# Patient Record
Sex: Female | Born: 1955 | Race: White | Hispanic: No | Marital: Married | Smoking: Never smoker
Health system: Southern US, Community
[De-identification: ages and names within clinical notes are randomized; demographics above are authoritative.]

## PROBLEM LIST (undated history)

## (undated) DIAGNOSIS — Z9889 Other specified postprocedural states: Secondary | ICD-10-CM

## (undated) DIAGNOSIS — Z9221 Personal history of antineoplastic chemotherapy: Secondary | ICD-10-CM

## (undated) DIAGNOSIS — Z923 Personal history of irradiation: Secondary | ICD-10-CM

## (undated) DIAGNOSIS — N840 Polyp of corpus uteri: Secondary | ICD-10-CM

## (undated) DIAGNOSIS — C801 Malignant (primary) neoplasm, unspecified: Secondary | ICD-10-CM

## (undated) DIAGNOSIS — J302 Other seasonal allergic rhinitis: Secondary | ICD-10-CM

## (undated) DIAGNOSIS — R112 Nausea with vomiting, unspecified: Secondary | ICD-10-CM

## (undated) DIAGNOSIS — N870 Mild cervical dysplasia: Secondary | ICD-10-CM

## (undated) DIAGNOSIS — F419 Anxiety disorder, unspecified: Secondary | ICD-10-CM

## (undated) HISTORY — PX: FOOT SURGERY: SHX648

## (undated) HISTORY — DX: Other seasonal allergic rhinitis: J30.2

## (undated) HISTORY — PX: CARPAL TUNNEL RELEASE: SHX101

## (undated) HISTORY — PX: BREAST LUMPECTOMY: SHX2

## (undated) HISTORY — DX: Mild cervical dysplasia: N87.0

## (undated) HISTORY — PX: TUBAL LIGATION: SHX77

## (undated) HISTORY — DX: Anxiety disorder, unspecified: F41.9

## (undated) HISTORY — PX: ECTOPIC PREGNANCY SURGERY: SHX613

## (undated) HISTORY — DX: Polyp of corpus uteri: N84.0

---

## 1997-12-08 ENCOUNTER — Inpatient Hospital Stay (HOSPITAL_COMMUNITY): Admission: AD | Admit: 1997-12-08 | Discharge: 1997-12-08 | Payer: Self-pay | Admitting: *Deleted

## 1997-12-14 ENCOUNTER — Inpatient Hospital Stay (HOSPITAL_COMMUNITY): Admission: AD | Admit: 1997-12-14 | Discharge: 1997-12-14 | Payer: Self-pay | Admitting: Obstetrics and Gynecology

## 1997-12-15 ENCOUNTER — Inpatient Hospital Stay (HOSPITAL_COMMUNITY): Admission: AD | Admit: 1997-12-15 | Discharge: 1997-12-15 | Payer: Self-pay | Admitting: Obstetrics and Gynecology

## 1997-12-21 ENCOUNTER — Ambulatory Visit (HOSPITAL_COMMUNITY): Admission: RE | Admit: 1997-12-21 | Discharge: 1997-12-21 | Payer: Self-pay | Admitting: Obstetrics and Gynecology

## 1998-01-04 ENCOUNTER — Other Ambulatory Visit: Admission: RE | Admit: 1998-01-04 | Discharge: 1998-01-04 | Payer: Self-pay | Admitting: Obstetrics and Gynecology

## 1998-05-25 ENCOUNTER — Ambulatory Visit (HOSPITAL_COMMUNITY): Admission: RE | Admit: 1998-05-25 | Discharge: 1998-05-25 | Payer: Self-pay

## 1998-12-09 ENCOUNTER — Other Ambulatory Visit: Admission: RE | Admit: 1998-12-09 | Discharge: 1998-12-09 | Payer: Self-pay | Admitting: Gynecology

## 2000-01-05 ENCOUNTER — Other Ambulatory Visit: Admission: RE | Admit: 2000-01-05 | Discharge: 2000-01-05 | Payer: Self-pay | Admitting: Obstetrics and Gynecology

## 2000-01-16 ENCOUNTER — Ambulatory Visit (HOSPITAL_COMMUNITY): Admission: RE | Admit: 2000-01-16 | Discharge: 2000-01-16 | Payer: Self-pay | Admitting: Obstetrics and Gynecology

## 2000-01-16 ENCOUNTER — Encounter: Payer: Self-pay | Admitting: Obstetrics and Gynecology

## 2000-03-19 ENCOUNTER — Other Ambulatory Visit: Admission: RE | Admit: 2000-03-19 | Discharge: 2000-03-19 | Payer: Self-pay | Admitting: Obstetrics and Gynecology

## 2001-01-21 ENCOUNTER — Other Ambulatory Visit: Admission: RE | Admit: 2001-01-21 | Discharge: 2001-01-21 | Payer: Self-pay | Admitting: Obstetrics and Gynecology

## 2001-09-03 HISTORY — PX: CERVICAL BIOPSY  W/ LOOP ELECTRODE EXCISION: SUR135

## 2002-07-15 ENCOUNTER — Other Ambulatory Visit: Admission: RE | Admit: 2002-07-15 | Discharge: 2002-07-15 | Payer: Self-pay | Admitting: Obstetrics and Gynecology

## 2002-12-03 ENCOUNTER — Other Ambulatory Visit: Admission: RE | Admit: 2002-12-03 | Discharge: 2002-12-03 | Payer: Self-pay | Admitting: Obstetrics and Gynecology

## 2003-07-07 ENCOUNTER — Ambulatory Visit (HOSPITAL_COMMUNITY): Admission: RE | Admit: 2003-07-07 | Discharge: 2003-07-07 | Payer: Self-pay | Admitting: Obstetrics and Gynecology

## 2003-11-11 ENCOUNTER — Other Ambulatory Visit: Admission: RE | Admit: 2003-11-11 | Discharge: 2003-11-11 | Payer: Self-pay | Admitting: Obstetrics and Gynecology

## 2004-11-14 ENCOUNTER — Other Ambulatory Visit: Admission: RE | Admit: 2004-11-14 | Discharge: 2004-11-14 | Payer: Self-pay | Admitting: Obstetrics and Gynecology

## 2005-12-11 ENCOUNTER — Other Ambulatory Visit: Admission: RE | Admit: 2005-12-11 | Discharge: 2005-12-11 | Payer: Self-pay | Admitting: Obstetrics and Gynecology

## 2007-01-31 ENCOUNTER — Other Ambulatory Visit: Admission: RE | Admit: 2007-01-31 | Discharge: 2007-01-31 | Payer: Self-pay | Admitting: Obstetrics and Gynecology

## 2008-07-15 ENCOUNTER — Ambulatory Visit: Payer: Self-pay | Admitting: Obstetrics and Gynecology

## 2008-07-15 ENCOUNTER — Encounter: Payer: Self-pay | Admitting: Obstetrics and Gynecology

## 2008-07-15 ENCOUNTER — Other Ambulatory Visit: Admission: RE | Admit: 2008-07-15 | Discharge: 2008-07-15 | Payer: Self-pay | Admitting: Obstetrics and Gynecology

## 2008-07-21 ENCOUNTER — Ambulatory Visit: Payer: Self-pay | Admitting: Obstetrics and Gynecology

## 2009-09-03 HISTORY — PX: DILATION AND CURETTAGE OF UTERUS: SHX78

## 2009-09-03 HISTORY — PX: HYSTEROSCOPY: SHX211

## 2009-11-02 ENCOUNTER — Other Ambulatory Visit: Admission: RE | Admit: 2009-11-02 | Discharge: 2009-11-02 | Payer: Self-pay | Admitting: Obstetrics and Gynecology

## 2009-11-02 ENCOUNTER — Ambulatory Visit: Payer: Self-pay | Admitting: Obstetrics and Gynecology

## 2009-12-26 ENCOUNTER — Ambulatory Visit: Payer: Self-pay | Admitting: Gynecology

## 2010-01-05 ENCOUNTER — Ambulatory Visit: Payer: Self-pay | Admitting: Obstetrics and Gynecology

## 2010-01-13 ENCOUNTER — Ambulatory Visit: Payer: Self-pay | Admitting: Obstetrics and Gynecology

## 2010-01-17 ENCOUNTER — Ambulatory Visit: Payer: Self-pay | Admitting: Obstetrics and Gynecology

## 2010-01-17 ENCOUNTER — Ambulatory Visit (HOSPITAL_BASED_OUTPATIENT_CLINIC_OR_DEPARTMENT_OTHER): Admission: RE | Admit: 2010-01-17 | Discharge: 2010-01-17 | Payer: Self-pay | Admitting: Obstetrics and Gynecology

## 2010-01-25 ENCOUNTER — Ambulatory Visit: Payer: Self-pay | Admitting: Obstetrics and Gynecology

## 2010-09-03 HISTORY — PX: VAGINAL HYSTERECTOMY: SUR661

## 2010-09-05 ENCOUNTER — Ambulatory Visit: Admit: 2010-09-05 | Payer: Self-pay | Admitting: Obstetrics and Gynecology

## 2010-09-11 ENCOUNTER — Encounter: Payer: Self-pay | Admitting: Family Medicine

## 2010-09-11 ENCOUNTER — Ambulatory Visit
Admission: RE | Admit: 2010-09-11 | Discharge: 2010-09-11 | Payer: Self-pay | Source: Home / Self Care | Attending: Obstetrics and Gynecology | Admitting: Obstetrics and Gynecology

## 2010-09-12 ENCOUNTER — Ambulatory Visit
Admission: RE | Admit: 2010-09-12 | Discharge: 2010-09-12 | Payer: Self-pay | Source: Home / Self Care | Attending: Obstetrics and Gynecology | Admitting: Obstetrics and Gynecology

## 2010-09-14 ENCOUNTER — Ambulatory Visit
Admission: RE | Admit: 2010-09-14 | Discharge: 2010-09-14 | Payer: Self-pay | Source: Home / Self Care | Attending: Obstetrics and Gynecology | Admitting: Obstetrics and Gynecology

## 2010-09-21 ENCOUNTER — Ambulatory Visit
Admission: RE | Admit: 2010-09-21 | Discharge: 2010-09-21 | Payer: Self-pay | Source: Home / Self Care | Attending: Obstetrics and Gynecology | Admitting: Obstetrics and Gynecology

## 2011-01-31 ENCOUNTER — Ambulatory Visit (INDEPENDENT_AMBULATORY_CARE_PROVIDER_SITE_OTHER): Payer: BC Managed Care – PPO | Admitting: Obstetrics and Gynecology

## 2011-01-31 DIAGNOSIS — N951 Menopausal and female climacteric states: Secondary | ICD-10-CM

## 2011-03-01 ENCOUNTER — Ambulatory Visit (INDEPENDENT_AMBULATORY_CARE_PROVIDER_SITE_OTHER): Payer: BC Managed Care – PPO | Admitting: Obstetrics and Gynecology

## 2011-03-01 ENCOUNTER — Other Ambulatory Visit: Payer: Self-pay | Admitting: Obstetrics and Gynecology

## 2011-03-01 DIAGNOSIS — N950 Postmenopausal bleeding: Secondary | ICD-10-CM

## 2011-03-15 ENCOUNTER — Institutional Professional Consult (permissible substitution) (INDEPENDENT_AMBULATORY_CARE_PROVIDER_SITE_OTHER): Payer: BC Managed Care – PPO | Admitting: Obstetrics and Gynecology

## 2011-03-15 DIAGNOSIS — N949 Unspecified condition associated with female genital organs and menstrual cycle: Secondary | ICD-10-CM

## 2011-03-15 DIAGNOSIS — R823 Hemoglobinuria: Secondary | ICD-10-CM

## 2011-03-15 DIAGNOSIS — N938 Other specified abnormal uterine and vaginal bleeding: Secondary | ICD-10-CM

## 2011-03-15 DIAGNOSIS — Z01818 Encounter for other preprocedural examination: Secondary | ICD-10-CM

## 2011-03-26 ENCOUNTER — Other Ambulatory Visit: Payer: Self-pay | Admitting: Obstetrics and Gynecology

## 2011-03-26 DIAGNOSIS — Z01818 Encounter for other preprocedural examination: Secondary | ICD-10-CM

## 2011-03-29 ENCOUNTER — Telehealth: Payer: Self-pay

## 2011-03-29 NOTE — Telephone Encounter (Signed)
Nurse Jasmine December at [facility] called to check if okay with Dr. Reece Agar that patient's preoperative labs were done here at [facility] on 03/15/11 and surgery is 04/03/11. She asked if he would want anything else done.  I told her since no pregnancy test ordered (pt with tubal ligation), pt. Is healthy and labs looked fine that I did think Dr. Reece Agar would be fine with that.  She said anesthesia was fine with it and did not need any other labs done. I told her that I would leave a note for him for Monday.  No need to call her back if all okay.

## 2011-04-03 ENCOUNTER — Other Ambulatory Visit: Payer: Self-pay | Admitting: Obstetrics and Gynecology

## 2011-04-03 ENCOUNTER — Ambulatory Visit (HOSPITAL_BASED_OUTPATIENT_CLINIC_OR_DEPARTMENT_OTHER)
Admission: RE | Admit: 2011-04-03 | Discharge: 2011-04-04 | Disposition: A | Discharge status: Home / Self Care | Payer: BC Managed Care – PPO | Source: Ambulatory Visit | Attending: Obstetrics and Gynecology | Admitting: Obstetrics and Gynecology

## 2011-04-03 DIAGNOSIS — N920 Excessive and frequent menstruation with regular cycle: Secondary | ICD-10-CM | POA: Insufficient documentation

## 2011-04-03 DIAGNOSIS — N8500 Endometrial hyperplasia, unspecified: Secondary | ICD-10-CM | POA: Insufficient documentation

## 2011-04-03 DIAGNOSIS — N8000 Endometriosis of the uterus, unspecified: Secondary | ICD-10-CM | POA: Insufficient documentation

## 2011-04-03 DIAGNOSIS — N921 Excessive and frequent menstruation with irregular cycle: Secondary | ICD-10-CM

## 2011-04-03 DIAGNOSIS — Z79890 Hormone replacement therapy (postmenopausal): Secondary | ICD-10-CM | POA: Insufficient documentation

## 2011-04-05 NOTE — Op Note (Signed)
  Susan Benjamin, Susan Benjamin               ACCOUNT NO.:  [id number]  MEDICAL RECORD NO.:  [id number]  LOCATION:                                 FACILITY:  PHYSICIAN:  Lashun Mccants L. Travaughn Vue, M.D.DATE OF BIRTH:  [birth year]  DATE OF PROCEDURE:  04/03/2011 DATE OF DISCHARGE:                              OPERATIVE REPORT   PREOPERATIVE DIAGNOSIS:  Menometrorrhagia, probable adenomyosis  POSTOPERATIVE DIAGNOSIS:  Menometrorrhagia, probable adenomyosis  OPERATIONS:  Vaginal hysterectomy.  SURGEON:  Craven Crean L. Eda Paschal, MD  FIRST ASSISTANT:  Timothy P. Fontaine, MD  INDICATIONS:  The patient is a gravida 5, para 2, AB 3, who had presented with severe menometrorrhagia.  She had failed all conservative attempts to stop it and at this point she went through menopause and therefore the bleeding stopped, but when she went on hormone replacement therapy, she now is having uncontrollable bleeding again.  She enters the hospital for removal of her uterus but she will preserve her ovaries and tubes after informed consent.  FINDINGS:  The uterus was slightly enlarged.  No obvious pathology was seen.  Ovaries, fallopian tubes, and pelvic peritoneum were free of disease.  PROCEDURE:  After adequate general anesthesia, the patient was placed in the dorsal lithotomy position, prepped and draped in usual sterile manner.  A 1:200,000 solution of epinephrine and 0.5% Xylocaine was injected around the cervix.  A 360-degree incision was made around the cervix.  The bladder was mobilized superiorly as was the posterior peritoneum.  The posterior peritoneum was entered with sharp dissection without trauma.  The uterosacral ligaments were clamped, cut and suture ligated with #1 chromic catgut and in doing this, they were shortened and they were sutured to the vault laterally for good vault support.  At this point, all other pedicles were handled with Super Jaw EnSeal. These included the cardinal ligaments,  uterine arteries.  At this point, the vesicouterine fold of peritoneum was sharply incised and ithen the balance of the broad ligament, utero-ovarian ligament, round ligaments and fallopian tubes were successively cauterized and cut with the Super Jaw Enseal.  The uterus was delivered, sent to Pathology for tissue diagnosis.  The vaginal cuff was whip stitched with a running locking 0 Vicryl.  Two sponge, needle, instrument counts were correct.  There was no bleeding noted.  The vaginal cuff was then closed with #1 chromic catgut incorporating peritoneum in such a fashion to eliminate the cul- de-sac.  During this procedure, Dr. Eda Paschal got stuck by a needle from the patient from the scrub nurse and therefore blood will be drawn for exposure.  The cuff was completely closed.  Copious irrigation was done once more with Ringer's lactate.  There was no bleeding noted. Estimated blood loss for entire procedure was less than 50 cc with none replaced.  The patient left the operating room in satisfactory condition draining clear urine from her Foley catheter.     Osmara Drummonds L. Eda Paschal, M.D.     Tonette Bihari  D:  04/03/2011  T:  04/03/2011  Job:  [id number]  Electronically Signed by Edyth Gunnels M.D. on 04/05/2011 11:17:10 AM

## 2011-04-05 NOTE — H&P (Signed)
NAMEARRON, MCNAUGHT               ACCOUNT NO.:  [id number]  MEDICAL RECORD NO.:  [id number]  LOCATION:                                 FACILITY:  PHYSICIAN:  Daniel L. Gottsegen, M.D.DATE OF BIRTH:  07-02-1956  DATE OF ADMISSION:  04/03/2011 DATE OF DISCHARGE:                             HISTORY & PHYSICAL   She is for surgery tomorrow at [facility].  CHIEF COMPLAINT:  Menorrhagia.  HISTORY OF PRESENT ILLNESS:  The patient is a 54 year old gravida 5, para 2-0-3-2 who has had a long several-year history of menometrorrhagia.  It is so bad that she has to stay at home when she bleeds.  It is associated with a lot of cramping.  She underwent a D and C hysteroscopy in May 2011 with removal of some small endometrial polyps, but her problem persisted in spite of that probably because of adenomyosis of the uterus.  She was really almost at the point of going ahead with hysterectomy when she started menopause and obviously this resolved her problem temporarily.  She however is having severe vasomotor symptoms and when hormone replacement therapy was started, the menometrorrhagia has recurred and as a result of this she now enters the hospital for vaginal hysterectomy.  She does not want to have her ovaries removed.  She understands the benefits and the risks. She also does have a history of simple endometrial hyperplasia, which is I think part of the issue as well.  She now enters the hospital for the above.  PAST MEDICAL HISTORY:  The patient has previously had a laparoscopic tubal.  She has also had a tubal reversal.  She had 2 ectopic pregnancies.  She had carpal tunnel surgery and she last had a D and C hysteroscopy in May 2011.  CURRENT MEDICATIONS:  Prempro.  ALLERGIES:  None.  FAMILY HISTORY:  Reveals a family history of diabetes with her mother. Her mother is also hypertensive and has had a stroke.  Her father is hypertensive as well.  Her mother has also  had coronary artery disease.  SOCIAL HISTORY:  She is a nonsmoker, nondrinker.  REVIEW OF SYSTEMS:  HEENT:  Negative.  CARDIAC:  Negative.  RESPIRATORY: Negative.  GI:  Negative.  GU:  Negative.  PHYSICAL EXAMINATION:  GENERAL:  The patient is a well-developed, well- nourished female, in no acute distress. VITAL SIGNS:  Blood pressure is 130/80, pulse is 80 and regular, respirations 16 and nonlabored. HEENT:  Within normal limits. NECK:  Supple.  Trachea in the midline.  Thyroid is not enlarged. LUNGS:  Clear to P and A. HEART:  No thrills, heaves, or murmurs. BREASTS:  No masses. ABDOMEN:  Soft without guarding, rebound or masses. PELVIC:  External is normal.  BUS is normal.  Vagina is normal.  Cervix is clean.  Pap smear shows no atypia.  Uterus is normal size and shape. Adnexa has failed to reveal masses.  Rectovaginal is confirmatory. EXTREMITIES:  Within normal limits.  ADMISSION IMPRESSION:  Menometrorrhagia, endometrial hyperplasia, adenomyosis.  PLAN:  Vaginal hysterectomy.     Daniel L. Eda Paschal, M.D.     Tonette Bihari  D:  04/02/2011  T:  04/02/2011  Job:  [id number]  cc:   [facility]  Electronically Signed by Edyth Gunnels M.D. on 04/05/2011 11:15:02 AM

## 2011-04-23 ENCOUNTER — Encounter: Payer: Self-pay | Admitting: Gynecology

## 2011-04-23 DIAGNOSIS — N840 Polyp of corpus uteri: Secondary | ICD-10-CM | POA: Insufficient documentation

## 2011-04-23 DIAGNOSIS — N870 Mild cervical dysplasia: Secondary | ICD-10-CM | POA: Insufficient documentation

## 2011-05-02 ENCOUNTER — Encounter: Payer: Self-pay | Admitting: Obstetrics and Gynecology

## 2011-05-02 ENCOUNTER — Ambulatory Visit (INDEPENDENT_AMBULATORY_CARE_PROVIDER_SITE_OTHER): Payer: BC Managed Care – PPO | Admitting: Obstetrics and Gynecology

## 2011-05-02 DIAGNOSIS — N951 Menopausal and female climacteric states: Secondary | ICD-10-CM

## 2011-05-02 DIAGNOSIS — N8003 Adenomyosis of the uterus: Secondary | ICD-10-CM | POA: Insufficient documentation

## 2011-05-02 DIAGNOSIS — N8000 Endometriosis of the uterus, unspecified: Secondary | ICD-10-CM

## 2011-05-02 DIAGNOSIS — Z780 Asymptomatic menopausal state: Secondary | ICD-10-CM

## 2011-05-02 MED ORDER — ALPRAZOLAM 0.25 MG PO TABS: 0.2500 mg | ORAL_TABLET | Freq: Every evening | ORAL | Status: AC | PRN

## 2011-05-02 NOTE — Progress Notes (Signed)
Patient came to see me today for postoperative visit. She is feeling good except for little bit of vaginal discharge. Her pathology report revealed adenomyosis.  External: Within normal limits vaginal: Cuff healing well but not 100% healed. Bimanual exam within normal limits  Assessment: Normal postoperative course.  Plan: Patient reassured. RTO one month. Patient having some irritability and she was treated with Xanax 0.25 mg one when necessary.

## 2011-05-30 ENCOUNTER — Ambulatory Visit (INDEPENDENT_AMBULATORY_CARE_PROVIDER_SITE_OTHER): Payer: BC Managed Care – PPO | Admitting: Obstetrics and Gynecology

## 2011-05-30 DIAGNOSIS — N949 Unspecified condition associated with female genital organs and menstrual cycle: Secondary | ICD-10-CM

## 2011-05-30 DIAGNOSIS — N938 Other specified abnormal uterine and vaginal bleeding: Secondary | ICD-10-CM

## 2011-05-30 NOTE — Progress Notes (Signed)
When we called the patient she came right over and we did see her on her appointment day. She is completely healed from her surgery and has no complaints. She is way overdue for mammogram however. She remains on her estrogen with good results.  External within normal limits. Vaginal examination shows 2 mm of granulation tissue. Bimanual exam is negative.  Assessment: Normal postoperative recuperation  Plan: Silver nitrate to granulation tissue. Continue estrogen. Mammogram. RTO one year recall.

## 2011-05-30 NOTE — Progress Notes (Signed)
Patient did not keep her appointment. She will be called and will be rescheduled.

## 2012-02-27 ENCOUNTER — Other Ambulatory Visit: Payer: Self-pay | Admitting: Dermatology

## 2012-03-10 ENCOUNTER — Other Ambulatory Visit: Payer: Self-pay | Admitting: Obstetrics and Gynecology

## 2012-03-10 DIAGNOSIS — Z1231 Encounter for screening mammogram for malignant neoplasm of breast: Secondary | ICD-10-CM

## 2012-04-01 ENCOUNTER — Ambulatory Visit (HOSPITAL_COMMUNITY): Payer: BC Managed Care – PPO

## 2012-04-16 ENCOUNTER — Ambulatory Visit (HOSPITAL_COMMUNITY)
Admission: RE | Admit: 2012-04-16 | Discharge: 2012-04-16 | Disposition: A | Discharge status: Home / Self Care | Payer: BC Managed Care – PPO | Source: Ambulatory Visit | Attending: Obstetrics and Gynecology | Admitting: Obstetrics and Gynecology

## 2012-04-16 DIAGNOSIS — Z1231 Encounter for screening mammogram for malignant neoplasm of breast: Secondary | ICD-10-CM | POA: Insufficient documentation

## 2012-10-10 ENCOUNTER — Ambulatory Visit (INDEPENDENT_AMBULATORY_CARE_PROVIDER_SITE_OTHER): Payer: BC Managed Care – PPO | Admitting: Women's Health

## 2012-10-10 ENCOUNTER — Encounter: Payer: Self-pay | Admitting: Women's Health

## 2012-10-10 VITALS — BP 120/70 | Ht 63.75 in | Wt 162.0 lb

## 2012-10-10 DIAGNOSIS — D126 Benign neoplasm of colon, unspecified: Secondary | ICD-10-CM

## 2012-10-10 DIAGNOSIS — N870 Mild cervical dysplasia: Secondary | ICD-10-CM

## 2012-10-10 DIAGNOSIS — Z780 Asymptomatic menopausal state: Secondary | ICD-10-CM

## 2012-10-10 DIAGNOSIS — Z13820 Encounter for screening for osteoporosis: Secondary | ICD-10-CM

## 2012-10-10 DIAGNOSIS — K635 Polyp of colon: Secondary | ICD-10-CM | POA: Insufficient documentation

## 2012-10-10 DIAGNOSIS — Z01419 Encounter for gynecological examination (general) (routine) without abnormal findings: Secondary | ICD-10-CM

## 2012-10-10 DIAGNOSIS — Z23 Encounter for immunization: Secondary | ICD-10-CM

## 2012-10-10 MED ORDER — ESTRADIOL 2 MG PO TABS: 2.0000 mg | ORAL_TABLET | Freq: Every day | ORAL | Status: DC

## 2012-10-10 MED ORDER — ALPRAZOLAM 0.25 MG PO TABS: 0.2500 mg | ORAL_TABLET | Freq: Every evening | ORAL | Status: DC | PRN

## 2012-10-10 NOTE — Patient Instructions (Addendum)
Vit D 2000 daily Schedule bone density Health Recommendations for Postmenopausal Women Based on the Results of the Women's Health Initiative [facility]) and Other Studies The WHI is a major 15-year research program to address the most common causes of death, disability and poor quality of life in postmenopausal women. Some of these causes are heart disease, cancer, bone loss (osteoporosis) and others. Taking into account all of the findings from [facility] and other studies, here are bottom-line health recommendations for women: CARDIOVASCULAR DISEASE Heart Disease: A heart attack is a medical emergency. Know the signs and symptoms of a heart attack. Hormone therapy should not be used to prevent heart disease. In women with heart disease, hormone therapy should not be used to prevent further disease. Hormone therapy increases the risk of blood clots. Below are things women can do to reduce their risk for heart disease.   Do not smoke. If you smoke, quit. Women who smoke are 2 to 6 times more likely to suffer a heart attack than non-smoking women.  Aim for a healthy weight. Being overweight causes many preventable deaths. Eat a healthy and balanced diet and drink an adequate amount of liquids.  Get moving. Make a commitment to be more physically active. Aim for 30 minutes of activity on most, if not all days of the week.  Eat for heart health. Choose a diet that is low in saturated fat, trans fat, and cholesterol. Include whole grains, vegetables, and fruits. Read the labels on the food container before buying it.  Know your numbers. Ask your caregiver to check your blood pressure, cholesterol (total, HDL, LDL, triglycerides) and blood glucose. Work with your caregiver to improve any numbers that are not normal.  High blood pressure. Limit or stop your table salt intake (try salt substitute and food seasonings), avoid salty foods and drinks. Read the labels on the food container before buying it. Avoid becoming  overweight by eating well and exercising. STROKE  Stroke is a medical emergency. Stroke can be the result of a blood clot in the blood vessel in the brain or by a brain hemorrhage (bleeding). Know the signs and symptoms of a stroke. To lower the risk of developing a stroke:  Avoid fatty foods.  Quit smoking.  Control your diabetes, blood pressure, and irregular heart rate. THROMBOPHLIBITIS (BLOOD CLOT) OF THE LEG  Hormone treatment is a big cause of developing blood clots in the leg. Becoming overweight and leading a stationary lifestyle also may contribute to developing blood clots. Controlling your diet and exercising will help lower the risk of developing blood clots. CANCER SCREENING  Breast Cancer: Women should take steps to reduce their risk of breast cancer. This includes having regular mammograms, monthly self breast exams and regular breast exams by your caregiver. Have a mammogram every one to two years if you are 56 to 56 years old. Have a mammogram annually if you are 56 years old or older depending on your risk factors. Women who are high risk for breast cancer may need more frequent mammograms. There are tests available (testing the genes in your body) if you have family history of breast cancer called BRCA 1 and 2. These tests can help determine the risks of developing breast cancer.  Intestinal or Stomach Cancer: Women should talk to their caregiver about when to start screening, what tests and how often they should be done, and the benefits and risks of doing these tests. Tests to consider are a rectal exam, fecal occult blood, sigmoidoscopy,  colononoscoby, barium enema and upper GI series of the stomach. Depending on the age, you may want to get a medical and family history of colon cancer. Women who are high risk may need to be screened at an earlier age and more often.  Cervical Cancer: A Pap test of the cervix should be done every year and every 3 years when there has been three  straight years of a normal Pap test. Women with an abnormal Pap test should be screened more often or have a cervical biopsy depending on your caregiver's recommendation.  Uterine Cancer: If you have vaginal bleeding after you are in the menopause, it should be evaluated by your caregiver.  Ovarian cancer: There are no reliable tests available to screen for ovarian cancer at this time except for yearly pelvic exams.  Lung Cancer: Yearly chest X-rays can detect lung cancer and should be done on high risk women, such as cigarette smokers and women with chronic lung disease (emphysemia).  Skin Cancer: A complete body skin exam should be done at your yearly examination. Avoid overexposure to the sun and ultraviolet light lamps. Use a strong sun block cream when in the sun. All of these things are important in lowering the risk of skin cancer. MENOPAUSE Menopause Symptoms: Hormone therapy products are effective for treating symptoms associated with menopause:  Moderate to severe hot flashes.  Night sweats.  Mood swings.  Headaches.  Tiredness.  Loss of sex drive.  Insomnia.  Other symptoms. However, hormone therapy products carry serious risks, especially in older women. Women who use or are thinking about using estrogen or estrogen with progestin treatments should discuss that with their caregiver. Your caregiver will know if the benefits outweigh the risks. The Food and Drug Administration (FDA) has concluded that hormone therapy should be used only at the lowest doses and for the shortest amount of time to reach treatment goals. It is not known at what doses there may be less risk of serious side effects. There are other treatments such as herbal medication (not controlled or regulated by the FDA), group therapy, counseling and acupuncture that may be helpful. OSTEOPOROSIS Protecting Against Bone Loss and Preventing Fracture: If hormone therapy is used for prevention of bone loss  (osteoporosis), the risks for bone loss must outweigh the risk of the therapy. Women considering taking hormone therapy for bone loss should ask their health care providers about other medications (fosamax and boniva) that are considered safe and effective for preventing bone loss and bone fractures. To guard against bone loss or fractures, it is recommended that women should take at least 1000-1500 mg of calcium and 400-800 IU of vitamin D daily in divided doses. Smoking and excessive alcohol intake increases the risk of osteoporosis. Eat foods rich in calcium and vitamin D and do weight bearing exercises several times a week as your caregiver suggests. DIABETES Diabetes Melitus: Women with Type I or Type 2 diabetes should keep their diabetes in control with diet, exercise and medication. Avoid too many sweets, starchy and fatty foods. Being overweight can affect your diabetes. COGNITION AND MEMORY Cognition and Memory: Menopausal hormone therapy is not recommended for the prevention of cognitive disorders such as Alzheimer's disease or memory loss. WHI found that women treated with hormone therapy have a greater risk of developing dementia.  DEPRESSION  Depression may occur at any age, but is common in elderly women. The reasons may be because of physical, medical, social (loneliness), financial and/or economic problems and needs. Becoming  involved with church, volunteer or social groups, seeking treatment for any physical or medical problems is recommended. Also, look into getting professional advice for any economic or financial problems. ACCIDENTS  Accidents are common and can be serious in the elderly woman. Prepare your house to prevent accidents. Eliminate throw rugs, use hip protectors, place hand bars in the bath, shower and toilet areas. Avoid wearing high heel shoes and walking on wet, snowy and icy areas. Stop driving if you have vision, hearing problems or are unsteady with you movements and  reflexes. RHEUMATOID ARTHRITIS Rheumatoid arthritis causes pain, swelling and stiffness of your bone joints. It can limit many of your activities. Over-the-counter medications may help, but prescription medications may be necessary. Talk with your caregiver about this. Exercise (walking, water aerobics), good posture, using splints on painful joints, warm baths or applying warm compresses to stiff joints and cold compresses to painful joints may be helpful. Smoking and excessive drinking may worsen the symptoms of arthritis. Seek help from a physical therapist if the arthritis is becoming a problem with your daily activities. IMMUNIZATIONS  Several immunizations are important to have during your senior years, including:   Tetanus and a diptheria shot booster every 10 years.  Influenza every year before the flu season begins.  Pneumonia vaccine.  Shingles vaccine.  Others as indicated (example: H1N1 vaccine). Document Released: 10/12/2005 Document Revised: 11/12/2011 Document Reviewed: 06/07/2008 [facility] [place] [facility] Patient Information 2013 [place], [place].

## 2012-10-10 NOTE — Progress Notes (Signed)
Susan Benjamin [birth year] [id number]    History:    The patient presents for annual exam.  Vaginal hysterectomy 2012/DUB on estradiol 2 mg daily. Had a negative polyp/ colonoscopy 2011. History of normal mammograms.  LEEP 2003 with normal Paps after.   Past medical history, past surgical history, family history and social history were all reviewed and documented in the EPIC chart. Works at an [employer] in the office.  Susan Benjamin 41 healthy,  Susan Benjamin 37 healthy, 3 grandchildren. Mother diabetes, hypertension, heart disease. Father hypertension-parents died within months of each other 2 years ago. Helps care for her sister Susan Benjamin with mild mental retardation.   ROS:  A  ROS was performed and pertinent positives and negatives are included in the history.  Exam:  Filed Vitals:   10/10/12 1438  BP: 120/70    General appearance:  Normal Head/Neck:  Normal, without cervical or supraclavicular adenopathy. Thyroid:  Symmetrical, normal in size, without palpable masses or nodularity. Respiratory  Effort:  Normal  Auscultation:  Clear without wheezing or rhonchi Cardiovascular  Auscultation:  Regular rate, without rubs, murmurs or gallops  Edema/varicosities:  Not grossly evident Abdominal  Soft,nontender, without masses, guarding or rebound.  Liver/spleen:  No organomegaly noted  Hernia:  None appreciated  Skin  Inspection:  Grossly normal  Palpation:  Grossly normal Neurologic/psychiatric  Orientation:  Normal with appropriate conversation.  Mood/affect:  Normal  Genitourinary    Breasts: Examined lying and sitting.     Right: Without masses, retractions, discharge or axillary adenopathy.     Left: Without masses, retractions, discharge or axillary adenopathy.   Inguinal/mons:  Normal without inguinal adenopathy  External genitalia:  Normal  BUS/Urethra/Skene's glands:  Normal  Bladder:  Normal  Vagina:  Normal  Cervix:  Absent  Uterus:  Absent  Adnexa/parametria:      Rt: Without masses or tenderness.   Lt: Without masses or tenderness.  Anus and perineum: Normal  Digital rectal exam: Normal sphincter tone without palpated masses or tenderness  Assessment/Plan:  57 y.o. M. WF G5 P2 for annual exam with no complaints.  Postmenopausal on estradiol Colon polyp benign 2011 Labs at primary care  Plan: Home Hemoccult card given with instructions. SBE's, continue annual mammogram, normal 04/2012, increase regular exercise, calcium rich diet, vitamin D 2000 daily encouraged. Has not had a bone density, instructed to schedule here. Estradiol 2 mg daily, prescription, reviewed risks of blood clots, strokes, breast cancer and accepts. States numerous hot flushes, poor sleep and generally not feeling well when off. Reviewed will decrease to 1 mg next year. Pap screening reviewed. Situational stressors,  Xanax 0.25 prescription given #30 with 1 refill, aware of addictive properties and uses sparingly.    Harrington Challenger [facility], 5:28 PM 10/10/2012

## 2012-10-14 ENCOUNTER — Encounter: Payer: Self-pay | Admitting: Gynecology

## 2012-11-03 ENCOUNTER — Other Ambulatory Visit: Payer: Self-pay | Admitting: Gynecology

## 2012-11-03 DIAGNOSIS — Z13820 Encounter for screening for osteoporosis: Secondary | ICD-10-CM

## 2012-12-04 ENCOUNTER — Other Ambulatory Visit: Payer: Self-pay | Admitting: Dermatology

## 2013-04-23 ENCOUNTER — Other Ambulatory Visit: Payer: Self-pay | Admitting: Dermatology

## 2013-05-11 ENCOUNTER — Other Ambulatory Visit: Payer: Self-pay

## 2013-05-11 DIAGNOSIS — Z780 Asymptomatic menopausal state: Secondary | ICD-10-CM

## 2013-05-11 MED ORDER — ALPRAZOLAM 0.25 MG PO TABS: 0.2500 mg | ORAL_TABLET | Freq: Every evening | ORAL | Status: DC | PRN

## 2013-05-11 NOTE — Telephone Encounter (Signed)
Called into pharmacy

## 2013-09-07 ENCOUNTER — Other Ambulatory Visit: Payer: Self-pay | Admitting: Women's Health

## 2013-09-07 DIAGNOSIS — Z1231 Encounter for screening mammogram for malignant neoplasm of breast: Secondary | ICD-10-CM

## 2013-09-24 ENCOUNTER — Ambulatory Visit (HOSPITAL_COMMUNITY)
Admission: RE | Admit: 2013-09-24 | Discharge: 2013-09-24 | Disposition: A | Discharge status: Home / Self Care | Payer: BC Managed Care – PPO | Source: Ambulatory Visit | Attending: Women's Health | Admitting: Women's Health

## 2013-09-24 ENCOUNTER — Ambulatory Visit (HOSPITAL_COMMUNITY)
Admission: RE | Admit: 2013-09-24 | Discharge: 2013-09-24 | Disposition: A | Discharge status: Home / Self Care | Payer: BC Managed Care – PPO | Source: Ambulatory Visit | Attending: Gynecology | Admitting: Gynecology

## 2013-09-24 DIAGNOSIS — Z1231 Encounter for screening mammogram for malignant neoplasm of breast: Secondary | ICD-10-CM | POA: Insufficient documentation

## 2013-09-24 DIAGNOSIS — Z13820 Encounter for screening for osteoporosis: Secondary | ICD-10-CM | POA: Insufficient documentation

## 2013-09-24 DIAGNOSIS — Z780 Asymptomatic menopausal state: Secondary | ICD-10-CM | POA: Insufficient documentation

## 2013-10-12 ENCOUNTER — Encounter: Payer: BC Managed Care – PPO | Admitting: Women's Health

## 2013-10-13 ENCOUNTER — Ambulatory Visit (INDEPENDENT_AMBULATORY_CARE_PROVIDER_SITE_OTHER): Payer: BC Managed Care – PPO | Admitting: Women's Health

## 2013-10-13 ENCOUNTER — Encounter: Payer: Self-pay | Admitting: Women's Health

## 2013-10-13 VITALS — BP 128/80 | Ht 63.75 in | Wt 167.4 lb

## 2013-10-13 DIAGNOSIS — Z01419 Encounter for gynecological examination (general) (routine) without abnormal findings: Secondary | ICD-10-CM

## 2013-10-13 DIAGNOSIS — Z79890 Hormone replacement therapy (postmenopausal): Secondary | ICD-10-CM

## 2013-10-13 DIAGNOSIS — N951 Menopausal and female climacteric states: Secondary | ICD-10-CM

## 2013-10-13 DIAGNOSIS — Z780 Asymptomatic menopausal state: Secondary | ICD-10-CM

## 2013-10-13 MED ORDER — ESTRADIOL 1 MG PO TABS: 1.0000 mg | ORAL_TABLET | Freq: Every day | ORAL | Status: DC

## 2013-10-13 MED ORDER — ALPRAZOLAM 0.25 MG PO TABS: 0.2500 mg | ORAL_TABLET | Freq: Every evening | ORAL | Status: DC | PRN

## 2013-10-13 NOTE — Progress Notes (Signed)
Susan Benjamin [birth year] [id number]    History:    Presents for annual exam.  2012 [facility] for DUB on estradiol 2 mg and recently stopped and is now having hot flushes, feeling anxious and poor sleep. 09/2013 DEXA  T score 1.7 at spine 0.0 hip. 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 Normal mammogram history 2011 colonoscopy negative polyp. Rare Xanax use.  Past medical history, past surgical history, family history and social history were all reviewed and documented in the EPIC chart. Works at [employer]. Daughter 4 graduating from nursing school, son 7 both doing well. Mother diabetes, hypertension, heart disease. Father hypertension and liver cancer both died 3 years ago within a month.  ROS:  A  ROS was performed and pertinent positives and negatives are included.  Exam:  Filed Vitals:   10/13/13 0848  BP: 128/80    General appearance:  Normal Thyroid:  Symmetrical, normal in size, without palpable masses or nodularity. Respiratory  Auscultation:  Clear without wheezing or rhonchi Cardiovascular  Auscultation:  Regular rate, without rubs, murmurs or gallops  Edema/varicosities:  Not grossly evident Abdominal  Soft,nontender, without masses, guarding or rebound.  Liver/spleen:  No organomegaly noted  Hernia:  None appreciated  Skin  Inspection:  Grossly normal   Breasts: Examined lying and sitting.     Right: Without masses, retractions, discharge or axillary adenopathy.     Left: Without masses, retractions, discharge or axillary adenopathy. Gentitourinary   Inguinal/mons:  Normal without inguinal adenopathy  External genitalia:  Normal  BUS/Urethra/Skene's glands:  Normal  Vagina:  Normal  Cervix: Absent  Uterus: Absent  Adnexa/parametria:     Rt: Without masses or tenderness.   Lt: Without masses or tenderness.  Anus and perineum: Normal  Digital rectal exam: Normal sphincter tone without palpated masses or tenderness  Assessment/Plan:  58 y.o.MWF G5P2   for annual exam.    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 SBE's, continue annual mammogram, 3-D tomography reviewed history of dense breast. Increase regular exercise, calcium rich diet, vitamin D 2000 daily encouraged. Xanax 0.25  occasional anxiety and rest. Aware to use sparingly. Repeat colonoscopy in one year.  Huel Cote [facility], 9:31 AM 10/13/2013

## 2013-10-13 NOTE — Patient Instructions (Signed)
Health Recommendations for Postmenopausal Women Respected and ongoing research has looked at the most common causes of death, disability, and poor quality of life in postmenopausal women. The causes include heart disease, diseases of blood vessels, diabetes, depression, cancer, and bone loss (osteoporosis). Many things can be done to help lower the chances of developing these and other common problems: CARDIOVASCULAR DISEASE Heart Disease: A heart attack is a medical emergency. Know the signs and symptoms of a heart attack. Below are things women can do to reduce their risk for heart disease.   Do not smoke. If you smoke, quit.  Aim for a healthy weight. Being overweight causes many preventable deaths. Eat a healthy and balanced diet and drink an adequate amount of liquids.  Get moving. Make a commitment to be more physically active. Aim for 30 minutes of activity on most, if not all days of the week.  Eat for heart health. Choose a diet that is low in saturated fat and cholesterol and eliminate trans fat. Include whole grains, vegetables, and fruits. Read and understand the labels on food containers before buying.  Know your numbers. Ask your caregiver to check your blood pressure, cholesterol (total, HDL, LDL, triglycerides) and blood glucose. Work with your caregiver on improving your entire clinical picture.  High blood pressure. Limit or stop your table salt intake (try salt substitute and food seasonings). Avoid salty foods and drinks. Read labels on food containers before buying. Eating well and exercising can help control high blood pressure. STROKE  Stroke is a medical emergency. Stroke may be the result of a blood clot in a blood vessel in the brain or by a brain hemorrhage (bleeding). Know the signs and symptoms of a stroke. To lower the risk of developing a stroke:  Avoid fatty foods.  Quit smoking.  Control your diabetes, blood pressure, and irregular heart rate. THROMBOPHLEBITIS  (BLOOD CLOT) OF THE LEG  Becoming overweight and leading a stationary lifestyle may also contribute to developing blood clots. Controlling your diet and exercising will help lower the risk of developing blood clots. CANCER SCREENING  Breast Cancer: Take steps to reduce your risk of breast cancer.  You should practice "breast self-awareness." This means understanding the normal appearance and feel of your breasts and should include breast self-examination. Any changes detected, no matter how small, should be reported to your caregiver.  After age 40, you should have a clinical breast exam (CBE) every year.  Starting at age 40, you should consider having a mammogram (breast X-ray) every year.  If you have a family history of breast cancer, talk to your caregiver about genetic screening.  If you are at high risk for breast cancer, talk to your caregiver about having an MRI and a mammogram every year.  Intestinal or Stomach Cancer: Tests to consider are a rectal exam, fecal occult blood, sigmoidoscopy, and colonoscopy. Women who are high risk may need to be screened at an earlier age and more often.  Cervical Cancer:  Beginning at age 30, you should have a Pap test every 3 years as long as the past 3 Pap tests have been normal.  If you have had past treatment for cervical cancer or a condition that could lead to cancer, you need Pap tests and screening for cancer for at least 20 years after your treatment.  If you had a hysterectomy for a problem that was not cancer or a condition that could lead to cancer, then you no longer need Pap tests.    If you are between ages 65 and 70, and you have had normal Pap tests going back 10 years, you no longer need Pap tests.  If Pap tests have been discontinued, risk factors (such as a new sexual partner) need to be reassessed to determine if screening should be resumed.  Some medical problems can increase the chance of getting cervical cancer. In these  cases, your caregiver may recommend more frequent screening and Pap tests.  Uterine Cancer: If you have vaginal bleeding after reaching menopause, you should notify your caregiver.  Ovarian cancer: Other than yearly pelvic exams, there are no reliable tests available to screen for ovarian cancer at this time except for yearly pelvic exams.  Lung Cancer: Yearly chest X-rays can detect lung cancer and should be done on high risk women, such as cigarette smokers and women with chronic lung disease (emphysema).  Skin Cancer: A complete body skin exam should be done at your yearly examination. Avoid overexposure to the sun and ultraviolet light lamps. Use a strong sun block cream when in the sun. All of these things are important in lowering the risk of skin cancer. MENOPAUSE Menopause Symptoms: Hormone therapy products are effective for treating symptoms associated with menopause:  Moderate to severe hot flashes.  Night sweats.  Mood swings.  Headaches.  Tiredness.  Loss of sex drive.  Insomnia.  Other symptoms. Hormone replacement carries certain risks, especially in older women. Women who use or are thinking about using estrogen or estrogen with progestin treatments should discuss that with their caregiver. Your caregiver will help you understand the benefits and risks. The ideal dose of hormone replacement therapy is not known. The Food and Drug Administration (FDA) has concluded that hormone therapy should be used only at the lowest doses and for the shortest amount of time to reach treatment goals.  OSTEOPOROSIS Protecting Against Bone Loss and Preventing Fracture: If you use hormone therapy for prevention of bone loss (osteoporosis), the risks for bone loss must outweigh the risk of the therapy. Ask your caregiver about other medications known to be safe and effective for preventing bone loss and fractures. To guard against bone loss or fractures, the following is recommended:  If  you are less than age 50, take 1000 mg of calcium and at least 600 mg of Vitamin D per day.  If you are greater than age 50 but less than age 70, take 1200 mg of calcium and at least 600 mg of Vitamin D per day.  If you are greater than age 70, take 1200 mg of calcium and at least 800 mg of Vitamin D per day. Smoking and excessive alcohol intake increases the risk of osteoporosis. Eat foods rich in calcium and vitamin D and do weight bearing exercises several times a week as your caregiver suggests. DIABETES Diabetes Melitus: If you have Type I or Type 2 diabetes, you should keep your blood sugar under control with diet, exercise and recommended medication. Avoid too many sweets, starchy and fatty foods. Being overweight can make control more difficult. COGNITION AND MEMORY Cognition and Memory: Menopausal hormone therapy is not recommended for the prevention of cognitive disorders such as Alzheimer's disease or memory loss.  DEPRESSION  Depression may occur at any age, but is common in elderly women. The reasons may be because of physical, medical, social (loneliness), or financial problems and needs. If you are experiencing depression because of medical problems and control of symptoms, talk to your caregiver about this. Physical activity and   exercise may help with mood and sleep. Community and volunteer involvement may help your sense of value and worth. If you have depression and you feel that the problem is getting worse or becoming severe, talk to your caregiver about treatment options that are best for you. ACCIDENTS  Accidents are common and can be serious in the elderly woman. Prepare your house to prevent accidents. Eliminate throw rugs, place hand bars in the bath, shower and toilet areas. Avoid wearing high heeled shoes or walking on wet, snowy, and icy areas. Limit or stop driving if you have vision or hearing problems, or you feel you are unsteady with you movements and  reflexes. HEPATITIS C Hepatitis C is a type of viral infection affecting the liver. It is spread mainly through contact with blood from an infected person. It can be treated, but if left untreated, it can lead to severe liver damage over years. Many people who are infected do not know that the virus is in their blood. If you are a "baby-boomer", it is recommended that you have one screening test for Hepatitis C. IMMUNIZATIONS  Several immunizations are important to consider having during your senior years, including:   Tetanus, diptheria, and pertussis booster shot.  Influenza every year before the flu season begins.  Pneumonia vaccine.  Shingles vaccine.  Others as indicated based on your specific needs. Talk to your caregiver about these. Document Released: 10/12/2005 Document Revised: 08/06/2012 Document Reviewed: 06/07/2008 [employer] Patient Information 2014 [employer].  

## 2013-10-14 LAB — URINALYSIS W MICROSCOPIC + REFLEX CULTURE
BACTERIA UA: NONE SEEN
Bilirubin Urine: NEGATIVE
Casts: NONE SEEN
Crystals: NONE SEEN
Glucose, UA: NEGATIVE mg/dL
HGB URINE DIPSTICK: NEGATIVE
KETONES UR: NEGATIVE mg/dL
Leukocytes, UA: NEGATIVE
NITRITE: NEGATIVE
Protein, ur: NEGATIVE mg/dL
Specific Gravity, Urine: 1.021 (ref 1.005–1.030)
Squamous Epithelial / LPF: NONE SEEN
UROBILINOGEN UA: 0.2 mg/dL (ref 0.0–1.0)
pH: 5 (ref 5.0–8.0)

## 2014-07-05 ENCOUNTER — Encounter: Payer: Self-pay | Admitting: Women's Health

## 2014-09-25 ENCOUNTER — Emergency Department (HOSPITAL_COMMUNITY): Payer: BC Managed Care – PPO

## 2014-09-25 ENCOUNTER — Emergency Department (HOSPITAL_COMMUNITY)
Admission: EM | Admit: 2014-09-25 | Discharge: 2014-09-25 | Disposition: A | Discharge status: Home / Self Care | Payer: BC Managed Care – PPO | Attending: Emergency Medicine | Admitting: Emergency Medicine

## 2014-09-25 ENCOUNTER — Encounter (HOSPITAL_COMMUNITY): Payer: Self-pay

## 2014-09-25 DIAGNOSIS — S62112A Displaced fracture of triquetrum [cuneiform] bone, left wrist, initial encounter for closed fracture: Secondary | ICD-10-CM | POA: Diagnosis not present

## 2014-09-25 DIAGNOSIS — Y9289 Other specified places as the place of occurrence of the external cause: Secondary | ICD-10-CM | POA: Diagnosis not present

## 2014-09-25 DIAGNOSIS — Y93K1 Activity, walking an animal: Secondary | ICD-10-CM | POA: Insufficient documentation

## 2014-09-25 DIAGNOSIS — Z8541 Personal history of malignant neoplasm of cervix uteri: Secondary | ICD-10-CM | POA: Insufficient documentation

## 2014-09-25 DIAGNOSIS — Z8742 Personal history of other diseases of the female genital tract: Secondary | ICD-10-CM | POA: Insufficient documentation

## 2014-09-25 DIAGNOSIS — Z79818 Long term (current) use of other agents affecting estrogen receptors and estrogen levels: Secondary | ICD-10-CM | POA: Diagnosis not present

## 2014-09-25 DIAGNOSIS — Y998 Other external cause status: Secondary | ICD-10-CM | POA: Insufficient documentation

## 2014-09-25 DIAGNOSIS — W010XXA Fall on same level from slipping, tripping and stumbling without subsequent striking against object, initial encounter: Secondary | ICD-10-CM | POA: Diagnosis not present

## 2014-09-25 DIAGNOSIS — F419 Anxiety disorder, unspecified: Secondary | ICD-10-CM | POA: Diagnosis not present

## 2014-09-25 DIAGNOSIS — S6992XA Unspecified injury of left wrist, hand and finger(s), initial encounter: Secondary | ICD-10-CM | POA: Diagnosis present

## 2014-09-25 MED ORDER — OXYCODONE-ACETAMINOPHEN 5-325 MG PO TABS
1.0000 | ORAL_TABLET | Freq: Once | ORAL | Status: AC
  Administered 2014-09-25: 1 via ORAL
  Filled 2014-09-25: qty 1

## 2014-09-25 MED ORDER — IBUPROFEN 800 MG PO TABS: 800.0000 mg | ORAL_TABLET | Freq: Three times a day (TID) | ORAL | Status: DC

## 2014-09-25 NOTE — ED Notes (Signed)
Pt returned from xray

## 2014-09-25 NOTE — Progress Notes (Signed)
Orthopedic Tech Progress Note Patient Details:  Susan Benjamin [birth year] [id number]  Ortho Devices Type of Ortho Device: Ace wrap, Volar splint Ortho Device/Splint Location: lue Ortho Device/Splint Interventions: Application   Jeron Grahn 09/25/2014, 9:49 AM

## 2014-09-25 NOTE — ED Provider Notes (Signed)
CSN: [id number]     Arrival date & time 09/25/14  2956 History   First MD Initiated Contact with Patient 09/25/14 0800     Chief Complaint  Patient presents with  . Wrist Injury     (Consider location/radiation/quality/duration/timing/severity/associated sxs/prior Treatment) HPI The patient slipped on ice this morning and fell onto her buttocks. She reports she had put her hands behind her and injured her left wrist. She denies any pain in her back or buttocks. She did not hit her head. There are no other complaints. She reports her wrist is painful with any type of movement and it has become swollen. She denies any other significant past medical history. Past Medical History  Diagnosis Date  . CIN I (cervical intraepithelial neoplasia I)   . Endometrial polyp   . Ectopic pregnancy     X 2  . Seasonal allergies   . Anxiety    Past Surgical History  Procedure Laterality Date  . Cervical biopsy  w/ loop electrode excision  2003  . Tubal ligation      tubal lig and tubal reversal  . Hysteroscopy  2011    endometrial polyp  . Dilation and curettage of uterus  2011  . Carpal tunnel release    . Ectopic pregnancy surgery      X 2-Right and Left salpingectomy  . Foot surgery    . Vaginal hysterectomy  2012   Family History  Problem Relation Age of Onset  . Diabetes Mother   . Hypertension Mother   . Stroke Mother   . Heart disease Mother   . Hypertension Father    History  Substance Use Topics  . Smoking status: Never Smoker   . Smokeless tobacco: Never Used  . Alcohol Use: Yes     Comment: occcasionally   OB History    Gravida Para Term Preterm AB TAB SAB Ectopic Multiple Living   5 2 2  3   2  2      Review of Systems Constitutional: No recent fever chills or general illness Musculoskeletal: No back or lower extremity pain.  Allergies  Review of patient's allergies indicates no known allergies.  Home Medications   Prior to Admission medications   Medication  Sig Start Date End Date Taking? Authorizing Provider  ALPRAZolam (XANAX) 0.25 MG tablet Take 1 tablet (0.25 mg total) by mouth at bedtime as needed for sleep. 10/13/13  Yes Huel Cote, NP  estradiol (ESTRACE) 1 MG tablet Take 1 tablet (1 mg total) by mouth daily. 10/13/13  Yes Huel Cote, NP  ibuprofen (ADVIL,MOTRIN) 200 MG tablet Take 400-800 mg by mouth every 6 (six) hours as needed for mild pain or moderate pain.   Yes Historical Provider, MD  loratadine (CLARITIN) 10 MG tablet Take 10 mg by mouth daily as needed for allergies.    Yes Historical Provider, MD  ibuprofen (ADVIL,MOTRIN) 800 MG tablet Take 1 tablet (800 mg total) by mouth 3 (three) times daily. 09/25/14   Charlesetta Shanks, MD   BP 114/65 mmHg  Pulse 67  Temp(Src) 97.9 F (36.6 C) (Oral)  Resp 18  Ht 5\' 4"  (1.626 m)  Wt 157 lb (71.215 kg)  BMI 26.94 kg/m2  SpO2 98% Physical Exam  Constitutional: She is oriented to person, place, and time. She appears well-developed and well-nourished. No distress.  HENT:  Head: Normocephalic and atraumatic.  Eyes: EOM are normal.  Neck: Neck supple.  No C-spine tenderness to palpation.  Cardiovascular: Intact distal  pulses.   Pulmonary/Chest: Effort normal.  Musculoskeletal:  Spine is nontender to palpation. This includes the coccyx. Mild deformity to the left wrist with ecchymosis present. Radial pulses 2+ and intact. Hand is warm and dry. Patient is able to move digits but is painful for her.  Neurological: She is alert and oriented to person, place, and time. Coordination normal.  Skin: Skin is warm and dry.  Psychiatric: She has a normal mood and affect.    ED Course  Procedures (including critical care time) Labs Review Labs Reviewed - No data to display  Imaging Review Dg Wrist Complete Left  09/25/2014   CLINICAL DATA:  Slipped on ice yesterday. Patient fell backward onto back stretched hand. Wrist painful and swelling, no laceration. Prior carpal tunnel surgery over 10  years ago.  EXAM: LEFT WRIST - COMPLETE 3+ VIEW  COMPARISON:  None.  FINDINGS: Amorphous calcifications posterior to the triquetrum on the lateral projection are indistinctly marginated but suspicious for a triquetrum fracture. Small geode or subcortical cyst laterally in the lunate. No scapholunate widening. Distal radius and distal ulna appear intact.  IMPRESSION: 1. Amorphous calcific density dorsal to the triquetrum suspicious for triquetrum fracture.   Electronically Signed   By: Sherryl Barters M.D.   On: 09/25/2014 09:01     EKG Interpretation None      MDM   Final diagnoses:  Triquetral chip fracture, left, closed, initial encounter   The patient presents as aligned above with what appears to be an avulsion type fracture to the triquetrum. The patient is neurovascularly intact. The patient is splinted and referred for outpatient orthopedic follow-up. She does not have any other identified injury and no other complaints.    Charlesetta Shanks, MD 09/25/14 [phone]

## 2014-09-25 NOTE — Discharge Instructions (Signed)
Wrist Fracture A wrist fracture is a break or crack in one of the bones of your wrist. Your wrist is made up of eight small bones at the palm of your hand (carpal bones) and two long bones that make up your forearm (radius and ulna).  CAUSES   A direct blow to the wrist.  Falling on an outstretched hand.  Trauma, such as a car accident or a fall. RISK FACTORS Risk factors for wrist fracture include:   Participating in contact and high-risk sports, such as skiing, biking, and ice skating.  Taking steroid medicines.  Smoking.  Being female.  Being Caucasian.  Drinking more than three alcoholic beverages per day.  Having low or lowered bone density (osteoporosis or osteopenia).  Age. Older adults have decreased bone density.  Women who have had menopause.  History of previous fractures. SIGNS AND SYMPTOMS Symptoms of wrist fractures include tenderness, bruising, and inflammation. Additionally, the wrist may hang in an odd position or appear deformed.  DIAGNOSIS Diagnosis may include:  Physical exam.  X-ray. TREATMENT Treatment depends on many factors, including the nature and location of the fracture, your age, and your activity level. Treatment for wrist fracture can be nonsurgical or surgical.  Nonsurgical Treatment A plaster cast or splint may be applied to your wrist if the bone is in a good position. If the fracture is not in good position, it may be necessary for your health care provider to realign it before applying a splint or cast. Usually, a cast or splint will be worn for several weeks.  Surgical Treatment Sometimes the position of the bone is so far out of place that surgery is required to apply a device to hold it together as it heals. Depending on the fracture, there are a number of options for holding the bone in place while it heals, such as a cast and metal pins.  HOME CARE INSTRUCTIONS  Keep your injured wrist elevated and move your fingers as much as  possible.  Do not put pressure on any part of your cast or splint. It may break.   Use a plastic bag to protect your cast or splint from water while bathing or showering. Do not lower your cast or splint into water.  Take medicines only as directed by your health care provider.  Keep your cast or splint clean and dry. If it becomes wet, damaged, or suddenly feels too tight, contact your health care provider right away.  Do not use any tobacco products including cigarettes, chewing tobacco, or electronic cigarettes. Tobacco can delay bone healing. If you need help quitting, ask your health care provider.  Keep all follow-up visits as directed by your health care provider. This is important.  Ask your health care provider if you should take supplements of calcium and vitamins C and D to promote bone healing. SEEK MEDICAL CARE IF:   Your cast or splint is damaged, breaks, or gets wet.  You have a fever.  You have chills.  You have continued severe pain or more swelling than you did before the cast was put on. SEEK IMMEDIATE MEDICAL CARE IF:   Your hand or fingernails on the injured arm turn blue or gray, or feel cold or numb.  You have decreased feeling in the fingers of your injured arm. MAKE SURE YOU:  Understand these instructions.  Will watch your condition.  Will get help right away if you are not doing well or get worse. Document Released: 05/30/2005 Document Revised:   01/04/2014 Document Reviewed: 09/07/2011 [employer] Patient Information 2015 [employer]. This information is not intended to replace advice given to you by your health care provider. Make sure you discuss any questions you have with your health care provider.  

## 2014-09-25 NOTE — ED Notes (Signed)
Pt slipped yesterday walking dog and c/o pain and swelling to left wrist. Good pulses. Minimal movement with fingers.

## 2014-09-25 NOTE — ED Notes (Signed)
Dr. Johnney Killian back at the bedside.

## 2014-11-17 ENCOUNTER — Emergency Department (INDEPENDENT_AMBULATORY_CARE_PROVIDER_SITE_OTHER): Payer: BC Managed Care – PPO

## 2014-11-17 ENCOUNTER — Emergency Department (INDEPENDENT_AMBULATORY_CARE_PROVIDER_SITE_OTHER)
Admission: EM | Admit: 2014-11-17 | Discharge: 2014-11-17 | Disposition: A | Discharge status: Home / Self Care | Payer: BC Managed Care – PPO | Source: Home / Self Care | Attending: Family Medicine | Admitting: Family Medicine

## 2014-11-17 ENCOUNTER — Emergency Department (INDEPENDENT_AMBULATORY_CARE_PROVIDER_SITE_OTHER)
Admission: EM | Admit: 2014-11-17 | Discharge: 2014-11-17 | Disposition: A | Discharge status: Home / Self Care | Payer: Worker's Compensation | Source: Home / Self Care | Attending: Family Medicine | Admitting: Family Medicine

## 2014-11-17 ENCOUNTER — Encounter (HOSPITAL_COMMUNITY): Payer: Self-pay | Admitting: *Deleted

## 2014-11-17 DIAGNOSIS — J040 Acute laryngitis: Secondary | ICD-10-CM

## 2014-11-17 DIAGNOSIS — R030 Elevated blood-pressure reading, without diagnosis of hypertension: Secondary | ICD-10-CM

## 2014-11-17 DIAGNOSIS — S60212A Contusion of left wrist, initial encounter: Secondary | ICD-10-CM | POA: Diagnosis not present

## 2014-11-17 DIAGNOSIS — IMO0001 Reserved for inherently not codable concepts without codable children: Secondary | ICD-10-CM

## 2014-11-17 MED ORDER — FLUTICASONE PROPIONATE 50 MCG/ACT NA SUSP: 2.0000 | Freq: Every day | NASAL | Status: DC

## 2014-11-17 MED ORDER — KETOROLAC TROMETHAMINE 30 MG/ML IJ SOLN: 30.0000 mg | Freq: Once | INTRAMUSCULAR | Status: DC

## 2014-11-17 MED ORDER — PREDNISONE 10 MG PO TABS: 30.0000 mg | ORAL_TABLET | Freq: Every day | ORAL | Status: DC

## 2014-11-17 MED ORDER — TRIAMCINOLONE ACETONIDE 40 MG/ML IJ SUSP: 40.0000 mg | Freq: Once | INTRAMUSCULAR | Status: DC

## 2014-11-17 NOTE — Discharge Instructions (Signed)
Thank you for coming in today. Call or go to the emergency room if you get worse, have trouble breathing, have chest pains, or palpitations.  Take prednisone if not better.   Laryngitis At the top of your windpipe is your voice box. It is the source of your voice. Inside your voice box are 2 bands of muscles called vocal cords. When you breathe, your vocal cords are relaxed and open so that air can get into the lungs. When you decide to say something, these cords come together and vibrate. The sound from these vibrations goes into your throat and comes out through your mouth as sound. Laryngitis is an inflammation of the vocal cords that causes hoarseness, cough, loss of voice, sore throat, and dry throat. Laryngitis can be temporary (acute) or long-term (chronic). Most cases of acute laryngitis improve with time.Chronic laryngitis lasts for more than 3 weeks. CAUSES Laryngitis can often be related to excessive smoking, talking, or yelling, as well as inhalation of toxic fumes and allergies. Acute laryngitis is usually caused by a viral infection, vocal strain, measles or mumps, or bacterial infections. Chronic laryngitis is usually caused by vocal cord strain, vocal cord injury, postnasal drip, growths on the vocal cords, or acid reflux. SYMPTOMS   Cough.  Sore throat.  Dry throat. RISK FACTORS  Respiratory infections.  Exposure to irritating substances, such as cigarette smoke, excessive amounts of alcohol, stomach acids, and workplace chemicals.  Voice trauma, such as vocal cord injury from shouting or speaking too loud. DIAGNOSIS  Your cargiver will perform a physical exam. During the physical exam, your caregiver will examine your throat. The most common sign of laryngitis is hoarseness. Laryngoscopy may be necessary to confirm the diagnosis of this condition. This procedure allows your caregiver to look into the larynx. HOME CARE INSTRUCTIONS  Drink enough fluids to keep your urine  clear or pale yellow.  Rest until you no longer have symptoms or as directed by your caregiver.  Breathe in moist air.  Take all medicine as directed by your caregiver.  Do not smoke.  Talk as little as possible (this includes whispering).  Write on paper instead of talking until your voice is back to normal.  Follow up with your caregiver if your condition has not improved after 10 days. SEEK MEDICAL CARE IF:   You have trouble breathing.  You cough up blood.  You have persistent fever.  You have increasing pain.  You have difficulty swallowing. MAKE SURE YOU:  Understand these instructions.  Will watch your condition.  Will get help right away if you are not doing well or get worse. Document Released: 08/20/2005 Document Revised: 11/12/2011 Document Reviewed: 10/26/2010 [facility] Patient Information 2015 [place], [place]. This information is not intended to replace advice given to you by your health care provider. Make sure you discuss any questions you have with your health care provider.    Hypertension Hypertension, commonly called high blood pressure, is when the force of blood pumping through your arteries is too strong. Your arteries are the blood vessels that carry blood from your heart throughout your body. A blood pressure reading consists of a higher number over a lower number, such as 110/72. The higher number (systolic) is the pressure inside your arteries when your heart pumps. The lower number (diastolic) is the pressure inside your arteries when your heart relaxes. Ideally you want your blood pressure below 120/80. Hypertension forces your heart to work harder to pump blood. Your arteries may become narrow  or stiff. Having hypertension puts you at risk for heart disease, stroke, and other problems.  RISK FACTORS Some risk factors for high blood pressure are controllable. Others are not.  Risk factors you cannot control include:   Race. You may be at higher  risk if you are African American.  Age. Risk increases with age.  Gender. Men are at higher risk than women before age 87 years. After age 70, women are at higher risk than men. Risk factors you can control include:  Not getting enough exercise or physical activity.  Being overweight.  Getting too much fat, sugar, calories, or salt in your diet.  Drinking too much alcohol. SIGNS AND SYMPTOMS Hypertension does not usually cause signs or symptoms. Extremely high blood pressure (hypertensive crisis) may cause headache, anxiety, shortness of breath, and nosebleed. DIAGNOSIS  To check if you have hypertension, your health care provider will measure your blood pressure while you are seated, with your arm held at the level of your heart. It should be measured at least twice using the same arm. Certain conditions can cause a difference in blood pressure between your right and left arms. A blood pressure reading that is higher than normal on one occasion does not mean that you need treatment. If one blood pressure reading is high, ask your health care provider about having it checked again. TREATMENT  Treating high blood pressure includes making lifestyle changes and possibly taking medicine. Living a healthy lifestyle can help lower high blood pressure. You may need to change some of your habits. Lifestyle changes may include:  Following the DASH diet. This diet is high in fruits, vegetables, and whole grains. It is low in salt, red meat, and added sugars.  Getting at least 2 hours of brisk physical activity every week.  Losing weight if necessary.  Not smoking.  Limiting alcoholic beverages.  Learning ways to reduce stress. If lifestyle changes are not enough to get your blood pressure under control, your health care provider may prescribe medicine. You may need to take more than one. Work closely with your health care provider to understand the risks and benefits. HOME CARE  INSTRUCTIONS  Have your blood pressure rechecked as directed by your health care provider.   Take medicines only as directed by your health care provider. Follow the directions carefully. Blood pressure medicines must be taken as prescribed. The medicine does not work as well when you skip doses. Skipping doses also puts you at risk for problems.   Do not smoke.   Monitor your blood pressure at home as directed by your health care provider. SEEK MEDICAL CARE IF:   You think you are having a reaction to medicines taken.  You have recurrent headaches or feel dizzy.  You have swelling in your ankles.  You have trouble with your vision. SEEK IMMEDIATE MEDICAL CARE IF:  You develop a severe headache or confusion.  You have unusual weakness, numbness, or feel faint.  You have severe chest or abdominal pain.  You vomit repeatedly.  You have trouble breathing. MAKE SURE YOU:   Understand these instructions.  Will watch your condition.  Will get help right away if you are not doing well or get worse. Document Released: 08/20/2005 Document Revised: 01/04/2014 Document Reviewed: 06/12/2013 [facility] Patient Information 2015 [place], [place]. This information is not intended to replace advice given to you by your health care provider. Make sure you discuss any questions you have with your health care provider.

## 2014-11-17 NOTE — ED Provider Notes (Signed)
Susan Benjamin is a 59 y.o. female who presents to Urgent Care today for left wrist injury. Patient tripped and fell at work today landing on her left wrist. She just got her cast off her left wrist today from a previous injury where she suffered a avulsion fracture of the left triquetrum.  She was managed by Dr. Burney Gauze.. She notes pain and swelling of the left dorsal wrist. No radiating pain weakness or numbness.  Patient also landed on her left anterior knee. This is mildly tender. No difficulty with range of motion or walking.   Past Medical History  Diagnosis Date  . CIN I (cervical intraepithelial neoplasia I)   . Endometrial polyp   . Ectopic pregnancy     X 2  . Seasonal allergies   . Anxiety    Past Surgical History  Procedure Laterality Date  . Cervical biopsy  w/ loop electrode excision  2003  . Tubal ligation      tubal lig and tubal reversal  . Hysteroscopy  2011    endometrial polyp  . Dilation and curettage of uterus  2011  . Carpal tunnel release    . Ectopic pregnancy surgery      X 2-Right and Left salpingectomy  . Foot surgery    . Vaginal hysterectomy  2012   History  Substance Use Topics  . Smoking status: Never Smoker   . Smokeless tobacco: Never Used  . Alcohol Use: Yes     Comment: rarely   ROS as above Medications: Current Facility-Administered Medications  Medication Dose Route Frequency Provider Last Rate Last Dose  . ketorolac (TORADOL) 30 MG/ML injection 30 mg  30 mg Intramuscular Once Billy Fischer, MD      . triamcinolone acetonide (KENALOG-40) injection 40 mg  40 mg Intramuscular Once Billy Fischer, MD       Current Outpatient Prescriptions  Medication Sig Dispense Refill  . ALPRAZolam (XANAX) 0.25 MG tablet Take 1 tablet (0.25 mg total) by mouth at bedtime as needed for sleep. 30 tablet 1  . estradiol (ESTRACE) 1 MG tablet Take 1 tablet (1 mg total) by mouth daily. 90 tablet 4  . ibuprofen (ADVIL,MOTRIN) 200 MG tablet Take 400-800 mg by  mouth every 6 (six) hours as needed for mild pain or moderate pain.    Marland Kitchen ibuprofen (ADVIL,MOTRIN) 800 MG tablet Take 1 tablet (800 mg total) by mouth 3 (three) times daily. 21 tablet 0  . loratadine (CLARITIN) 10 MG tablet Take 10 mg by mouth daily as needed for allergies.      No Known Allergies   Exam:  BP 184/106 mmHg  Pulse 81  Temp(Src) 97.4 F (36.3 C) (Oral)  Resp 16  SpO2 99% Gen: Well NAD Left wrist swollen and tender dorsally. Nontender in the anatomical snuff box. Grip strength is diminished. Pulses capillary refill sensation are intact. Elbow and shoulder are nontender. Left knee mild anterior contusion. Minimally tender. Full range of motion normal strength. Stable ligamentous exam.  No results found for this or any previous visit (from the past 24 hour(s)). Dg Wrist Complete Left  11/17/2014   CLINICAL DATA:  Recent triquetrum fracture ; fell earlier today  EXAM: LEFT WRIST - COMPLETE 3+ VIEW  COMPARISON:  September 25, 2014  FINDINGS: Frontal, oblique lateral, and ulnar deviation scaphoid images were obtained. The previous calcification posterior to the triquetrum has resolved. Currently there is no demonstrable fracture or dislocation. Joint spaces appear intact. No erosive change. Bones appear  mildly osteoporotic, probably due to recent prolonged immobilization from casting.  IMPRESSION: No acute fracture or dislocation.  No appreciable arthropathy.   Electronically Signed   By: Lowella Grip III M.D.   On: 11/17/2014 19:40    Assessment and Plan: 59 y.o. female with left wrist injury. Likely contusion versus strain. This is complicated by a recent previous wrist injury. Plan to use Tylenol ice rest bracing (using existing brace) and follow back up with hand surgeon.  Note blood pressure is elevated. Please see other note.  Discussed warning signs or symptoms. Please see discharge instructions. Patient expresses understanding.     Gregor Hams, MD 11/17/14 2009

## 2014-11-17 NOTE — ED Notes (Addendum)
States she tripped on a rug in the front office at [employer] @ [phone].  States this is WC.  States she landed on her hands and knees.  Abrasion to L knee.  C/o pain L wrist. It appears swollen  She broke it in January and got cast off yesterday.  Was seeing Dr. Burney Gauze.  Also c/o soreness L shoulder, L lower back.

## 2014-11-17 NOTE — ED Provider Notes (Signed)
Susan Benjamin is a 59 y.o. female who presents to Urgent Care today for cough congestion and runny nose. Symptoms present for one or 2 days. Patient also notes a hoarse voice. No chest pains palpitations or shortness of breath. She's tried some Claritin which helped a little. Patient was also seen today for unrelated Worker's Compensation injury. This is a separate note. She denies any history of hypertension.   Past Medical History  Diagnosis Date  . CIN I (cervical intraepithelial neoplasia I)   . Endometrial polyp   . Ectopic pregnancy     X 2  . Seasonal allergies   . Anxiety    Past Surgical History  Procedure Laterality Date  . Cervical biopsy  w/ loop electrode excision  2003  . Tubal ligation      tubal lig and tubal reversal  . Hysteroscopy  2011    endometrial polyp  . Dilation and curettage of uterus  2011  . Carpal tunnel release    . Ectopic pregnancy surgery      X 2-Right and Left salpingectomy  . Foot surgery    . Vaginal hysterectomy  2012   History  Substance Use Topics  . Smoking status: Never Smoker   . Smokeless tobacco: Never Used  . Alcohol Use: Yes     Comment: rarely   ROS as above Medications: No current facility-administered medications for this encounter.   Current Outpatient Prescriptions  Medication Sig Dispense Refill  . ALPRAZolam (XANAX) 0.25 MG tablet Take 1 tablet (0.25 mg total) by mouth at bedtime as needed for sleep. 30 tablet 1  . estradiol (ESTRACE) 1 MG tablet Take 1 tablet (1 mg total) by mouth daily. 90 tablet 4  . fluticasone (FLONASE) 50 MCG/ACT nasal spray Place 2 sprays into both nostrils daily. 16 g 2  . ibuprofen (ADVIL,MOTRIN) 200 MG tablet Take 400-800 mg by mouth every 6 (six) hours as needed for mild pain or moderate pain.    Marland Kitchen ibuprofen (ADVIL,MOTRIN) 800 MG tablet Take 1 tablet (800 mg total) by mouth 3 (three) times daily. 21 tablet 0  . loratadine (CLARITIN) 10 MG tablet Take 10 mg by mouth daily as needed for  allergies.     . predniSONE (DELTASONE) 10 MG tablet Take 3 tablets (30 mg total) by mouth daily. 15 tablet 0   No Known Allergies   Exam:  BP 184/106 mmHg  Pulse 81  Temp(Src) 97.4 F (36.3 C) (Oral)  Resp 16  SpO2 99% Gen: Well NAD HEENT: EOMI,  MMM clear nasal discharge with cobblestoning of the posterior pharynx. Lungs: Normal work of breathing. CTABL hoarse voice Heart: RRR no MRG Abd: NABS, Soft. Nondistended, Nontender Exts: Brisk capillary refill, warm and well perfused.   No results found for this or any previous visit (from the past 24 hour(s)). Dg Wrist Complete Left  11/17/2014   CLINICAL DATA:  Recent triquetrum fracture ; fell earlier today  EXAM: LEFT WRIST - COMPLETE 3+ VIEW  COMPARISON:  September 25, 2014  FINDINGS: Frontal, oblique lateral, and ulnar deviation scaphoid images were obtained. The previous calcification posterior to the triquetrum has resolved. Currently there is no demonstrable fracture or dislocation. Joint spaces appear intact. No erosive change. Bones appear mildly osteoporotic, probably due to recent prolonged immobilization from casting.  IMPRESSION: No acute fracture or dislocation.  No appreciable arthropathy.   Electronically Signed   By: Lowella Grip III M.D.   On: 11/17/2014 19:40    Assessment and Plan:  59 y.o. female with laryngitis and rhinitis due to seasonal allergies. Treat with Flonase nasal spray, oral antihistamines, and prednisone as needed.  She additionally has elevated blood pressure. Recommend follow-up with PCP in the near future for recheck.  Discussed warning signs or symptoms. Please see discharge instructions. Patient expresses understanding.     Gregor Hams, MD 11/17/14 2029

## 2014-11-17 NOTE — Discharge Instructions (Signed)
Thank you for coming in today. Follow up with Dr. Burney Gauze.  Return as needed.  Use the brace as needed.  Contusion A contusion is a deep bruise. Contusions are the result of an injury that caused bleeding under the skin. The contusion may turn blue, purple, or yellow. Minor injuries will give you a painless contusion, but more severe contusions may stay painful and swollen for a few weeks.  CAUSES  A contusion is usually caused by a blow, trauma, or direct force to an area of the body. SYMPTOMS   Swelling and redness of the injured area.  Bruising of the injured area.  Tenderness and soreness of the injured area.  Pain. DIAGNOSIS  The diagnosis can be made by taking a history and physical exam. An X-ray, CT scan, or MRI may be needed to determine if there were any associated injuries, such as fractures. TREATMENT  Specific treatment will depend on what area of the body was injured. In general, the best treatment for a contusion is resting, icing, elevating, and applying cold compresses to the injured area. Over-the-counter medicines may also be recommended for pain control. Ask your caregiver what the best treatment is for your contusion. HOME CARE INSTRUCTIONS   Put ice on the injured area.  Put ice in a plastic bag.  Place a towel between your skin and the bag.  Leave the ice on for 15-20 minutes, 3-4 times a day, or as directed by your health care provider.  Only take over-the-counter or prescription medicines for pain, discomfort, or fever as directed by your caregiver. Your caregiver may recommend avoiding anti-inflammatory medicines (aspirin, ibuprofen, and naproxen) for 48 hours because these medicines may increase bruising.  Rest the injured area.  If possible, elevate the injured area to reduce swelling. SEEK IMMEDIATE MEDICAL CARE IF:   You have increased bruising or swelling.  You have pain that is getting worse.  Your swelling or pain is not relieved with  medicines. MAKE SURE YOU:   Understand these instructions.  Will watch your condition.  Will get help right away if you are not doing well or get worse. Document Released: 05/30/2005 Document Revised: 08/25/2013 Document Reviewed: 06/25/2011 [facility] Patient Information 2015 [place], [place]. This information is not intended to replace advice given to you by your health care provider. Make sure you discuss any questions you have with your health care provider.

## 2014-11-17 NOTE — ED Notes (Signed)
C/o headache, sore throat, laryngitis, body aches, and cough onset yesterday.

## 2014-12-22 ENCOUNTER — Other Ambulatory Visit: Payer: Self-pay | Admitting: Women's Health

## 2014-12-22 DIAGNOSIS — Z1231 Encounter for screening mammogram for malignant neoplasm of breast: Secondary | ICD-10-CM

## 2014-12-30 ENCOUNTER — Ambulatory Visit (INDEPENDENT_AMBULATORY_CARE_PROVIDER_SITE_OTHER): Payer: BC Managed Care – PPO | Admitting: Women's Health

## 2014-12-30 ENCOUNTER — Encounter: Payer: Self-pay | Admitting: Women's Health

## 2014-12-30 VITALS — BP 140/80 | Ht 63.0 in | Wt 169.0 lb

## 2014-12-30 DIAGNOSIS — Z01419 Encounter for gynecological examination (general) (routine) without abnormal findings: Secondary | ICD-10-CM

## 2014-12-30 DIAGNOSIS — Z79890 Hormone replacement therapy (postmenopausal): Secondary | ICD-10-CM

## 2014-12-30 MED ORDER — ESTRADIOL 0.5 MG PO TABS: 0.5000 mg | ORAL_TABLET | Freq: Every day | ORAL | Status: DC

## 2014-12-30 NOTE — Patient Instructions (Signed)
Health Recommendations for Postmenopausal Women Respected and ongoing research has looked at the most common causes of death, disability, and poor quality of life in postmenopausal women. The causes include heart disease, diseases of blood vessels, diabetes, depression, cancer, and bone loss (osteoporosis). Many things can be done to help lower the chances of developing these and other common problems. CARDIOVASCULAR DISEASE Heart Disease: A heart attack is a medical emergency. Know the signs and symptoms of a heart attack. Below are things women can do to reduce their risk for heart disease.   Do not smoke. If you smoke, quit.  Aim for a healthy weight. Being overweight causes many preventable deaths. Eat a healthy and balanced diet and drink an adequate amount of liquids.  Get moving. Make a commitment to be more physically active. Aim for 30 minutes of activity on most, if not all days of the week.  Eat for heart health. Choose a diet that is low in saturated fat and cholesterol and eliminate trans fat. Include whole grains, vegetables, and fruits. Read and understand the labels on food containers before buying.  Know your numbers. Ask your caregiver to check your blood pressure, cholesterol (total, HDL, LDL, triglycerides) and blood glucose. Work with your caregiver on improving your entire clinical picture.  High blood pressure. Limit or stop your table salt intake (try salt substitute and food seasonings). Avoid salty foods and drinks. Read labels on food containers before buying. Eating well and exercising can help control high blood pressure. STROKE  Stroke is a medical emergency. Stroke may be the result of a blood clot in a blood vessel in the brain or by a brain hemorrhage (bleeding). Know the signs and symptoms of a stroke. To lower the risk of developing a stroke:  Avoid fatty foods.  Quit smoking.  Control your diabetes, blood pressure, and irregular heart rate. THROMBOPHLEBITIS  (BLOOD CLOT) OF THE LEG  Becoming overweight and leading a stationary lifestyle may also contribute to developing blood clots. Controlling your diet and exercising will help lower the risk of developing blood clots. CANCER SCREENING  Breast Cancer: Take steps to reduce your risk of breast cancer.  You should practice "breast self-awareness." This means understanding the normal appearance and feel of your breasts and should include breast self-examination. Any changes detected, no matter how small, should be reported to your caregiver.  After age 40, you should have a clinical breast exam (CBE) every year.  Starting at age 40, you should consider having a mammogram (breast X-ray) every year.  If you have a family history of breast cancer, talk to your caregiver about genetic screening.  If you are at high risk for breast cancer, talk to your caregiver about having an MRI and a mammogram every year.  Intestinal or Stomach Cancer: Tests to consider are a rectal exam, fecal occult blood, sigmoidoscopy, and colonoscopy. Women who are high risk may need to be screened at an earlier age and more often.  Cervical Cancer:  Beginning at age 30, you should have a Pap test every 3 years as long as the past 3 Pap tests have been normal.  If you have had past treatment for cervical cancer or a condition that could lead to cancer, you need Pap tests and screening for cancer for at least 20 years after your treatment.  If you had a hysterectomy for a problem that was not cancer or a condition that could lead to cancer, then you no longer need Pap tests.    If you are between ages 65 and 70, and you have had normal Pap tests going back 10 years, you no longer need Pap tests.  If Pap tests have been discontinued, risk factors (such as a new sexual partner) need to be reassessed to determine if screening should be resumed.  Some medical problems can increase the chance of getting cervical cancer. In these  cases, your caregiver may recommend more frequent screening and Pap tests.  Uterine Cancer: If you have vaginal bleeding after reaching menopause, you should notify your caregiver.  Ovarian Cancer: Other than yearly pelvic exams, there are no reliable tests available to screen for ovarian cancer at this time except for yearly pelvic exams.  Lung Cancer: Yearly chest X-rays can detect lung cancer and should be done on high risk women, such as cigarette smokers and women with chronic lung disease (emphysema).  Skin Cancer: A complete body skin exam should be done at your yearly examination. Avoid overexposure to the sun and ultraviolet light lamps. Use a strong sun block cream when in the sun. All of these things are important for lowering the risk of skin cancer. MENOPAUSE Menopause Symptoms: Hormone therapy products are effective for treating symptoms associated with menopause:  Moderate to severe hot flashes.  Night sweats.  Mood swings.  Headaches.  Tiredness.  Loss of sex drive.  Insomnia.  Other symptoms. Hormone replacement carries certain risks, especially in older women. Women who use or are thinking about using estrogen or estrogen with progestin treatments should discuss that with their caregiver. Your caregiver will help you understand the benefits and risks. The ideal dose of hormone replacement therapy is not known. The Food and Drug Administration (FDA) has concluded that hormone therapy should be used only at the lowest doses and for the shortest amount of time to reach treatment goals.  OSTEOPOROSIS Protecting Against Bone Loss and Preventing Fracture If you use hormone therapy for prevention of bone loss (osteoporosis), the risks for bone loss must outweigh the risk of the therapy. Ask your caregiver about other medications known to be safe and effective for preventing bone loss and fractures. To guard against bone loss or fractures, the following is recommended:  If  you are younger than age 50, take 1000 mg of calcium and at least 600 mg of Vitamin D per day.  If you are older than age 50 but younger than age 70, take 1200 mg of calcium and at least 600 mg of Vitamin D per day.  If you are older than age 70, take 1200 mg of calcium and at least 800 mg of Vitamin D per day. Smoking and excessive alcohol intake increases the risk of osteoporosis. Eat foods rich in calcium and vitamin D and do weight bearing exercises several times a week as your caregiver suggests. DIABETES Diabetes Mellitus: If you have type I or type 2 diabetes, you should keep your blood sugar under control with diet, exercise, and recommended medication. Avoid starchy and fatty foods, and too many sweets. Being overweight can make diabetes control more difficult. COGNITION AND MEMORY Cognition and Memory: Menopausal hormone therapy is not recommended for the prevention of cognitive disorders such as Alzheimer's disease or memory loss.  DEPRESSION  Depression may occur at any age, but it is common in elderly women. This may be because of physical, medical, social (loneliness), or financial problems and needs. If you are experiencing depression because of medical problems and control of symptoms, talk to your caregiver about this. Physical   activity and exercise may help with mood and sleep. Community and volunteer involvement may improve your sense of value and worth. If you have depression and you feel that the problem is getting worse or becoming severe, talk to your caregiver about which treatment options are best for you. ACCIDENTS  Accidents are common and can be serious in elderly woman. Prepare your house to prevent accidents. Eliminate throw rugs, place hand bars in bath, shower, and toilet areas. Avoid wearing high heeled shoes or walking on wet, snowy, and icy areas. Limit or stop driving if you have vision or hearing problems, or if you feel you are unsteady with your movements and  reflexes. HEPATITIS C Hepatitis C is a type of viral infection affecting the liver. It is spread mainly through contact with blood from an infected person. It can be treated, but if left untreated, it can lead to severe liver damage over the years. Many people who are infected do not know that the virus is in their blood. If you are a "baby-boomer", it is recommended that you have one screening test for Hepatitis C. IMMUNIZATIONS  Several immunizations are important to consider having during your senior years, including:   Tetanus, diphtheria, and pertussis booster shot.  Influenza every year before the flu season begins.  Pneumonia vaccine.  Shingles vaccine.  Others, as indicated based on your specific needs. Talk to your caregiver about these. Document Released: 10/12/2005 Document Revised: 01/04/2014 Document Reviewed: 06/07/2008 Exit[employer] Patient Information 2015 [employer]. This information is not intended to replace advice given to you by your health care provider. Make sure you discuss any questions you have with your health care provider.  

## 2014-12-30 NOTE — Progress Notes (Signed)
Susan Benjamin [birth year] [id number]    History:    Presents for annual exam.  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 Normal mammogram history 2015 DEXA T score 1.7 at spine, 0.0 at hip. 2011 Benign colon polyp /10 year follow-up.   Past medical history, past surgical history, family history and social history were all reviewed and documented in the EPIC chart. 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 Mother diabetes/hypertension/heart disease. Father hypertension, liver cancer both died 4 years ago within weeks of each other.  ROS:  A ROS was performed and pertinent positives and negatives are included.  Exam:  Filed Vitals:   12/30/14 1506  BP: 140/80    General appearance:  Normal Thyroid:  Symmetrical, normal in size, without palpable masses or nodularity. Respiratory  Auscultation:  Clear without wheezing or rhonchi Cardiovascular  Auscultation:  Regular rate, without rubs, murmurs or gallops  Edema/varicosities:  Not grossly evident Abdominal  Soft,nontender, without masses, guarding or rebound.  Liver/spleen:  No organomegaly noted  Hernia:  None appreciated  Skin  Inspection:  Grossly normal   Breasts: Examined lying and sitting.     Right: Without masses, retractions, discharge or axillary adenopathy.     Left: Without masses, retractions, discharge or axillary adenopathy. Gentitourinary   Inguinal/mons:  Normal without inguinal adenopathy  External genitalia:  Normal  BUS/Urethra/Skene's glands:  Normal  Vagina:  Normal  Cervix:  Absent  Uterus:  Absent  Adnexa/parametria:     Rt: Without masses or tenderness.   Lt: Without masses or tenderness.  Anus and perineum: Normal  Digital rectal exam: Normal sphincter tone without palpated masses or tenderness  Assessment/Plan:  59 y.o. MWF G5P2 for annual exam with no complaints.  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 Regular exercise, calcium rich diet,  vitamin D 1000 daily encouraged. HRT reviewed, has been on estradiol 1 mg will try  0.5 daily, prescription, proper use given and reviewed risk for blood clots, strokes, breast cancer. Will call if problems with change. Home safety, fall prevention discussed. Fractured left wrist  fall on ice this year. Reviewed blood pressure slightly elevated, continue to monitor if continues greater than 130/80 follow-up with primary care.    [place], 5:03 PM 12/30/2014

## 2014-12-31 LAB — URINALYSIS W MICROSCOPIC + REFLEX CULTURE
Bacteria, UA: NONE SEEN
Bilirubin Urine: NEGATIVE
Casts: NONE SEEN
Crystals: NONE SEEN
GLUCOSE, UA: NEGATIVE mg/dL
Hgb urine dipstick: NEGATIVE
Ketones, ur: NEGATIVE mg/dL
Leukocytes, UA: NEGATIVE
Nitrite: NEGATIVE
PROTEIN: NEGATIVE mg/dL
SPECIFIC GRAVITY, URINE: 1.023 (ref 1.005–1.030)
UROBILINOGEN UA: 0.2 mg/dL (ref 0.0–1.0)
pH: 6 (ref 5.0–8.0)

## 2015-01-05 ENCOUNTER — Ambulatory Visit (HOSPITAL_COMMUNITY)
Admission: RE | Admit: 2015-01-05 | Discharge: 2015-01-05 | Disposition: A | Discharge status: Home / Self Care | Payer: BC Managed Care – PPO | Source: Ambulatory Visit | Attending: Women's Health | Admitting: Women's Health

## 2015-01-05 DIAGNOSIS — Z1231 Encounter for screening mammogram for malignant neoplasm of breast: Secondary | ICD-10-CM | POA: Diagnosis not present

## 2016-03-19 ENCOUNTER — Other Ambulatory Visit: Payer: Self-pay | Admitting: Women's Health

## 2016-03-19 DIAGNOSIS — Z1231 Encounter for screening mammogram for malignant neoplasm of breast: Secondary | ICD-10-CM

## 2016-03-27 ENCOUNTER — Ambulatory Visit
Admission: RE | Admit: 2016-03-27 | Discharge: 2016-03-27 | Disposition: A | Discharge status: Home / Self Care | Payer: BC Managed Care – PPO | Source: Ambulatory Visit | Attending: Women's Health | Admitting: Women's Health

## 2016-03-27 DIAGNOSIS — Z1231 Encounter for screening mammogram for malignant neoplasm of breast: Secondary | ICD-10-CM

## 2016-04-25 ENCOUNTER — Encounter: Payer: Self-pay | Admitting: Women's Health

## 2016-04-25 ENCOUNTER — Ambulatory Visit (INDEPENDENT_AMBULATORY_CARE_PROVIDER_SITE_OTHER): Payer: BC Managed Care – PPO | Admitting: Women's Health

## 2016-04-25 VITALS — BP 120/76 | Ht 65.0 in | Wt 170.0 lb

## 2016-04-25 DIAGNOSIS — Z780 Asymptomatic menopausal state: Secondary | ICD-10-CM | POA: Diagnosis not present

## 2016-04-25 DIAGNOSIS — Z01419 Encounter for gynecological examination (general) (routine) without abnormal findings: Secondary | ICD-10-CM | POA: Diagnosis not present

## 2016-04-25 MED ORDER — ALPRAZOLAM 0.25 MG PO TABS: 0.2500 mg | ORAL_TABLET | Freq: Every evening | ORAL | 1 refills | Status: DC | PRN

## 2016-04-25 NOTE — Patient Instructions (Signed)

## 2016-04-25 NOTE — Progress Notes (Signed)
Susan Benjamin 12-Jul-1956 [id number]    History:    Presents for annual exam.  2012 TVH for D UPT had been on Estrace but stopped this past year. 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 Normal mammogram history. 2015 DEXA +1.7 at spine. 2011 benign colon polyp 10 year follow-up.   Past medical history, past surgical history, family history and social history were all reviewed and documented in the EPIC chart. 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 Father died from cancer, mother died from heart disease both within 3 weeks of each other. Walks daily and bikes often. 2 children and 3 grandchildren all doing well. Daughter recently graduated as a Marine scientist.  ROS:  A ROS was performed and pertinent positives and negatives are included.  Exam:  Vitals:   04/25/16 0834  BP: 120/76  Weight: 170 lb (77.1 kg)  Height: 5\' 5"  (1.651 m)   Body mass index is 28.29 kg/m.   General appearance:  Normal Thyroid:  Symmetrical, normal in size, without palpable masses or nodularity. Respiratory  Auscultation:  Clear without wheezing or rhonchi Cardiovascular  Auscultation:  Regular rate, without rubs, murmurs or gallops  Edema/varicosities:  Not grossly evident Abdominal  Soft,nontender, without masses, guarding or rebound.  Liver/spleen:  No organomegaly noted  Hernia:  None appreciated  Skin  Inspection:  Grossly normal   Breasts: Examined lying and sitting.     Right: Without masses, retractions, discharge or axillary adenopathy.     Left: Without masses, retractions, discharge or axillary adenopathy. Gentitourinary   Inguinal/mons:  Normal without inguinal adenopathy  External genitalia:  Normal  BUS/Urethra/Skene's glands:  Normal  Vagina:  Normal  Cervix: And uterus absent   Adnexa/parametria:     Rt: Without masses or tenderness.   Lt: Without masses or tenderness.  Anus and perineum: Normal  Digital rectal exam: Normal sphincter tone without palpated masses or  tenderness  Assessment/Plan:  60 y.o. MWF G2 P2 for annual exam with no complaints.  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 Continue healthy lifestyle of regular exercise, calcium rich diet, vitamin D 1000 daily encouraged. UA, Zostavax at 60 encouraged. Xanax 0.25 for occasional stress uses less than one prescription annually. Aware it is addictive.    Huel [facility], 9:13 AM 04/25/2016

## 2016-04-26 LAB — URINALYSIS W MICROSCOPIC + REFLEX CULTURE
BACTERIA UA: NONE SEEN [HPF]
Bilirubin Urine: NEGATIVE
CRYSTALS: NONE SEEN [HPF]
Casts: NONE SEEN [LPF]
Glucose, UA: NEGATIVE
Hgb urine dipstick: NEGATIVE
Ketones, ur: NEGATIVE
Nitrite: NEGATIVE
PROTEIN: NEGATIVE
RBC / HPF: NONE SEEN RBC/HPF (ref <=2)
Specific Gravity, Urine: 1.014 (ref 1.001–1.035)
Squamous Epithelial / LPF: NONE SEEN [HPF] (ref <=5)
WBC, UA: NONE SEEN WBC/HPF (ref <=5)
YEAST: NONE SEEN [HPF]
pH: 6 (ref 5.0–8.0)

## 2016-04-27 LAB — URINE CULTURE: ORGANISM ID, BACTERIA: NO GROWTH

## 2017-01-16 ENCOUNTER — Encounter: Payer: Self-pay | Admitting: Gynecology

## 2017-06-10 ENCOUNTER — Other Ambulatory Visit: Payer: Self-pay | Admitting: Women's Health

## 2017-06-10 DIAGNOSIS — Z1239 Encounter for other screening for malignant neoplasm of breast: Secondary | ICD-10-CM

## 2017-06-26 ENCOUNTER — Ambulatory Visit
Admission: RE | Admit: 2017-06-26 | Discharge: 2017-06-26 | Disposition: A | Discharge status: Home / Self Care | Payer: BC Managed Care – PPO | Source: Ambulatory Visit | Attending: Women's Health | Admitting: Women's Health

## 2017-06-26 DIAGNOSIS — Z1239 Encounter for other screening for malignant neoplasm of breast: Secondary | ICD-10-CM

## 2018-03-12 ENCOUNTER — Encounter: Payer: Self-pay | Admitting: Women's Health

## 2018-03-12 ENCOUNTER — Ambulatory Visit (INDEPENDENT_AMBULATORY_CARE_PROVIDER_SITE_OTHER): Payer: BC Managed Care – PPO | Admitting: Women's Health

## 2018-03-12 VITALS — BP 144/90 | Ht 64.5 in | Wt 175.6 lb

## 2018-03-12 DIAGNOSIS — Z01419 Encounter for gynecological examination (general) (routine) without abnormal findings: Secondary | ICD-10-CM

## 2018-03-12 DIAGNOSIS — Z13220 Encounter for screening for lipoid disorders: Secondary | ICD-10-CM | POA: Diagnosis not present

## 2018-03-12 NOTE — Progress Notes (Signed)
Susan Benjamin [birth year] [id number]    History:    Presents for annual exam.  2012 TVH for DUB on no HRT.  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  Normal mammogram history.  2015 DEXA +1.7 at spine.  2009 benign colon polyp 10-year follow-up.  Blood pressure elevated, states has had some elevations at home as well.  Past medical history, past surgical history, family history and social history were all reviewed and documented in the EPIC chart.  Control and instrumentation engineer.  Father died of lung cancer, mother died of heart disease.  2 children daughter is a Marine scientist also has 4 grandchildren all doing well.  ROS:  A ROS was performed and pertinent positives and negatives are included.  Exam:  Vitals:   03/12/18 1101  BP: (!) 144/90  Weight: 175 lb 9.6 oz (79.7 kg)  Height: 5' 4.5" (1.638 m)   Body mass index is 29.68 kg/m.   General appearance:  Normal Thyroid:  Symmetrical, normal in size, without palpable masses or nodularity. Respiratory  Auscultation:  Clear without wheezing or rhonchi Cardiovascular  Auscultation:  Regular rate, without rubs, murmurs or gallops  Edema/varicosities:  Not grossly evident Abdominal  Soft,nontender, without masses, guarding or rebound.  Liver/spleen:  No organomegaly noted  Hernia:  None appreciated  Skin  Inspection:  Grossly normal   Breasts: Examined lying and sitting.     Right: Without masses, retractions, discharge or axillary adenopathy.     Left: Without masses, retractions, discharge or axillary adenopathy. Gentitourinary   Inguinal/mons:  Normal without inguinal adenopathy  External genitalia:  Normal  BUS/Urethra/Skene's glands:  Normal  Vagina:  Normal  Cervix: And uterus absent  Adnexa/parametria:     Rt: Without masses or tenderness.   Lt: Without masses or tenderness.  Anus and perineum: Normal  Digital rectal exam: Normal sphincter tone without palpated masses or tenderness  Assessment/Plan:  62 y.o. MWF G5, P2 for annual  exam with occasional nausea only in the morning.  2012 TVH for DUB Obesity Borderline blood pressure  Plan: Instructed to repeat blood pressure at home and follow-up with primary care with several blood pressure readings for possible medication, low-salt diet, exercise, weight loss can also help.  Encouraged Shingrex when follows up with primary care.  SBE's, continue annual screening mammogram, calcium rich foods, vitamin D 2000 daily encouraged.  Reviewed possible reflux causing nausea in the a.m., instructed to discuss one has follow-up colonoscopy this year.  Continue to decrease calorie/carbs and increase exercise.  Repeat DEXA in a year.  CBC, CMP, lipid panel,   Huel [facility], 11:23 AM 03/12/2018

## 2018-03-12 NOTE — Patient Instructions (Signed)
Food Choices for Gastroesophageal Reflux Disease, Adult When you have gastroesophageal reflux disease (GERD), the foods you eat and your eating habits are very important. Choosing the right foods can help ease your discomfort. What guidelines do I need to follow?  Choose fruits, vegetables, whole grains, and low-fat dairy products.  Choose low-fat meat, fish, and poultry.  Limit fats such as oils, salad dressings, butter, nuts, and avocado.  Keep a food diary. This helps you identify foods that cause symptoms.  Avoid foods that cause symptoms. These may be different for everyone.  Eat small meals often instead of 3 large meals a day.  Eat your meals slowly, in a place where you are relaxed.  Limit fried foods.  Cook foods using methods other than frying.  Avoid drinking alcohol.  Avoid drinking large amounts of liquids with your meals.  Avoid bending over or lying down until 2-3 hours after eating. What foods are not recommended? These are some foods and drinks that may make your symptoms worse: Vegetables Tomatoes. Tomato juice. Tomato and spaghetti sauce. Chili peppers. Onion and garlic. Horseradish. Fruits Oranges, grapefruit, and lemon (fruit and juice). Meats High-fat meats, fish, and poultry. This includes hot dogs, ribs, ham, sausage, salami, and bacon. Dairy Whole milk and chocolate milk. Sour cream. Cream. Butter. Ice cream. Cream cheese. Drinks Coffee and tea. Bubbly (carbonated) drinks or energy drinks. Condiments Hot sauce. Barbecue sauce. Sweets/Desserts Chocolate and cocoa. Donuts. Peppermint and spearmint. Fats and Oils High-fat foods. This includes [place] fries and potato chips. Other Vinegar. Strong spices. This includes black pepper, white pepper, red pepper, cayenne, curry powder, cloves, ginger, and chili powder. The items listed above may not be a complete list of foods and drinks to avoid. Contact your dietitian for more information. This  information is not intended to replace advice given to you by your health care provider. Make sure you discuss any questions you have with your health care provider. Document Released: 02/19/2012 Document Revised: 01/26/2016 Document Reviewed: 06/24/2013 Elsevier Interactive Patient Education  2017 [employer] Maintenance for Postmenopausal Women Menopause is a normal process in which your reproductive ability comes to an end. This process happens gradually over a span of months to years, usually between the ages of 88 and 35. Menopause is complete when you have missed 12 consecutive menstrual periods. It is important to talk with your health care provider about some of the most common conditions that affect postmenopausal women, such as heart disease, cancer, and bone loss (osteoporosis). Adopting a healthy lifestyle and getting preventive care can help to promote your health and wellness. Those actions can also lower your chances of developing some of these common conditions. What should I know about menopause? During menopause, you may experience a number of symptoms, such as:  Moderate-to-severe hot flashes.  Night sweats.  Decrease in sex drive.  Mood swings.  Headaches.  Tiredness.  Irritability.  Memory problems.  Insomnia.  Choosing to treat or not to treat menopausal changes is an individual decision that you make with your health care provider. What should I know about hormone replacement therapy and supplements? Hormone therapy products are effective for treating symptoms that are associated with menopause, such as hot flashes and night sweats. Hormone replacement carries certain risks, especially as you become older. If you are thinking about using estrogen or estrogen with progestin treatments, discuss the benefits and risks with your health care provider. What should I know about heart disease and stroke? Heart disease, heart attack, and  stroke become more  likely as you age. This may be due, in part, to the hormonal changes that your body experiences during menopause. These can affect how your body processes dietary fats, triglycerides, and cholesterol. Heart attack and stroke are both medical emergencies. There are many things that you can do to help prevent heart disease and stroke:  Have your blood pressure checked at least every 1-2 years. High blood pressure causes heart disease and increases the risk of stroke.  If you are 44-16 years old, ask your health care provider if you should take aspirin to prevent a heart attack or a stroke.  Do not use any tobacco products, including cigarettes, chewing tobacco, or electronic cigarettes. If you need help quitting, ask your health care provider.  It is important to eat a healthy diet and maintain a healthy weight. ? Be sure to include plenty of vegetables, fruits, low-fat dairy products, and lean protein. ? Avoid eating foods that are high in solid fats, added sugars, or salt (sodium).  Get regular exercise. This is one of the most important things that you can do for your health. ? Try to exercise for at least 150 minutes each week. The type of exercise that you do should increase your heart rate and make you sweat. This is known as moderate-intensity exercise. ? Try to do strengthening exercises at least twice each week. Do these in addition to the moderate-intensity exercise.  Know your numbers.Ask your health care provider to check your cholesterol and your blood glucose. Continue to have your blood tested as directed by your health care provider.  What should I know about cancer screening? There are several types of cancer. Take the following steps to reduce your risk and to catch any cancer development as early as possible. Breast Cancer  Practice breast self-awareness. ? This means understanding how your breasts normally appear and feel. ? It also means doing regular breast self-exams.  Let your health care provider know about any changes, no matter how small.  If you are 79 or older, have a clinician do a breast exam (clinical breast exam or CBE) every year. Depending on your age, family history, and medical history, it may be recommended that you also have a yearly breast X-ray (mammogram).  If you have a family history of breast cancer, talk with your health care provider about genetic screening.  If you are at high risk for breast cancer, talk with your health care provider about having an MRI and a mammogram every year.  Breast cancer (BRCA) gene test is recommended for women who have family members with BRCA-related cancers. Results of the assessment will determine the need for genetic counseling and BRCA1 and for BRCA2 testing. BRCA-related cancers include these types: ? Breast. This occurs in males or females. ? Ovarian. ? Tubal. This may also be called fallopian tube cancer. ? Cancer of the abdominal or pelvic lining (peritoneal cancer). ? Prostate. ? Pancreatic.  Cervical, Uterine, and Ovarian Cancer Your health care provider may recommend that you be screened regularly for cancer of the pelvic organs. These include your ovaries, uterus, and vagina. This screening involves a pelvic exam, which includes checking for microscopic changes to the surface of your cervix (Pap test).  For women ages 21-65, health care providers may recommend a pelvic exam and a Pap test every three years. For women ages 14-65, they may recommend the Pap test and pelvic exam, combined with testing for human papilloma virus (HPV), every five years.  Some types of HPV increase your risk of cervical cancer. Testing for HPV may also be done on women of any age who have unclear Pap test results.  Other health care providers may not recommend any screening for nonpregnant women who are considered low risk for pelvic cancer and have no symptoms. Ask your health care provider if a screening pelvic exam  is right for you.  If you have had past treatment for cervical cancer or a condition that could lead to cancer, you need Pap tests and screening for cancer for at least 20 years after your treatment. If Pap tests have been discontinued for you, your risk factors (such as having a new sexual partner) need to be reassessed to determine if you should start having screenings again. Some women have medical problems that increase the chance of getting cervical cancer. In these cases, your health care provider may recommend that you have screening and Pap tests more often.  If you have a family history of uterine cancer or ovarian cancer, talk with your health care provider about genetic screening.  If you have vaginal bleeding after reaching menopause, tell your health care provider.  There are currently no reliable tests available to screen for ovarian cancer.  Lung Cancer Lung cancer screening is recommended for adults 41-20 years old who are at high risk for lung cancer because of a history of smoking. A yearly low-dose CT scan of the lungs is recommended if you:  Currently smoke.  Have a history of at least 30 pack-years of smoking and you currently smoke or have quit within the past 15 years. A pack-year is smoking an average of one pack of cigarettes per day for one year.  Yearly screening should:  Continue until it has been 15 years since you quit.  Stop if you develop a health problem that would prevent you from having lung cancer treatment.  Colorectal Cancer  This type of cancer can be detected and can often be prevented.  Routine colorectal cancer screening usually begins at age 18 and continues through age 48.  If you have risk factors for colon cancer, your health care provider may recommend that you be screened at an earlier age.  If you have a family history of colorectal cancer, talk with your health care provider about genetic screening.  Your health care provider may also  recommend using home test kits to check for hidden blood in your stool.  A small camera at the end of a tube can be used to examine your colon directly (sigmoidoscopy or colonoscopy). This is done to check for the earliest forms of colorectal cancer.  Direct examination of the colon should be repeated every 5-10 years until age 49. However, if early forms of precancerous polyps or small growths are found or if you have a family history or genetic risk for colorectal cancer, you may need to be screened more often.  Skin Cancer  Check your skin from head to toe regularly.  Monitor any moles. Be sure to tell your health care provider: ? About any new moles or changes in moles, especially if there is a change in a mole's shape or color. ? If you have a mole that is larger than the size of a pencil eraser.  If any of your family members has a history of skin cancer, especially at a Dearl Rudden age, talk with your health care provider about genetic screening.  Always use sunscreen. Apply sunscreen liberally and repeatedly throughout the  day.  Whenever you are outside, protect yourself by wearing long sleeves, pants, a wide-brimmed hat, and sunglasses.  What should I know about osteoporosis? Osteoporosis is a condition in which bone destruction happens more quickly than new bone creation. After menopause, you may be at an increased risk for osteoporosis. To help prevent osteoporosis or the bone fractures that can happen because of osteoporosis, the following is recommended:  If you are 33-32 years old, get at least 1,000 mg of calcium and at least 600 mg of vitamin D per day.  If you are older than age 45 but younger than age 48, get at least 1,200 mg of calcium and at least 600 mg of vitamin D per day.  If you are older than age 19, get at least 1,200 mg of calcium and at least 800 mg of vitamin D per day.  Smoking and excessive alcohol intake increase the risk of osteoporosis. Eat foods that are  rich in calcium and vitamin D, and do weight-bearing exercises several times each week as directed by your health care provider. What should I know about how menopause affects my mental health? Depression may occur at any age, but it is more common as you become older. Common symptoms of depression include:  Low or sad mood.  Changes in sleep patterns.  Changes in appetite or eating patterns.  Feeling an overall lack of motivation or enjoyment of activities that you previously enjoyed.  Frequent crying spells.  Talk with your health care provider if you think that you are experiencing depression. What should I know about immunizations? It is important that you get and maintain your immunizations. These include:  Tetanus, diphtheria, and pertussis (Tdap) booster vaccine.  Influenza every year before the flu season begins.  Pneumonia vaccine.  Shingles vaccine.  Your health care provider may also recommend other immunizations. This information is not intended to replace advice given to you by your health care provider. Make sure you discuss any questions you have with your health care provider. Document Released: 10/12/2005 Document Revised: 03/09/2016 Document Reviewed: 05/24/2015 Elsevier Interactive Patient Education  2018 [name].

## 2018-03-13 LAB — CBC WITH DIFFERENTIAL/PLATELET
BASOS ABS: 62 {cells}/uL (ref 0–200)
BASOS PCT: 1 %
EOS ABS: 68 {cells}/uL (ref 15–500)
Eosinophils Relative: 1.1 %
HCT: 40.6 % (ref 35.0–45.0)
Hemoglobin: 13.6 g/dL (ref 11.7–15.5)
Lymphs Abs: 1879 cells/uL (ref 850–3900)
MCH: 28.9 pg (ref 27.0–33.0)
MCHC: 33.5 g/dL (ref 32.0–36.0)
MCV: 86.4 fL (ref 80.0–100.0)
MONOS PCT: 7.7 %
MPV: 10.8 fL (ref 7.5–12.5)
NEUTROS PCT: 59.9 %
Neutro Abs: 3714 cells/uL (ref 1500–7800)
PLATELETS: 280 10*3/uL (ref 140–400)
RBC: 4.7 10*6/uL (ref 3.80–5.10)
RDW: 12.6 % (ref 11.0–15.0)
TOTAL LYMPHOCYTE: 30.3 %
WBC mixed population: 477 cells/uL (ref 200–950)
WBC: 6.2 10*3/uL (ref 3.8–10.8)

## 2018-03-13 LAB — COMPREHENSIVE METABOLIC PANEL
AG RATIO: 1.7 (calc) (ref 1.0–2.5)
ALT: 14 U/L (ref 6–29)
AST: 15 U/L (ref 10–35)
Albumin: 4.5 g/dL (ref 3.6–5.1)
Alkaline phosphatase (APISO): 74 U/L (ref 33–130)
BILIRUBIN TOTAL: 0.5 mg/dL (ref 0.2–1.2)
BUN: 12 mg/dL (ref 7–25)
CALCIUM: 9.4 mg/dL (ref 8.6–10.4)
CO2: 26 mmol/L (ref 20–32)
Chloride: 102 mmol/L (ref 98–110)
Creat: 0.82 mg/dL (ref 0.50–0.99)
Globulin: 2.6 g/dL (calc) (ref 1.9–3.7)
Glucose, Bld: 99 mg/dL (ref 65–99)
Potassium: 4.4 mmol/L (ref 3.5–5.3)
Sodium: 138 mmol/L (ref 135–146)
Total Protein: 7.1 g/dL (ref 6.1–8.1)

## 2018-03-13 LAB — LIPID PANEL
Cholesterol: 217 mg/dL — ABNORMAL HIGH (ref <200)
HDL: 42 mg/dL — AB (ref >50)
LDL Cholesterol (Calc): 143 mg/dL (calc) — ABNORMAL HIGH
Non-HDL Cholesterol (Calc): 175 mg/dL (calc) — ABNORMAL HIGH (ref <130)
TRIGLYCERIDES: 183 mg/dL — AB (ref <150)
Total CHOL/HDL Ratio: 5.2 (calc) — ABNORMAL HIGH (ref <5.0)

## 2018-09-26 ENCOUNTER — Other Ambulatory Visit: Payer: Self-pay | Admitting: Women's Health

## 2018-09-26 DIAGNOSIS — Z1231 Encounter for screening mammogram for malignant neoplasm of breast: Secondary | ICD-10-CM

## 2018-11-21 ENCOUNTER — Ambulatory Visit: Payer: BC Managed Care – PPO

## 2019-01-13 ENCOUNTER — Ambulatory Visit: Payer: BC Managed Care – PPO

## 2019-01-23 ENCOUNTER — Ambulatory Visit: Payer: BC Managed Care – PPO

## 2019-03-17 ENCOUNTER — Ambulatory Visit
Admission: RE | Admit: 2019-03-17 | Discharge: 2019-03-17 | Disposition: A | Discharge status: Home / Self Care | Payer: BC Managed Care – PPO | Source: Ambulatory Visit | Attending: Women's Health | Admitting: Women's Health

## 2019-03-17 ENCOUNTER — Other Ambulatory Visit: Payer: Self-pay

## 2019-03-17 DIAGNOSIS — Z1231 Encounter for screening mammogram for malignant neoplasm of breast: Secondary | ICD-10-CM

## 2019-03-18 ENCOUNTER — Encounter: Payer: Self-pay | Admitting: Women's Health

## 2019-05-09 ENCOUNTER — Other Ambulatory Visit: Payer: Self-pay

## 2019-05-09 ENCOUNTER — Ambulatory Visit
Admission: EM | Admit: 2019-05-09 | Discharge: 2019-05-09 | Disposition: A | Discharge status: Home / Self Care | Payer: BC Managed Care – PPO | Attending: Physician Assistant | Admitting: Physician Assistant

## 2019-05-09 ENCOUNTER — Encounter: Payer: Self-pay | Admitting: Physician Assistant

## 2019-05-09 ENCOUNTER — Ambulatory Visit: Payer: BC Managed Care – PPO

## 2019-05-09 DIAGNOSIS — Z20828 Contact with and (suspected) exposure to other viral communicable diseases: Secondary | ICD-10-CM

## 2019-05-09 DIAGNOSIS — J209 Acute bronchitis, unspecified: Secondary | ICD-10-CM | POA: Diagnosis not present

## 2019-05-09 MED ORDER — PREDNISONE 50 MG PO TABS: 50.0000 mg | ORAL_TABLET | Freq: Every day | ORAL | 0 refills | Status: DC

## 2019-05-09 MED ORDER — DOXYCYCLINE HYCLATE 100 MG PO CAPS: 100.0000 mg | ORAL_CAPSULE | Freq: Two times a day (BID) | ORAL | 0 refills | Status: DC

## 2019-05-09 MED ORDER — ALBUTEROL SULFATE HFA 108 (90 BASE) MCG/ACT IN AERS: 1.0000 | INHALATION_SPRAY | Freq: Four times a day (QID) | RESPIRATORY_TRACT | 0 refills | Status: DC | PRN

## 2019-05-09 NOTE — ED Triage Notes (Signed)
Per pt she has been having a cough for a while with some chest pressure. Pt has been exposed to covid positive patient in the past few days. Pt has had no fever, no chils

## 2019-05-09 NOTE — Discharge Instructions (Signed)
As discussed, cannot rule out COVID. Currently, no alarming signs. Testing ordered. I would like you to quarantine until testing results. Prednisone, doxycycline as directed. Albuterol as needed for cough/shortness of breath. If experiencing shortness of breath, trouble breathing, call 911 and provide them with your current situation.

## 2019-05-09 NOTE — ED Provider Notes (Signed)
[facility]    CSN: [id number] Arrival date & time: 05/09/19  1349      History   Chief Complaint Chief Complaint  Patient presents with  . Cough  . Chest Pain    HPI Susan Benjamin is a 63 y.o. female.   63 year old female comes in for few week history of cough. States states she first started having cough a few months ago. At the time, she had rhinorrhea, nasal congestion as well. These symptoms resolved, but cough continued, though significantly improved. A few weeks ago, she began having worsening cough. Cough can be productive at times. Denies worsening at night. Denies fever, chills, body aches. Denies loss of taste/smell. She has some chest pressure with cough. Has mild shortness of breath with cough and exertion. Denies exertional chest pain, exertional fatigue. Had positive COVID contact. Never smoker. Has not tried anything for the symptoms.      Past Medical History:  Diagnosis Date  . Anxiety   . CIN I (cervical intraepithelial neoplasia I)   . Ectopic pregnancy    X 2  . Endometrial polyp   . Seasonal allergies     Patient Active Problem List   Diagnosis Date Noted  . Benign colon polyp 10/10/2012    Past Surgical History:  Procedure Laterality Date  . CARPAL TUNNEL RELEASE    . CERVICAL BIOPSY  W/ LOOP ELECTRODE EXCISION  2003  . DILATION AND CURETTAGE OF UTERUS  2011  . ECTOPIC PREGNANCY SURGERY     X 2-Right and Left salpingectomy  . FOOT SURGERY    . HYSTEROSCOPY  2011   endometrial polyp  . TUBAL LIGATION     tubal lig and tubal reversal  . VAGINAL HYSTERECTOMY  2012    OB History    Gravida  5   Para  2   Term  2   Preterm      AB  3   Living  2     SAB      TAB      Ectopic  2   Multiple      Live Births               Home Medications    Prior to Admission medications   Medication Sig Start Date End Date Taking? Authorizing Provider  albuterol (VENTOLIN HFA) 108 (90 Base) MCG/ACT inhaler Inhale  1-2 puffs into the lungs every 6 (six) hours as needed for wheezing or shortness of breath. 05/09/19   Tasia Catchings, Amy V, PA-C  doxycycline (VIBRAMYCIN) 100 MG capsule Take 1 capsule (100 mg total) by mouth 2 (two) times daily. 05/09/19   Tasia Catchings, Amy V, PA-C  fluticasone (FLONASE) 50 MCG/ACT nasal spray Place 2 sprays into both nostrils daily. 11/17/14   Gregor Hams, MD  ibuprofen (ADVIL,MOTRIN) 200 MG tablet Take 400-800 mg by mouth every 6 (six) hours as needed for mild pain or moderate pain.    [provider]  loratadine (CLARITIN) 10 MG tablet Take 10 mg by mouth daily as needed for allergies.     [provider]  predniSONE (DELTASONE) 50 MG tablet Take 1 tablet (50 mg total) by mouth daily with breakfast. 05/09/19   Ok Edwards, PA-C    Family History Family History  Problem Relation Age of Onset  . Diabetes Mother   . Hypertension Mother   . Stroke Mother   . Heart disease Mother   . Hypertension Father  Social History Social History   Tobacco Use  . Smoking status: Never Smoker  . Smokeless tobacco: Never Used  Substance Use Topics  . Alcohol use: Yes    Alcohol/week: 0.0 standard drinks    Comment: rarely  . Drug use: No     Allergies   Patient has no known allergies.   Review of Systems Review of Systems  Reason unable to perform ROS: See HPI as above.     Physical Exam Triage Vital Signs ED Triage Vitals  Enc Vitals Group     BP 05/09/19 1358 (!) 144/84     Pulse Rate 05/09/19 1358 79     Resp 05/09/19 1358 16     Temp 05/09/19 1358 98 F (36.7 C)     Temp Source 05/09/19 1358 Oral     SpO2 05/09/19 1358 97 %     Weight --      Height --      Head Circumference --      Peak Flow --      Pain Score 05/09/19 1359 0     Pain Loc --      Pain Edu? --      Excl. in [place]? --    No data found.  Updated Vital Signs BP (!) 144/84 (BP Location: Right Arm)   Pulse 79   Temp 98 F (36.7 C) (Oral)   Resp 16   SpO2 97%   Physical Exam  Constitutional:      General: She is not in acute distress.    Appearance: Normal appearance. She is well-developed. She is not ill-appearing, toxic-appearing or diaphoretic.  HENT:     Head: Normocephalic and atraumatic.     Mouth/Throat:     Mouth: Mucous membranes are moist.     Pharynx: Oropharynx is clear. Uvula midline.  Neck:     Musculoskeletal: Normal range of motion and neck supple.  Cardiovascular:     Rate and Rhythm: Normal rate and regular rhythm.     Heart sounds: Normal heart sounds. No murmur. No friction rub. No gallop.   Pulmonary:     Effort: Pulmonary effort is normal. No accessory muscle usage, prolonged expiration, respiratory distress or retractions.     Comments: Lungs clear to auscultation without adventitious lung sounds. Musculoskeletal:     Right lower leg: She exhibits no tenderness. No edema.     Left lower leg: She exhibits no tenderness. No edema.  Skin:    General: Skin is warm and dry.  Neurological:     General: No focal deficit present.     Mental Status: She is alert and oriented to person, place, and time.    UC Treatments / Results  Labs (all labs ordered are listed, but only abnormal results are displayed) Labs Reviewed  NOVEL CORONAVIRUS, NAA    EKG   Radiology No results found.  Procedures Procedures (including critical care time)  Medications Ordered in UC Medications - No data to display  Initial Impression / Assessment and Plan / UC Course  I have reviewed the triage vital signs and the nursing notes.  Pertinent labs & imaging results that were available during my care of the patient were reviewed by me and considered in my medical decision making (see chart for details).     No alarming signs on exam.  Patient speaking in full sentences without respiratory distress.  COVID testing ordered.  Patient to quarantine until testing results return.  Given few week history  of cough, will start doxycycline and prednisone for  bronchitis. Albuterol inhaler as needed. Push fluids.  Return precautions given.  Patient expresses understanding and agrees to plan.  Final Clinical Impressions(s) / UC Diagnoses   Final diagnoses:  Acute bronchitis, unspecified organism   ED Prescriptions    Medication Sig Dispense Auth. Provider   doxycycline (VIBRAMYCIN) 100 MG capsule Take 1 capsule (100 mg total) by mouth 2 (two) times daily. 20 capsule Yu, Amy V, PA-C   predniSONE (DELTASONE) 50 MG tablet Take 1 tablet (50 mg total) by mouth daily with breakfast. 5 tablet Yu, Amy V, PA-C   albuterol (VENTOLIN HFA) 108 (90 Base) MCG/ACT inhaler Inhale 1-2 puffs into the lungs every 6 (six) hours as needed for wheezing or shortness of breath. 8 g Tobin Chad, [place] 05/09/19 1453

## 2019-05-14 ENCOUNTER — Encounter (HOSPITAL_COMMUNITY): Payer: Self-pay

## 2019-05-14 LAB — NOVEL CORONAVIRUS, NAA: SARS-CoV-2, NAA: NOT DETECTED

## 2019-09-03 ENCOUNTER — Ambulatory Visit (INDEPENDENT_AMBULATORY_CARE_PROVIDER_SITE_OTHER): Payer: BC Managed Care – PPO

## 2019-09-03 ENCOUNTER — Ambulatory Visit
Admission: EM | Admit: 2019-09-03 | Discharge: 2019-09-03 | Disposition: A | Discharge status: Home / Self Care | Payer: BC Managed Care – PPO | Attending: Physician Assistant | Admitting: Physician Assistant

## 2019-09-03 ENCOUNTER — Other Ambulatory Visit: Payer: Self-pay

## 2019-09-03 DIAGNOSIS — R05 Cough: Secondary | ICD-10-CM

## 2019-09-03 DIAGNOSIS — Z20828 Contact with and (suspected) exposure to other viral communicable diseases: Secondary | ICD-10-CM | POA: Diagnosis not present

## 2019-09-03 DIAGNOSIS — R059 Cough, unspecified: Secondary | ICD-10-CM

## 2019-09-03 DIAGNOSIS — Z20822 Contact with and (suspected) exposure to covid-19: Secondary | ICD-10-CM

## 2019-09-03 MED ORDER — ALBUTEROL SULFATE HFA 108 (90 BASE) MCG/ACT IN AERS: 1.0000 | INHALATION_SPRAY | Freq: Four times a day (QID) | RESPIRATORY_TRACT | 0 refills | Status: DC | PRN

## 2019-09-03 MED ORDER — BENZONATATE 200 MG PO CAPS: 200.0000 mg | ORAL_CAPSULE | Freq: Three times a day (TID) | ORAL | 0 refills | Status: DC

## 2019-09-03 MED ORDER — IPRATROPIUM BROMIDE 0.06 % NA SOLN: 2.0000 | Freq: Four times a day (QID) | NASAL | 0 refills | Status: DC

## 2019-09-03 NOTE — Discharge Instructions (Signed)
COVID PCR testing ordered. I would like you to quarantine until testing results. Start tessalon for cough. Restart albuterol inhaler as needed. Atrovent for nasal drainage. If experiencing shortness of breath, trouble breathing, go to the emergency department for further evaluation needed.   If after current symptoms, still have lingering cough, will need to follow up with lung doctor for chronic cough.

## 2019-09-03 NOTE — ED Provider Notes (Signed)
[facility]    CSN: [id number] Arrival date & time: 09/03/19  1224      History   Chief Complaint Chief Complaint  Patient presents with  . Cough    HPI Susan Benjamin is a 63 y.o. female.   63 year old female returns to clinic for continued coughing with worsening symptoms after being seen 05/2019. At the time she has few week history of cough, and was put on doxycycline, prednisone, albuterol. Patient states symptoms resolved for about 2 weeks, but returned afterwards. Dry cough with chest tightness. Had nasal congestion. Can wake up at night with coughing. Denies chest pain, exertional fatigue, exertional chest pain. States activity will cause the cough.  In addition to chronic cough, had positive COVID exposure 08/29/2019. Started having congestion, chills, body aches, fatigue for the past 2 days. Denies fever, worsening cough. Denies abdominal pain, nausea, vomiting, diarrhea. Denies loss of taste/smell      Past Medical History:  Diagnosis Date  . Anxiety   . CIN I (cervical intraepithelial neoplasia I)   . Ectopic pregnancy    X 2  . Endometrial polyp   . Seasonal allergies     Patient Active Problem List   Diagnosis Date Noted  . Benign colon polyp 10/10/2012    Past Surgical History:  Procedure Laterality Date  . CARPAL TUNNEL RELEASE    . CERVICAL BIOPSY  W/ LOOP ELECTRODE EXCISION  2003  . DILATION AND CURETTAGE OF UTERUS  2011  . ECTOPIC PREGNANCY SURGERY     X 2-Right and Left salpingectomy  . FOOT SURGERY    . HYSTEROSCOPY  2011   endometrial polyp  . TUBAL LIGATION     tubal lig and tubal reversal  . VAGINAL HYSTERECTOMY  2012    OB History    Gravida  5   Para  2   Term  2   Preterm      AB  3   Living  2     SAB      TAB      Ectopic  2   Multiple      Live Births               Home Medications    Prior to Admission medications   Medication Sig Start Date End Date Taking? Authorizing Provider    albuterol (VENTOLIN HFA) 108 (90 Base) MCG/ACT inhaler Inhale 1-2 puffs into the lungs every 6 (six) hours as needed for wheezing or shortness of breath. 09/03/19   Tasia Catchings, Royalty Domagala V, PA-C  benzonatate (TESSALON) 200 MG capsule Take 1 capsule (200 mg total) by mouth every 8 (eight) hours. 09/03/19   Tasia Catchings, Reegan Mctighe V, PA-C  ipratropium (ATROVENT) 0.06 % nasal spray Place 2 sprays into both nostrils 4 (four) times daily. 09/03/19   Ok Edwards, PA-C    Family History Family History  Problem Relation Age of Onset  . Diabetes Mother   . Hypertension Mother   . Stroke Mother   . Heart disease Mother   . Hypertension Father     Social History Social History   Tobacco Use  . Smoking status: Never Smoker  . Smokeless tobacco: Never Used  Substance Use Topics  . Alcohol use: Yes    Alcohol/week: 0.0 standard drinks    Comment: rarely  . Drug use: No     Allergies   Patient has no known allergies.   Review of Systems Review of Systems  Reason  unable to perform ROS: See HPI as above.     Physical Exam Triage Vital Signs ED Triage Vitals  Enc Vitals Group     BP 09/03/19 1237 (!) 164/94     Pulse Rate 09/03/19 1237 (!) 53     Resp 09/03/19 1237 18     Temp 09/03/19 1237 98.6 F (37 C)     Temp Source 09/03/19 1237 Oral     SpO2 --      Weight --      Height --      Head Circumference --      Peak Flow --      Pain Score 09/03/19 1238 0     Pain Loc --      Pain Edu? --      Excl. in [place]? --    No data found.  Updated Vital Signs BP (!) 164/94 (BP Location: Left Arm)   Pulse (!) 53   Temp 98.6 F (37 C) (Oral)   Resp 18   SpO2 96%   Physical Exam Constitutional:      General: She is not in acute distress.    Appearance: Normal appearance. She is not ill-appearing, toxic-appearing or diaphoretic.  HENT:     Head: Normocephalic and atraumatic.     Mouth/Throat:     Mouth: Mucous membranes are moist.     Pharynx: Oropharynx is clear. Uvula midline.  Cardiovascular:      Rate and Rhythm: Normal rate and regular rhythm.     Heart sounds: Normal heart sounds. No murmur. No friction rub. No gallop.   Pulmonary:     Effort: Pulmonary effort is normal. No accessory muscle usage, prolonged expiration, respiratory distress or retractions.     Comments: Lungs clear to auscultation without adventitious lung sounds. Musculoskeletal:     Cervical back: Normal range of motion and neck supple.  Neurological:     General: No focal deficit present.     Mental Status: She is alert and oriented to person, place, and time.      UC Treatments / Results  Labs (all labs ordered are listed, but only abnormal results are displayed) Labs Reviewed  NOVEL CORONAVIRUS, NAA    EKG   Radiology DG Chest 2 View  Result Date: 09/03/2019 CLINICAL DATA:  Lingering cough for months with sudden worsening. EXAM: CHEST - 2 VIEW COMPARISON:  07/12/2006 FINDINGS: Cardiomediastinal contours are normal. Lungs are clear. Hilar structures are unremarkable. No acute bone process. IMPRESSION: No acute cardiopulmonary disease. Electronically Signed   By: Zetta Bills M.D.   On: 09/03/2019 13:02    Procedures Procedures (including critical care time)  Medications Ordered in UC Medications - No data to display  Initial Impression / Assessment and Plan / UC Course  I have reviewed the triage vital signs and the nursing notes.  Pertinent labs & imaging results that were available during my care of the patient were reviewed by me and considered in my medical decision making (see chart for details).    Chest x-ray negative for acute cardiopulmonary disease.  Although with chronic cough, patient with acute worsening of cough, and developed other URI symptoms.  Also with recent Covid exposure.  Will send for Covid testing, and patient to quarantine until testing results return.  Otherwise will provide symptomatic management and return start albuterol.  Discussed possible active  airway/bronchitis causing chronic cough.  If symptoms do not improving, will have patient follow-up with pulmonologist for further evaluation and management  needed.  Return precautions given.  Patient expresses understanding and agrees to plan.  Final Clinical Impressions(s) / UC Diagnoses   Final diagnoses:  Cough  Exposure to COVID-19 virus   ED Prescriptions    Medication Sig Dispense Auth. Provider   benzonatate (TESSALON) 200 MG capsule Take 1 capsule (200 mg total) by mouth every 8 (eight) hours. 21 capsule Kolbi Altadonna V, PA-C   ipratropium (ATROVENT) 0.06 % nasal spray Place 2 sprays into both nostrils 4 (four) times daily. 15 mL Madina Galati V, PA-C   albuterol (VENTOLIN HFA) 108 (90 Base) MCG/ACT inhaler Inhale 1-2 puffs into the lungs every 6 (six) hours as needed for wheezing or shortness of breath. 8 g Ok Edwards, PA-C     PDMP not reviewed this encounter.   Ok Edwards, PA-C 09/03/19 2034

## 2019-09-03 NOTE — ED Triage Notes (Signed)
Pt c/o a persistent cough x3wks. States was tx'd for same and went away for 2wks and came back. Pt states had a positive covid exposure 12/26. Pt c/o congestion, chills, bodyaches, and fatigue since yesterday

## 2019-09-05 LAB — NOVEL CORONAVIRUS, NAA: SARS-CoV-2, NAA: DETECTED — AB

## 2019-09-07 ENCOUNTER — Telehealth (HOSPITAL_COMMUNITY): Payer: Self-pay | Admitting: Emergency Medicine

## 2019-09-07 NOTE — Telephone Encounter (Signed)

## 2019-09-10 ENCOUNTER — Other Ambulatory Visit: Payer: Self-pay

## 2019-09-10 ENCOUNTER — Encounter: Payer: Self-pay | Admitting: Emergency Medicine

## 2019-09-10 ENCOUNTER — Ambulatory Visit
Admission: EM | Admit: 2019-09-10 | Discharge: 2019-09-10 | Disposition: A | Discharge status: Home / Self Care | Payer: BC Managed Care – PPO | Attending: Emergency Medicine | Admitting: Emergency Medicine

## 2019-09-10 DIAGNOSIS — R059 Cough, unspecified: Secondary | ICD-10-CM

## 2019-09-10 DIAGNOSIS — R05 Cough: Secondary | ICD-10-CM | POA: Diagnosis not present

## 2019-09-10 DIAGNOSIS — U071 COVID-19: Secondary | ICD-10-CM | POA: Diagnosis not present

## 2019-09-10 MED ORDER — AZITHROMYCIN 250 MG PO TABS: 250.0000 mg | ORAL_TABLET | Freq: Every day | ORAL | 0 refills | Status: DC

## 2019-09-10 MED ORDER — GUAIFENESIN-DM 100-10 MG/5ML PO SYRP: 5.0000 mL | ORAL_SOLUTION | ORAL | 0 refills | Status: DC | PRN

## 2019-09-10 MED ORDER — AEROCHAMBER PLUS FLO-VU MEDIUM MISC: 1.0000 | Freq: Once | 0 refills | Status: AC

## 2019-09-10 MED ORDER — PREDNISONE 20 MG PO TABS: 20.0000 mg | ORAL_TABLET | Freq: Every day | ORAL | 0 refills | Status: AC

## 2019-09-10 MED ORDER — LORATADINE 10 MG PO TABS: 10.0000 mg | ORAL_TABLET | Freq: Every day | ORAL | 0 refills | Status: DC

## 2019-09-10 NOTE — ED Triage Notes (Addendum)
PT presents to [facility] for assessment after testing positive for COVID.  Patient states her symptoms have not improved since her last visit (denies getting worse).  Patient c/o continue constant cough, chest pressure/tightness, and fatigue.  Pt states she has not been able to eat much recently, and that she feels light-headed when she is up moving around starting today.

## 2019-09-10 NOTE — ED Provider Notes (Signed)
[facility]    CSN: [id number] Arrival date & time: 09/10/19  0905      History   Chief Complaint Chief Complaint  Patient presents with  . COVID Positive    HPI VENIA NISSIM is a 64 y.o. female with history of seasonal allergies, known COVID-19 infection (diagnosed 12/31) presenting for persistent Covid symptoms, particularly cough and dyspnea.  States dyspnea is worse with exertion: Without associated newness, chest pain, nausea, wheezing.  Patient was previously evaluated for this on 12/21: Given benzonatate, butyryl inhaler, Atrovent nasal spray for symptom management.  States benzonatate is not helpful.  Does have moderate sputum production: Clear/white without blood.    Past Medical History:  Diagnosis Date  . Anxiety   . CIN I (cervical intraepithelial neoplasia I)   . Ectopic pregnancy    X 2  . Endometrial polyp   . Seasonal allergies     Patient Active Problem List   Diagnosis Date Noted  . Benign colon polyp 10/10/2012    Past Surgical History:  Procedure Laterality Date  . CARPAL TUNNEL RELEASE    . CERVICAL BIOPSY  W/ LOOP ELECTRODE EXCISION  2003  . DILATION AND CURETTAGE OF UTERUS  2011  . ECTOPIC PREGNANCY SURGERY     X 2-Right and Left salpingectomy  . FOOT SURGERY    . HYSTEROSCOPY  2011   endometrial polyp  . TUBAL LIGATION     tubal lig and tubal reversal  . VAGINAL HYSTERECTOMY  2012    OB History    Gravida  5   Para  2   Term  2   Preterm      AB  3   Living  2     SAB      TAB      Ectopic  2   Multiple      Live Births               Home Medications    Prior to Admission medications   Medication Sig Start Date End Date Taking? Authorizing Provider  albuterol (VENTOLIN HFA) 108 (90 Base) MCG/ACT inhaler Inhale 1-2 puffs into the lungs every 6 (six) hours as needed for wheezing or shortness of breath. 09/03/19  Yes Yu, Amy V, PA-C  benzonatate (TESSALON) 200 MG capsule Take 1 capsule (200 mg  total) by mouth every 8 (eight) hours. 09/03/19  Yes Yu, Amy V, PA-C  ipratropium (ATROVENT) 0.06 % nasal spray Place 2 sprays into both nostrils 4 (four) times daily. 09/03/19  Yes Yu, Amy V, PA-C  azithromycin (ZITHROMAX) 250 MG tablet Take 1 tablet (250 mg total) by mouth daily. Take first 2 tablets together, then 1 every day until finished. 09/10/19   Hall-Potvin, [place], PA-C  guaiFENesin-dextromethorphan (ROBITUSSIN DM) 100-10 MG/5ML syrup Take 5 mLs by mouth every 4 (four) hours as needed for cough. 09/10/19   Hall-Potvin, [place], PA-C  loratadine (CLARITIN) 10 MG tablet Take 1 tablet (10 mg total) by mouth daily. 09/10/19   Hall-Potvin, [place], PA-C  predniSONE (DELTASONE) 20 MG tablet Take 1 tablet (20 mg total) by mouth daily for 7 days. 09/10/19 09/17/19  Hall-Potvin, [place], PA-C  Spacer/Aero-Holding Chambers (AEROCHAMBER PLUS FLO-VU MEDIUM) MISC 1 each by Other route once for 1 dose. 09/10/19 09/10/19  Hall-Potvin, [place], PA-C    Family History Family History  Problem Relation Age of Onset  . Diabetes Mother   . Hypertension Mother   . Stroke Mother   . Heart disease  Mother   . Hypertension Father     Social History Social History   Tobacco Use  . Smoking status: Never Smoker  . Smokeless tobacco: Never Used  Substance Use Topics  . Alcohol use: Yes    Alcohol/week: 0.0 standard drinks    Comment: rarely  . Drug use: No     Allergies   Patient has no known allergies.   Review of Systems Review of Systems  Constitutional: Positive for fatigue. Negative for fever.  HENT: Negative for congestion, dental problem, ear pain, facial swelling, hearing loss, sinus pain, sore throat, trouble swallowing and voice change.   Eyes: Negative for photophobia, pain and visual disturbance.  Respiratory: Positive for cough, chest tightness and shortness of breath. Negative for wheezing and stridor.   Cardiovascular: Negative for chest pain, palpitations and leg swelling.    Gastrointestinal: Negative for abdominal pain, diarrhea, nausea and vomiting.  Musculoskeletal: Negative for arthralgias and myalgias.  Neurological: Negative for dizziness and headaches.     Physical Exam Triage Vital Signs ED Triage Vitals  Enc Vitals Group     BP      Pulse      Resp      Temp      Temp src      SpO2      Weight      Height      Head Circumference      Peak Flow      Pain Score      Pain Loc      Pain Edu?      Excl. in [place]?    No data found.  Updated Vital Signs BP (!) 150/88 (BP Location: Left Arm)   Pulse 74   Temp (!) 97.4 F (36.3 C) (Temporal)   Resp 18   SpO2 97%   Visual Acuity Right Eye Distance:   Left Eye Distance:   Bilateral Distance:    Right Eye Near:   Left Eye Near:    Bilateral Near:     Physical Exam Vitals reviewed.  Constitutional:      General: She is not in acute distress.    Appearance: She is normal weight. She is not toxic-appearing or diaphoretic.  HENT:     Head: Normocephalic and atraumatic.     Mouth/Throat:     Mouth: Mucous membranes are moist.     Pharynx: Oropharynx is clear.  Eyes:     General: No scleral icterus.       Right eye: No discharge.        Left eye: No discharge.     Extraocular Movements: Extraocular movements intact.     Pupils: Pupils are equal, round, and reactive to light.  Cardiovascular:     Rate and Rhythm: Normal rate and regular rhythm.     Pulses: Normal pulses.     Heart sounds: No murmur.  Pulmonary:     Effort: Pulmonary effort is normal. No respiratory distress.     Breath sounds: Rhonchi present. No wheezing or rales.     Comments: Scattered rhonchi that improves with cough.  Good air entry bilaterally without prolonged expiratory phase Chest:     Chest wall: No tenderness.  Abdominal:     General: Abdomen is flat.     Palpations: Abdomen is soft.     Tenderness: There is no abdominal tenderness. There is no guarding.  Musculoskeletal:     Cervical back:  Normal range of motion and neck supple. No  tenderness. No muscular tenderness.  Lymphadenopathy:     Cervical: No cervical adenopathy.  Skin:    General: Skin is warm.     Capillary Refill: Capillary refill takes less than 2 seconds.     Coloration: Skin is not jaundiced or pale.     Findings: No rash.  Neurological:     General: No focal deficit present.     Mental Status: She is alert and oriented to person, place, and time.  Psychiatric:        Mood and Affect: Mood normal.        Thought Content: Thought content normal.      UC Treatments / Results  Labs (all labs ordered are listed, but only abnormal results are displayed) Labs Reviewed - No data to display  EKG   Radiology No results found.  Procedures Procedures (including critical care time)  Medications Ordered in UC Medications - No data to display  Initial Impression / Assessment and Plan / UC Course  I have reviewed the triage vital signs and the nursing notes.  Pertinent labs & imaging results that were available during my care of the patient were reviewed by me and considered in my medical decision making (see chart for details).     Patient afebrile, nontoxic in office today.  No acute respiratory distress or chest pain.  Patient's cardiopulmonary exam reassuring at this time: Low concern for pneumonia.  We will add AeroChamber spacing device for improved albuterol administration.  Add additional medications as outlined below for cough.  Patient to follow-up with PCP in 1 to 2 weeks for further evaluation/management of persistent Covid symptoms if needed.  Return precautions discussed, patient verbalized understanding and is agreeable to plan. Final Clinical Impressions(s) / UC Diagnoses   Final diagnoses:  [id number] virus infection  Cough     Discharge Instructions     Use spacing device with inhaler. Take medication as prescribed (read bottles!) Go to ER for worsening breathing, or you develop  chest pain, pass out, or blood/black in stool.    ED Prescriptions    Medication Sig Dispense Auth. Provider   Spacer/Aero-Holding Chambers (AEROCHAMBER PLUS FLO-VU MEDIUM) MISC 1 each by Other route once for 1 dose. 1 each Hall-Potvin, [place], PA-C   predniSONE (DELTASONE) 20 MG tablet Take 1 tablet (20 mg total) by mouth daily for 7 days. 7 tablet Hall-Potvin, [place], PA-C   loratadine (CLARITIN) 10 MG tablet Take 1 tablet (10 mg total) by mouth daily. 30 tablet Hall-Potvin, [place], PA-C   azithromycin (ZITHROMAX) 250 MG tablet Take 1 tablet (250 mg total) by mouth daily. Take first 2 tablets together, then 1 every day until finished. 6 tablet Hall-Potvin, [place], PA-C   guaiFENesin-dextromethorphan (ROBITUSSIN DM) 100-10 MG/5ML syrup Take 5 mLs by mouth every 4 (four) hours as needed for cough. 118 mL Hall-Potvin, [place], PA-C     PDMP not reviewed this encounter.   Hall-Potvin, [place], [place] 09/10/19 [phone]

## 2019-09-10 NOTE — Discharge Instructions (Addendum)
Use spacing device with inhaler. Take medication as prescribed (read bottles!) Go to ER for worsening breathing, or you develop chest pain, pass out, or blood/black in stool.

## 2020-01-18 ENCOUNTER — Other Ambulatory Visit: Payer: Self-pay

## 2020-01-19 ENCOUNTER — Ambulatory Visit (INDEPENDENT_AMBULATORY_CARE_PROVIDER_SITE_OTHER): Payer: BC Managed Care – PPO | Admitting: Nurse Practitioner

## 2020-01-19 ENCOUNTER — Encounter: Payer: Self-pay | Admitting: Nurse Practitioner

## 2020-01-19 VITALS — BP 122/74 | Ht 64.0 in | Wt 176.0 lb

## 2020-01-19 DIAGNOSIS — Z8349 Family history of other endocrine, nutritional and metabolic diseases: Secondary | ICD-10-CM

## 2020-01-19 DIAGNOSIS — Z13220 Encounter for screening for lipoid disorders: Secondary | ICD-10-CM | POA: Diagnosis not present

## 2020-01-19 DIAGNOSIS — Z90710 Acquired absence of both cervix and uterus: Secondary | ICD-10-CM | POA: Diagnosis not present

## 2020-01-19 DIAGNOSIS — Z01419 Encounter for gynecological examination (general) (routine) without abnormal findings: Secondary | ICD-10-CM

## 2020-01-19 LAB — CBC WITH DIFFERENTIAL/PLATELET
Absolute Monocytes: 577 cells/uL (ref 200–950)
Basophils Absolute: 52 cells/uL (ref 0–200)
Basophils Relative: 1 %
Eosinophils Absolute: 78 cells/uL (ref 15–500)
Eosinophils Relative: 1.5 %
HCT: 40.7 % (ref 35.0–45.0)
Hemoglobin: 13.5 g/dL (ref 11.7–15.5)
Lymphs Abs: 2220 cells/uL (ref 850–3900)
MCH: 29.9 pg (ref 27.0–33.0)
MCHC: 33.2 g/dL (ref 32.0–36.0)
MCV: 90.2 fL (ref 80.0–100.0)
MPV: 11.3 fL (ref 7.5–12.5)
Monocytes Relative: 11.1 %
Neutro Abs: 2272 cells/uL (ref 1500–7800)
Neutrophils Relative %: 43.7 %
Platelets: 246 10*3/uL (ref 140–400)
RBC: 4.51 10*6/uL (ref 3.80–5.10)
RDW: 12.7 % (ref 11.0–15.0)
Total Lymphocyte: 42.7 %
WBC: 5.2 10*3/uL (ref 3.8–10.8)

## 2020-01-19 LAB — COMPREHENSIVE METABOLIC PANEL
AG Ratio: 1.6 (calc) (ref 1.0–2.5)
ALT: 12 U/L (ref 6–29)
AST: 17 U/L (ref 10–35)
Albumin: 4.2 g/dL (ref 3.6–5.1)
Alkaline phosphatase (APISO): 64 U/L (ref 37–153)
BUN: 13 mg/dL (ref 7–25)
CO2: 27 mmol/L (ref 20–32)
Calcium: 9.5 mg/dL (ref 8.6–10.4)
Chloride: 104 mmol/L (ref 98–110)
Creat: 0.93 mg/dL (ref 0.50–0.99)
Globulin: 2.6 g/dL (calc) (ref 1.9–3.7)
Glucose, Bld: 81 mg/dL (ref 65–99)
Potassium: 4.2 mmol/L (ref 3.5–5.3)
Sodium: 139 mmol/L (ref 135–146)
Total Bilirubin: 0.6 mg/dL (ref 0.2–1.2)
Total Protein: 6.8 g/dL (ref 6.1–8.1)

## 2020-01-19 LAB — LIPID PANEL
Cholesterol: 212 mg/dL — ABNORMAL HIGH (ref <200)
HDL: 45 mg/dL — ABNORMAL LOW (ref >=50)
LDL Cholesterol (Calc): 139 mg/dL (calc) — ABNORMAL HIGH
Non-HDL Cholesterol (Calc): 167 mg/dL (calc) — ABNORMAL HIGH (ref <130)
Total CHOL/HDL Ratio: 4.7 (calc) (ref <5.0)
Triglycerides: 151 mg/dL — ABNORMAL HIGH (ref <150)

## 2020-01-19 LAB — TSH: TSH: 2.22 mIU/L (ref 0.40–4.50)

## 2020-01-19 NOTE — Patient Instructions (Addendum)
Schedule Bone density test  Schedule colonscopy 2000 units of Vitamin D with daily multivitamin  Health Maintenance, Female Adopting a healthy lifestyle and getting preventive care are important in promoting health and wellness. Ask your health care provider about:  The right schedule for you to have regular tests and exams.  Things you can do on your own to prevent diseases and keep yourself healthy. What should I know about diet, weight, and exercise? Eat a healthy diet   Eat a diet that includes plenty of vegetables, fruits, low-fat dairy products, and lean protein.  Do not eat a lot of foods that are high in solid fats, added sugars, or sodium. Maintain a healthy weight Body mass index (BMI) is used to identify weight problems. It estimates body fat based on height and weight. Your health care provider can help determine your BMI and help you achieve or maintain a healthy weight. Get regular exercise Get regular exercise. This is one of the most important things you can do for your health. Most adults should:  Exercise for at least 150 minutes each week. The exercise should increase your heart rate and make you sweat (moderate-intensity exercise).  Do strengthening exercises at least twice a week. This is in addition to the moderate-intensity exercise.  Spend less time sitting. Even light physical activity can be beneficial. Watch cholesterol and blood lipids Have your blood tested for lipids and cholesterol at 63 years of age, then have this test every 5 years. Have your cholesterol levels checked more often if:  Your lipid or cholesterol levels are high.  You are older than 63 years of age.  You are at high risk for heart disease. What should I know about cancer screening? Depending on your health history and family history, you may need to have cancer screening at various ages. This may include screening for:  Breast cancer.  Cervical cancer.  Colorectal  cancer.  Skin cancer.  Lung cancer. What should I know about heart disease, diabetes, and high blood pressure? Blood pressure and heart disease  High blood pressure causes heart disease and increases the risk of stroke. This is more likely to develop in people who have high blood pressure readings, are of African descent, or are overweight.  Have your blood pressure checked: ? Every 3-5 years if you are 20-63 years of age. ? Every year if you are 78 years old or older. Diabetes Have regular diabetes screenings. This checks your fasting blood sugar level. Have the screening done:  Once every three years after age 3 if you are at a normal weight and have a low risk for diabetes.  More often and at a younger age if you are overweight or have a high risk for diabetes. What should I know about preventing infection? Hepatitis B If you have a higher risk for hepatitis B, you should be screened for this virus. Talk with your health care provider to find out if you are at risk for hepatitis B infection. Hepatitis C Testing is recommended for:  Everyone born from [birth year] through 1964.  Anyone with known risk factors for hepatitis C. Sexually transmitted infections (STIs)  Get screened for STIs, including gonorrhea and chlamydia, if: ? You are sexually active and are younger than 63 years of age. ? You are older than 63 years of age and your health care provider tells you that you are at risk for this type of infection. ? Your sexual activity has changed since you were last screened,  and you are at increased risk for chlamydia or gonorrhea. Ask your health care provider if you are at risk.  Ask your health care provider about whether you are at high risk for HIV. Your health care provider may recommend a prescription medicine to help prevent HIV infection. If you choose to take medicine to prevent HIV, you should first get tested for HIV. You should then be tested every 3 months for as long as  you are taking the medicine. Pregnancy  If you are about to stop having your period (premenopausal) and you may become pregnant, seek counseling before you get pregnant.  Take 400 to 800 micrograms (mcg) of folic acid every day if you become pregnant.  Ask for birth control (contraception) if you want to prevent pregnancy. Osteoporosis and menopause Osteoporosis is a disease in which the bones lose minerals and strength with aging. This can result in bone fractures. If you are 39 years old or older, or if you are at risk for osteoporosis and fractures, ask your health care provider if you should:  Be screened for bone loss.  Take a calcium or vitamin D supplement to lower your risk of fractures.  Be given hormone replacement therapy (HRT) to treat symptoms of menopause. Follow these instructions at home: Lifestyle  Do not use any products that contain nicotine or tobacco, such as cigarettes, e-cigarettes, and chewing tobacco. If you need help quitting, ask your health care provider.  Do not use street drugs.  Do not share needles.  Ask your health care provider for help if you need support or information about quitting drugs. Alcohol use  Do not drink alcohol if: ? Your health care provider tells you not to drink. ? You are pregnant, may be pregnant, or are planning to become pregnant.  If you drink alcohol: ? Limit how much you use to 0-1 drink a day. ? Limit intake if you are breastfeeding.  Be aware of how much alcohol is in your drink. In the [place] one drink equals one 12 oz bottle of beer (355 mL), one 5 oz glass of wine (148 mL), or one 1 oz glass of hard liquor (44 mL). General instructions  Schedule regular health, dental, and eye exams.  Stay current with your vaccines.  Tell your health care provider if: ? You often feel depressed. ? You have ever been abused or do not feel safe at home. Summary  Adopting a healthy lifestyle and getting preventive care are  important in promoting health and wellness.  Follow your health care provider's instructions about healthy diet, exercising, and getting tested or screened for diseases.  Follow your health care provider's instructions on monitoring your cholesterol and blood pressure. This information is not intended to replace advice given to you by your health care provider. Make sure you discuss any questions you have with your health care provider. Document Revised: 08/13/2018 Document Reviewed: 08/13/2018 Elsevier Patient Education  2020 [name].

## 2020-01-19 NOTE — Progress Notes (Signed)
   Susan Benjamin [birth year] [id number]   History:  64 y.o. MWF G5 P2 presents for annual exam without GYN complaints.  TVH in 2012 for DUB, no HRT.  Last Pap in 2011, opted out.  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  Plans to get Shingrix this summer, recently had her Covid vaccines.  Gynecologic History No LMP recorded. Patient has had a hysterectomy.   Last Pap: 2011. Results were: normal Last mammogram: 03/17/2019. Results were: normal DEXA: 09/24/2013. Results: normal  Colonoscopy: 2009. Results: benign polyp  Past medical history, past surgical history, family history and social history were all reviewed and documented in the EPIC chart.  Retired from [employer] in January after 32 years.  ROS:  A ROS was performed and pertinent positives and negatives are included.  Exam:  Vitals:   01/19/20 0915  BP: 122/74  Weight: 176 lb (79.8 kg)  Height: 5\' 4"  (1.626 m)   Body mass index is 30.21 kg/m.  General appearance:  Normal Thyroid:  Symmetrical, normal in size, without palpable masses or nodularity. Respiratory  Auscultation:  Clear without wheezing or rhonchi Cardiovascular  Auscultation:  Regular rate, without rubs, murmurs or gallops  Edema/varicosities:  Not grossly evident Abdominal  Soft,nontender, without masses, guarding or rebound.  Liver/spleen:  No organomegaly noted  Hernia:  None appreciated  Skin  Inspection:  Grossly normal   Breasts: Examined lying and sitting.   Right: Without masses, retractions, discharge or axillary adenopathy.   Left: Without masses, retractions, discharge or axillary adenopathy. Gentitourinary   Inguinal/mons:  Normal without inguinal adenopathy  External genitalia:  Normal  BUS/Urethra/Skene's glands:  Normal  Vagina:  Normal  Cervix: Absent  Uterus:  Absent  Adnexa/parametria:     Rt: Without masses or tenderness.   Lt: Without masses or tenderness.  Anus and perineum: Normal  Digital rectal  exam: Normal sphincter tone without palpated masses or tenderness  Assessment/Plan:  64 y.o.MWF G5P2  for annual exam.    Well female exam with routine gynecological exam - Plan: CBC with Differential/Platelet, Comprehensive metabolic panel. Education provided on SBEs, importance of preventative screenings, current guidelines, high calcium diet, and regular exercise including weightbearing.  Instructed to begin taking a multivitamin and 2000 units vitamin D daily.  Patient due for mammogram in July, plans to try to schedule bone density for same day.  Recently received reminder for colonoscopy from GI and plans to schedule this soon as well.  Family history of thyroid disease - Plan: TSH  Lipid screening - Plan: Lipid panel  History of total vaginal hysterectomy - 2012  Follow-up in 1 year for annual    [place], 9:25 AM 01/19/2020

## 2020-04-26 ENCOUNTER — Other Ambulatory Visit: Payer: Self-pay | Admitting: Nurse Practitioner

## 2020-04-26 ENCOUNTER — Other Ambulatory Visit: Payer: Self-pay | Admitting: Women's Health

## 2020-04-26 DIAGNOSIS — Z1231 Encounter for screening mammogram for malignant neoplasm of breast: Secondary | ICD-10-CM

## 2020-05-19 ENCOUNTER — Ambulatory Visit
Admission: RE | Admit: 2020-05-19 | Discharge: 2020-05-19 | Disposition: A | Discharge status: Home / Self Care | Payer: BC Managed Care – PPO | Source: Ambulatory Visit | Attending: Nurse Practitioner | Admitting: Nurse Practitioner

## 2020-05-19 ENCOUNTER — Other Ambulatory Visit: Payer: Self-pay | Admitting: Nurse Practitioner

## 2020-05-19 ENCOUNTER — Other Ambulatory Visit: Payer: Self-pay

## 2020-05-19 DIAGNOSIS — Z1231 Encounter for screening mammogram for malignant neoplasm of breast: Secondary | ICD-10-CM

## 2021-01-19 ENCOUNTER — Ambulatory Visit: Payer: BC Managed Care – PPO | Admitting: Nurse Practitioner

## 2021-02-06 ENCOUNTER — Ambulatory Visit (INDEPENDENT_AMBULATORY_CARE_PROVIDER_SITE_OTHER): Payer: BC Managed Care – PPO | Admitting: Nurse Practitioner

## 2021-02-06 ENCOUNTER — Encounter: Payer: Self-pay | Admitting: Nurse Practitioner

## 2021-02-06 ENCOUNTER — Other Ambulatory Visit: Payer: Self-pay

## 2021-02-06 VITALS — BP 122/74 | Ht 64.0 in | Wt 171.0 lb

## 2021-02-06 DIAGNOSIS — E785 Hyperlipidemia, unspecified: Secondary | ICD-10-CM

## 2021-02-06 DIAGNOSIS — Z01419 Encounter for gynecological examination (general) (routine) without abnormal findings: Secondary | ICD-10-CM

## 2021-02-06 DIAGNOSIS — Z780 Asymptomatic menopausal state: Secondary | ICD-10-CM

## 2021-02-06 NOTE — Progress Notes (Signed)
   Susan Benjamin [facility] 31-Mar-1956 [id number]   History:  65 y.o. [id number] presents for annual exam without GYN complaints. Postmenopausal - no HRT. 2012 TVH for DUB. 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  Normal mammogram history.  Gynecologic History No LMP recorded. Patient has had a hysterectomy.   Contraception/Family planning: status post hysterectomy  Health Maintenance Last Pap:  No longer screening per guidelines Last mammogram: 05/19/2020. Results were: normal Last colonoscopy: 2021. Results were: normal, 10-year recall Last Dexa: 2015. Results were: nromal  Past medical history, past surgical history, family history and social history were all reviewed and documented in the EPIC chart. Married. Retired from [employer]. Daughter - PACU nurse at [employer], has 17 yo son. Son has 3 children.   ROS:  A ROS was performed and pertinent positives and negatives are included.  Exam:  Vitals:   02/06/21 0909  BP: 122/74  Weight: 171 lb (77.6 kg)  Height: 5\' 4"  (1.626 m)   Body mass index is 29.35 kg/m.  General appearance:  Normal Thyroid:  Symmetrical, normal in size, without palpable masses or nodularity. Respiratory  Auscultation:  Clear without wheezing or rhonchi Cardiovascular  Auscultation:  Regular rate, without rubs, murmurs or gallops  Edema/varicosities:  Not grossly evident Abdominal  Soft,nontender, without masses, guarding or rebound.  Liver/spleen:  No organomegaly noted  Hernia:  None appreciated  Skin  Inspection:  Grossly normal Breasts: Examined lying and sitting.   Right: Without masses, retractions, nipple discharge or axillary adenopathy.   Left: Without masses, retractions, nipple discharge or axillary adenopathy. Genitourinary   Inguinal/mons:  Normal without inguinal adenopathy  External genitalia:  Normal appearing vulva with no masses, tenderness, or lesions  BUS/Urethra/Skene's glands:  Normal  Vagina:  Normal appearing with normal color  and discharge, no lesions. Mild atrophic changes  Cervix:  Absent  Uterus:  Absent  Adnexa/parametria:     Rt: Normal in size, without masses or tenderness.   Lt: Normal in size, without masses or tenderness.  Anus and perineum: Normal  Digital rectal exam: Normal sphincter tone without palpated masses or tenderness  Assessment/Plan:  65 y.o. [id number] for annual exam.   Well female exam with routine gynecological exam - Plan: CBC with Differential/Platelet, Comprehensive metabolic panel. Education provided on SBEs, importance of preventative screenings, current guidelines, high calcium diet, regular exercise, and multivitamin daily.   Postmenopausal - no HRT  Hyperlipidemia, unspecified hyperlipidemia type - Plan: Lipid panel  Screening for cervical cancer - 2003 LEEP CIN-1, subsequent paps normal. No longer screening per guidelines.   Screening for breast cancer - Normal mammogram history.  Continue annual screenings.  Normal breast exam today.  Screening for colon cancer - 2021 colonoscopy. Will repeat at GI's recommended interval.   Return in 1 year for annual.    [name] DNP, 9:19 AM 02/06/2021

## 2021-02-06 NOTE — Patient Instructions (Signed)

## 2021-02-07 LAB — COMPREHENSIVE METABOLIC PANEL
AG Ratio: 1.8 (calc) (ref 1.0–2.5)
ALT: 13 U/L (ref 6–29)
AST: 17 U/L (ref 10–35)
Albumin: 4.6 g/dL (ref 3.6–5.1)
Alkaline phosphatase (APISO): 70 U/L (ref 37–153)
BUN: 20 mg/dL (ref 7–25)
CO2: 26 mmol/L (ref 20–32)
Calcium: 9.4 mg/dL (ref 8.6–10.4)
Chloride: 105 mmol/L (ref 98–110)
Creat: 0.8 mg/dL (ref 0.50–0.99)
Globulin: 2.5 g/dL (calc) (ref 1.9–3.7)
Glucose, Bld: 95 mg/dL (ref 65–99)
Potassium: 4.4 mmol/L (ref 3.5–5.3)
Sodium: 141 mmol/L (ref 135–146)
Total Bilirubin: 0.4 mg/dL (ref 0.2–1.2)
Total Protein: 7.1 g/dL (ref 6.1–8.1)

## 2021-02-07 LAB — CBC WITH DIFFERENTIAL/PLATELET
Absolute Monocytes: 477 cells/uL (ref 200–950)
Basophils Absolute: 32 cells/uL (ref 0–200)
Basophils Relative: 0.6 %
Eosinophils Absolute: 122 cells/uL (ref 15–500)
Eosinophils Relative: 2.3 %
HCT: 41.3 % (ref 35.0–45.0)
Hemoglobin: 13.6 g/dL (ref 11.7–15.5)
Lymphs Abs: 1813 cells/uL (ref 850–3900)
MCH: 29.5 pg (ref 27.0–33.0)
MCHC: 32.9 g/dL (ref 32.0–36.0)
MCV: 89.6 fL (ref 80.0–100.0)
MPV: 11.2 fL (ref 7.5–12.5)
Monocytes Relative: 9 %
Neutro Abs: 2857 cells/uL (ref 1500–7800)
Neutrophils Relative %: 53.9 %
Platelets: 276 10*3/uL (ref 140–400)
RBC: 4.61 10*6/uL (ref 3.80–5.10)
RDW: 12.5 % (ref 11.0–15.0)
Total Lymphocyte: 34.2 %
WBC: 5.3 10*3/uL (ref 3.8–10.8)

## 2021-02-07 LAB — LIPID PANEL
Cholesterol: 216 mg/dL — ABNORMAL HIGH (ref <200)
HDL: 47 mg/dL — ABNORMAL LOW (ref >=50)
LDL Cholesterol (Calc): 143 mg/dL (calc) — ABNORMAL HIGH
Non-HDL Cholesterol (Calc): 169 mg/dL (calc) — ABNORMAL HIGH (ref <130)
Total CHOL/HDL Ratio: 4.6 (calc) (ref <5.0)
Triglycerides: 134 mg/dL (ref <150)

## 2021-07-03 ENCOUNTER — Other Ambulatory Visit: Payer: Self-pay | Admitting: Nurse Practitioner

## 2021-07-03 DIAGNOSIS — Z1231 Encounter for screening mammogram for malignant neoplasm of breast: Secondary | ICD-10-CM

## 2021-08-08 ENCOUNTER — Ambulatory Visit
Admission: RE | Admit: 2021-08-08 | Discharge: 2021-08-08 | Disposition: A | Discharge status: Home / Self Care | Payer: BC Managed Care – PPO | Source: Ambulatory Visit | Attending: Nurse Practitioner | Admitting: Nurse Practitioner

## 2021-08-08 DIAGNOSIS — Z1231 Encounter for screening mammogram for malignant neoplasm of breast: Secondary | ICD-10-CM

## 2021-08-10 ENCOUNTER — Other Ambulatory Visit: Payer: Self-pay | Admitting: Nurse Practitioner

## 2021-08-10 DIAGNOSIS — R928 Other abnormal and inconclusive findings on diagnostic imaging of breast: Secondary | ICD-10-CM

## 2021-09-13 ENCOUNTER — Ambulatory Visit
Admission: RE | Admit: 2021-09-13 | Discharge: 2021-09-13 | Disposition: A | Discharge status: Home / Self Care | Payer: BC Managed Care – PPO | Source: Ambulatory Visit | Attending: Nurse Practitioner | Admitting: Nurse Practitioner

## 2021-09-13 ENCOUNTER — Other Ambulatory Visit: Payer: Self-pay | Admitting: Nurse Practitioner

## 2021-09-13 DIAGNOSIS — R928 Other abnormal and inconclusive findings on diagnostic imaging of breast: Secondary | ICD-10-CM

## 2021-09-19 ENCOUNTER — Ambulatory Visit
Admission: RE | Admit: 2021-09-19 | Discharge: 2021-09-19 | Disposition: A | Discharge status: Home / Self Care | Payer: BC Managed Care – PPO | Source: Ambulatory Visit | Attending: Nurse Practitioner | Admitting: Nurse Practitioner

## 2021-09-19 ENCOUNTER — Other Ambulatory Visit: Payer: BC Managed Care – PPO

## 2021-09-19 ENCOUNTER — Other Ambulatory Visit: Payer: Self-pay

## 2021-09-19 DIAGNOSIS — R928 Other abnormal and inconclusive findings on diagnostic imaging of breast: Secondary | ICD-10-CM

## 2021-09-25 ENCOUNTER — Other Ambulatory Visit: Payer: Self-pay | Admitting: *Deleted

## 2021-09-25 ENCOUNTER — Telehealth: Payer: Self-pay | Admitting: Hematology and Oncology

## 2021-09-25 ENCOUNTER — Ambulatory Visit: Payer: Self-pay | Admitting: Surgery

## 2021-09-25 ENCOUNTER — Other Ambulatory Visit: Payer: Self-pay | Admitting: Surgery

## 2021-09-25 DIAGNOSIS — C50411 Malignant neoplasm of upper-outer quadrant of right female breast: Secondary | ICD-10-CM

## 2021-09-25 DIAGNOSIS — C50911 Malignant neoplasm of unspecified site of right female breast: Secondary | ICD-10-CM

## 2021-09-25 DIAGNOSIS — Z170 Estrogen receptor positive status [ER+]: Secondary | ICD-10-CM

## 2021-09-25 NOTE — Telephone Encounter (Signed)
Scheduled appt per 1/23 referral. Spoke to pt who is aware of appt date and time. Pt is aware to arrive 15 mins prior to appt time.

## 2021-09-25 NOTE — H&P (View-Only) (Signed)
Subjective    Chief Complaint: Breast Cancer       History of Present Illness: Susan Benjamin is a 66 y.o. female who is seen today as an office consultation at the request of Dr. Juleen China for evaluation of Breast Cancer .     This is a 66 year old female in excellent health who presents with recent finding of a right breast mass.  This was found on a routine screening mammogram.  Her previous mammogram was normal.  Further work-up including ultrasound showing a 1.6 x 1.9 x 1.2 cm mass located at 12:00, 2 cm from the nipple.  The axilla was negative.  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  The patient has developed a small hematoma in the area of her biopsy.   No family history of breast cancer.  No previous breast biopsies.  Her daughter is a Marine scientist who works in the emergency department at [employer].     Review of Systems: A complete review of systems was obtained from the patient.  I have reviewed this information and discussed as appropriate with the patient.  See HPI as well for other ROS.   Review of Systems  Constitutional: Negative.   HENT: Negative.   Eyes: Negative.   Respiratory: Negative.   Cardiovascular: Negative.   Gastrointestinal: Negative.   Genitourinary: Negative.   Musculoskeletal: Negative.   Skin: Negative.   Neurological: Negative.   Endo/Heme/Allergies: Negative.   Psychiatric/Behavioral: Negative.         Medical History: History reviewed. No pertinent past medical history.      Patient Active Problem List  Diagnosis   Invasive ductal carcinoma of breast, female, right (CMS-HCC)           Past Surgical History:  Procedure Laterality Date   HYSTERECTOMY          No Known Allergies         Current Outpatient Medications on File Prior to Visit  Medication Sig Dispense Refill   ibuprofen (MOTRIN) 200 MG tablet Take 200 mg by mouth every 6 (six) hours as needed  for Pain        No current facility-administered medications on file prior to visit.           Family History  Problem Relation Age of Onset   Stroke Mother     Obesity Mother     High blood pressure (Hypertension) Mother     Hyperlipidemia (Elevated cholesterol) Mother     Myocardial Infarction (Heart attack) Mother     Skin cancer Father        Social History       Tobacco Use  Smoking Status Never  Smokeless Tobacco Never      Social History        Socioeconomic History   Marital status: Married  Tobacco Use   Smoking status: Never   Smokeless tobacco: Never  Substance and Sexual Activity   Alcohol use: Never   Drug use: Never      Objective:         Vitals:    09/25/21 0843  BP: (!) 148/98  Pulse: 76  Weight: 75 kg (165 lb 6.4 oz)  Height: 162.6 cm (_0 )    Body mass index is 28.39 kg/m.   Physical Exam    Constitutional:  WDWN in NAD, conversant, no obvious deformities; lying in bed comfortably Eyes:  Pupils equal, round; sclera  anicteric; moist conjunctiva; no lid lag HENT:  Oral mucosa moist; good dentition  Neck:  No masses palpated, trachea midline; no thyromegaly Lungs:  CTA bilaterally; normal respiratory effort Breasts:  symmetric, no nipple changes; no palpable left breast masses; bruising and firm 2 cm mass behind upper areola; no lymphadenopathy on either side CV:  Regular rate and rhythm; no murmurs; extremities well-perfused with no edema Abd:  +bowel sounds, soft, non-tender, no palpable organomegaly; no palpable hernias Musc:  Unable to assess gait; no apparent clubbing or cyanosis in extremities Lymphatic:  No palpable cervical or axillary lymphadenopathy Skin:  Warm, dry; no sign of jaundice Psychiatric - alert and oriented x 4; calm mood and affect     Labs, Imaging and Diagnostic Testing: CLINICAL DATA:  Screening.   EXAM: DIGITAL SCREENING BILATERAL MAMMOGRAM WITH TOMOSYNTHESIS AND CAD   TECHNIQUE: Bilateral screening  digital craniocaudal and mediolateral oblique mammograms were obtained. Bilateral screening digital breast tomosynthesis was performed. The images were evaluated with computer-aided detection.   COMPARISON:  Previous exam(s).   ACR Breast Density Category c: The breast tissue is heterogeneously dense, which may obscure small masses.   FINDINGS: In the right breast, a 2 possible masses warrant further evaluation. In the left breast, no findings suspicious for malignancy.   IMPRESSION: Further evaluation is suggested for 2 possible masses in the right breast.   RECOMMENDATION: Diagnostic mammogram and possibly ultrasound of the right breast. (Code:FI-R-65M)   The patient will be contacted regarding the findings, and additional imaging will be scheduled.   BI-RADS CATEGORY  0: Incomplete. Need additional imaging evaluation and/or prior mammograms for comparison.     Electronically Signed   By: Claudie Revering M.D.   On: 08/09/2021 13:33   CLINICAL DATA:  Recall for 2 possible masses in the right breast.   EXAM: DIGITAL DIAGNOSTIC UNILATERAL RIGHT MAMMOGRAM WITH TOMOSYNTHESIS AND CAD; ULTRASOUND RIGHT BREAST LIMITED   TECHNIQUE: Right digital diagnostic mammography and breast tomosynthesis was performed. The images were evaluated with computer-aided detection.; Targeted ultrasound examination of the right breast was performed   COMPARISON:  Previous exam(s).   ACR Breast Density Category c: The breast tissue is heterogeneously dense, which may obscure small masses.   FINDINGS: The previously noted possible mass in the upper central breast at middle depth persists on additional views as a 1.9 cm oval mass with indistinct margins. The second previously noted possible mass in the upper outer breast at anterior to middle depth dissipates on additional views, consistent with overlapping fibroglandular tissue.   Targeted ultrasound of the right breast at the 12 o'clock  position 2 cm from the nipple demonstrates a 1.6 x 1.2 x 1.9 cm irregular hypoechoic mass with indistinct and angular margins, corresponding to the mammographic finding.   Targeted ultrasound of the right axilla demonstrates lymph nodes with normal morphology.   IMPRESSION: 1. Suspicious 1.9 cm mass at the right breast 12 o'clock position for which ultrasound-guided biopsy is recommended. 2. No right axillary lymphadenopathy.   RECOMMENDATION: Right breast ultrasound-guided biopsy, scheduled for 09/19/2021.   I have discussed the findings and recommendations with the patient. If applicable, a reminder letter will be sent to the patient regarding the next appointment.   BI-RADS CATEGORY  4: Suspicious.     Electronically Signed   By: Ileana Roup M.D.   On: 09/13/2021 12:32     Assessment and Plan:  Diagnoses and all orders for this visit:   Invasive ductal carcinoma of breast, female, right (CMS-HCC) -  Ambulatory Referral to Oncology-Medical -     Ambulatory Referral to Radiation Oncology   I had a long discussion with the patient and her husband as well as her daughter.  We discussed her diagnosis.  We discussed the different types of breast cancer and specifically discussed her pathology findings.  We discussed the expected course of treatment.  We discussed the options of mastectomy versus breast conserving therapy.  I explained to her the need for team approach with oncology and radiation oncology.  All of their questions were answered.   She has decided to proceed with breast conserving therapy.  Recommend right radioactive seed localized lumpectomy with axillary sentinel lymph node biopsy.  We will use both Lymphoseek and mag trace.The surgical procedure has been discussed with the patient.  Potential risks, benefits, alternative treatments, and expected outcomes have been explained.  All of the patient's questions at this time have been answered.  The likelihood of  reaching the patient's treatment goal is good.  The patient understand the proposed surgical procedure and wishes to proceed.   Carlean Jews, MD  09/25/2021 9:37 AM

## 2021-09-25 NOTE — H&P (Signed)
Subjective  °  °Chief Complaint: Breast Cancer °  °  °  °History of Present Illness: °Susan Benjamin is a 65 y.o. female who is seen today as an office consultation at the request of Dr. Wallace for evaluation of Breast Cancer °.   °  °This is a 65-year-old female in excellent health who presents with recent finding of a right breast mass.  This was found on a routine screening mammogram.  Her previous mammogram was normal.  Further work-up including ultrasound showing a 1.6 x 1.9 x 1.2 cm mass located at 12:00, 2 cm from the nipple.  The axilla was negative.  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  The patient has developed a small hematoma in the area of her biopsy. °  °No family history of breast cancer.  No previous breast biopsies.  Her daughter is a nurse who works in the emergency department at [employer]. °  °  °Review of Systems: °A complete review of systems was obtained from the patient.  I have reviewed this information and discussed as appropriate with the patient.  See HPI as well for other ROS. °  °Review of Systems  °Constitutional: Negative.   °HENT: Negative.   °Eyes: Negative.   °Respiratory: Negative.   °Cardiovascular: Negative.   °Gastrointestinal: Negative.   °Genitourinary: Negative.   °Musculoskeletal: Negative.   °Skin: Negative.   °Neurological: Negative.   °Endo/Heme/Allergies: Negative.   °Psychiatric/Behavioral: Negative.   °  °  °  °Medical History: °History reviewed. No pertinent past medical history. °  °   °Patient Active Problem List  °Diagnosis  ° Invasive ductal carcinoma of breast, female, right (CMS-HCC)  °  °  °     °Past Surgical History:  °Procedure Laterality Date  ° HYSTERECTOMY      °  °  °No Known Allergies °  °      °Current Outpatient Medications on File Prior to Visit  °Medication Sig Dispense Refill  ° ibuprofen (MOTRIN) 200 MG tablet Take 200 mg by mouth every 6 (six) hours as needed  for Pain      °  °No current facility-administered medications on file prior to visit.  °  °  °     °Family History  °Problem Relation Age of Onset  ° Stroke Mother    ° Obesity Mother    ° High blood pressure (Hypertension) Mother    ° Hyperlipidemia (Elevated cholesterol) Mother    ° Myocardial Infarction (Heart attack) Mother    ° Skin cancer Father    °  °  °Social History  °  °   °Tobacco Use  °Smoking Status Never  °Smokeless Tobacco Never  °  °  °Social History  °  °    °Socioeconomic History  ° Marital status: Married  °Tobacco Use  ° Smoking status: Never  ° Smokeless tobacco: Never  °Substance and Sexual Activity  ° Alcohol use: Never  ° Drug use: Never  °  °  °Objective:  °  °  °   °Vitals:  °  09/25/21 0843  °BP: (!) 148/98  °Pulse: 76  °Weight: 75 kg (165 lb 6.4 oz)  °Height: 162.6 cm (5' 4")  °  °Body mass index is 28.39 kg/m². °  °Physical Exam  °  °Constitutional:  WDWN in NAD, conversant, no obvious deformities; lying in bed comfortably °Eyes:  Pupils equal, round; sclera   anicteric; moist conjunctiva; no lid lag °HENT:  Oral mucosa moist; good dentition  °Neck:  No masses palpated, trachea midline; no thyromegaly °Lungs:  CTA bilaterally; normal respiratory effort °Breasts:  symmetric, no nipple changes; no palpable left breast masses; bruising and firm 2 cm mass behind upper areola; no lymphadenopathy on either side °CV:  Regular rate and rhythm; no murmurs; extremities well-perfused with no edema °Abd:  +bowel sounds, soft, non-tender, no palpable organomegaly; no palpable hernias °Musc:  Unable to assess gait; no apparent clubbing or cyanosis in extremities °Lymphatic:  No palpable cervical or axillary lymphadenopathy °Skin:  Warm, dry; no sign of jaundice °Psychiatric - alert and oriented x 4; calm mood and affect °  °  °Labs, Imaging and Diagnostic Testing: °CLINICAL DATA:  Screening. °  °EXAM: °DIGITAL SCREENING BILATERAL MAMMOGRAM WITH TOMOSYNTHESIS AND CAD °  °TECHNIQUE: °Bilateral screening  digital craniocaudal and mediolateral oblique °mammograms were obtained. Bilateral screening digital breast °tomosynthesis was performed. The images were evaluated with °computer-aided detection. °  °COMPARISON:  Previous exam(s). °  °ACR Breast Density Category c: The breast tissue is heterogeneously °dense, which may obscure small masses. °  °FINDINGS: °In the right breast, a 2 possible masses warrant further evaluation. °In the left breast, no findings suspicious for malignancy. °  °IMPRESSION: °Further evaluation is suggested for 2 possible masses in the right °breast. °  °RECOMMENDATION: °Diagnostic mammogram and possibly ultrasound of the right breast. °(Code:[id number]-00M) °  °The patient will be contacted regarding the findings, and additional °imaging will be scheduled. °  °BI-RADS CATEGORY  0: Incomplete. Need additional imaging evaluation °and/or prior mammograms for comparison. °  °  °Electronically Signed °  By: Steven  Reid M.D. °  On: 08/09/2021 13:33 °  °CLINICAL DATA:  Recall for 2 possible masses in the right breast. °  °EXAM: °DIGITAL DIAGNOSTIC UNILATERAL RIGHT MAMMOGRAM WITH TOMOSYNTHESIS AND °CAD; ULTRASOUND RIGHT BREAST LIMITED °  °TECHNIQUE: °Right digital diagnostic mammography and breast tomosynthesis was °performed. The images were evaluated with computer-aided detection.; °Targeted ultrasound examination of the right breast was performed °  °COMPARISON:  Previous exam(s). °  °ACR Breast Density Category c: The breast tissue is heterogeneously °dense, which may obscure small masses. °  °FINDINGS: °The previously noted possible mass in the upper central breast at °middle depth persists on additional views as a 1.9 cm oval mass with °indistinct margins. The second previously noted possible mass in the °upper outer breast at anterior to middle depth dissipates on °additional views, consistent with overlapping fibroglandular tissue. °  °Targeted ultrasound of the right breast at the 12 o'clock  position 2 °cm from the nipple demonstrates a 1.6 x 1.2 x 1.9 cm irregular °hypoechoic mass with indistinct and angular margins, corresponding °to the mammographic finding. °  °Targeted ultrasound of the right axilla demonstrates lymph nodes °with normal morphology. °  °IMPRESSION: °1. Suspicious 1.9 cm mass at the right breast 12 o'clock position °for which ultrasound-guided biopsy is recommended. °2. No right axillary lymphadenopathy. °  °RECOMMENDATION: °Right breast ultrasound-guided biopsy, scheduled for 09/19/2021. °  °I have discussed the findings and recommendations with the patient. °If applicable, a reminder letter will be sent to the patient °regarding the next appointment. °  °BI-RADS CATEGORY  4: Suspicious. °  °  °Electronically Signed °  By: Laura  Parra M.D. °  On: 09/13/2021 12:32 °  °  °Assessment and Plan:  °Diagnoses and all orders for this visit: °  °Invasive ductal carcinoma of breast, female, right (CMS-HCC) °-       Ambulatory Referral to Oncology-Medical °-     Ambulatory Referral to Radiation Oncology °  °I had a long discussion with the patient and her husband as well as her daughter.  We discussed her diagnosis.  We discussed the different types of breast cancer and specifically discussed her pathology findings.  We discussed the expected course of treatment.  We discussed the options of mastectomy versus breast conserving therapy.  I explained to her the need for team approach with oncology and radiation oncology.  All of their questions were answered. °  °She has decided to proceed with breast conserving therapy.  Recommend right radioactive seed localized lumpectomy with axillary sentinel lymph node biopsy.  We will use both Lymphoseek and mag trace.The surgical procedure has been discussed with the patient.  Potential risks, benefits, alternative treatments, and expected outcomes have been explained.  All of the patient's questions at this time have been answered.  The likelihood of  reaching the patient's treatment goal is good.  The patient understand the proposed surgical procedure and wishes to proceed. °  °Brittne Kawasaki KAI Prisilla Kocsis, MD  °09/25/2021 °9:37 AM °  ° °

## 2021-09-26 ENCOUNTER — Ambulatory Visit: Payer: BC Managed Care – PPO | Admitting: Rehabilitation

## 2021-09-26 NOTE — Progress Notes (Signed)
New Breast Cancer Diagnosis: Right Breast  Did patient present with symptoms (if so, please note symptoms) or screening mammography?:Screening Mass    Location and Extent of disease :right breast. Located at 12  o'clock position, measured  1.6 x 1.9 x 1.2 cm in greatest dimension. Adenopathy no.  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 Georgette Dover -Right Breast Lumpectomy with radioactive seed and SLN biopsy 10/06/2021    Medical oncologist, treatment if any:   Dr. Chryl Heck 09/27/2021 11 am -Prognostic showed ER weakly +30%, PR negative, HER2 negative and Ki-67 of 90%.  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 -I have discussed adjuvant chemotherapy for this patient given functional triple negative breast cancer.  We have discussed about docetaxel and cyclophosphamide. -We have discussed about port which may be convenient to the patient to prevent multiple blood draws from her arms.  She is willing to proceed with port placement.   -She understands that if she does have positive lymph nodes on final pathology, we may recommend a different type of chemotherapy.   -Given functional triple negative tumor, I believe she will be a good candidate for genetic testing. -Post chemotherapy, she will undergo adjuvant radiation.  Post adjuvant radiation, she will proceed with antiestrogen therapy.  We have briefly discussed about role of immunotherapy and triple negative breast cancer especially when we started neoadjuvant where we tend to continue it in the adjuvant setting.  In her case since we are proceeding with adjuvant chemotherapy, I do not know if there is a definitive role of immunotherapy.   Family History of Breast/Ovarian/Prostate Cancer: No  Lymphedema issues, if any: No      Pain issues, if any: No    SAFETY ISSUES: Prior radiation? No Pacemaker/ICD? No Possible current pregnancy? Hysterectomy Is the patient on methotrexate? No  Current Complaints / other details:   -Physical Therapy: Jana Half 09/27/2021

## 2021-09-27 ENCOUNTER — Encounter: Payer: Self-pay | Admitting: Hematology and Oncology

## 2021-09-27 ENCOUNTER — Other Ambulatory Visit: Payer: Self-pay

## 2021-09-27 ENCOUNTER — Inpatient Hospital Stay (HOSPITAL_BASED_OUTPATIENT_CLINIC_OR_DEPARTMENT_OTHER): Payer: BC Managed Care – PPO | Admitting: Genetic Counselor

## 2021-09-27 ENCOUNTER — Ambulatory Visit
Admission: RE | Admit: 2021-09-27 | Discharge: 2021-09-27 | Disposition: A | Discharge status: Home / Self Care | Payer: BC Managed Care – PPO | Source: Ambulatory Visit | Attending: Radiation Oncology | Admitting: Radiation Oncology

## 2021-09-27 ENCOUNTER — Other Ambulatory Visit: Payer: Self-pay | Admitting: *Deleted

## 2021-09-27 ENCOUNTER — Inpatient Hospital Stay: Payer: BC Managed Care – PPO

## 2021-09-27 ENCOUNTER — Inpatient Hospital Stay: Payer: BC Managed Care – PPO | Attending: Hematology and Oncology | Admitting: Hematology and Oncology

## 2021-09-27 ENCOUNTER — Ambulatory Visit: Payer: Self-pay | Admitting: Surgery

## 2021-09-27 ENCOUNTER — Encounter: Payer: Self-pay | Admitting: *Deleted

## 2021-09-27 ENCOUNTER — Ambulatory Visit: Payer: BC Managed Care – PPO | Attending: Surgery | Admitting: Physical Therapy

## 2021-09-27 ENCOUNTER — Encounter: Payer: Self-pay | Admitting: Physical Therapy

## 2021-09-27 VITALS — BP 130/80 | HR 77 | Temp 97.8°F | Resp 20 | Ht 64.0 in | Wt 164.8 lb

## 2021-09-27 VITALS — BP 146/79 | HR 74 | Temp 97.7°F | Resp 18 | Ht 64.0 in | Wt 165.7 lb

## 2021-09-27 DIAGNOSIS — Z170 Estrogen receptor positive status [ER+]: Secondary | ICD-10-CM

## 2021-09-27 DIAGNOSIS — Z171 Estrogen receptor negative status [ER-]: Secondary | ICD-10-CM | POA: Insufficient documentation

## 2021-09-27 DIAGNOSIS — I10 Essential (primary) hypertension: Secondary | ICD-10-CM

## 2021-09-27 DIAGNOSIS — R293 Abnormal posture: Secondary | ICD-10-CM | POA: Insufficient documentation

## 2021-09-27 DIAGNOSIS — C50411 Malignant neoplasm of upper-outer quadrant of right female breast: Secondary | ICD-10-CM

## 2021-09-27 LAB — CBC WITH DIFFERENTIAL/PLATELET
Abs Immature Granulocytes: 0.02 10*3/uL (ref 0.00–0.07)
Basophils Absolute: 0 10*3/uL (ref 0.0–0.1)
Basophils Relative: 1 %
Eosinophils Absolute: 0.1 10*3/uL (ref 0.0–0.5)
Eosinophils Relative: 1 %
HCT: 41.5 % (ref 36.0–46.0)
Hemoglobin: 13.9 g/dL (ref 12.0–15.0)
Immature Granulocytes: 0 %
Lymphocytes Relative: 34 %
Lymphs Abs: 2.2 10*3/uL (ref 0.7–4.0)
MCH: 29.7 pg (ref 26.0–34.0)
MCHC: 33.5 g/dL (ref 30.0–36.0)
MCV: 88.7 fL (ref 80.0–100.0)
Monocytes Absolute: 0.6 10*3/uL (ref 0.1–1.0)
Monocytes Relative: 9 %
Neutro Abs: 3.5 10*3/uL (ref 1.7–7.7)
Neutrophils Relative %: 55 %
Platelets: 277 10*3/uL (ref 150–400)
RBC: 4.68 MIL/uL (ref 3.87–5.11)
RDW: 12.3 % (ref 11.5–15.5)
WBC: 6.4 10*3/uL (ref 4.0–10.5)
nRBC: 0 % (ref 0.0–0.2)

## 2021-09-27 LAB — COMPREHENSIVE METABOLIC PANEL
ALT: 13 U/L (ref 0–44)
AST: 16 U/L (ref 15–41)
Albumin: 4.4 g/dL (ref 3.5–5.0)
Alkaline Phosphatase: 64 U/L (ref 38–126)
Anion gap: 6 (ref 5–15)
BUN: 22 mg/dL (ref 8–23)
CO2: 27 mmol/L (ref 22–32)
Calcium: 9.6 mg/dL (ref 8.9–10.3)
Chloride: 105 mmol/L (ref 98–111)
Creatinine, Ser: 0.82 mg/dL (ref 0.44–1.00)
GFR, Estimated: >60 mL/min (ref >60)
Glucose, Bld: 88 mg/dL (ref 70–99)
Potassium: 4.7 mmol/L (ref 3.5–5.1)
Sodium: 138 mmol/L (ref 135–145)
Total Bilirubin: 0.4 mg/dL (ref 0.3–1.2)
Total Protein: 7.5 g/dL (ref 6.5–8.1)

## 2021-09-27 LAB — GENETIC SCREENING ORDER

## 2021-09-27 NOTE — Progress Notes (Signed)
Radiation Oncology         [phone] ________________________________  Name: Susan Benjamin        MRN: [id number]  Date of Service: 09/27/2021 DOB: 1956-01-21  TU:[id number], Herbie [place], MD  Donnie Mesa, MD     REFERRING PHYSICIAN: Donnie Mesa, MD   DIAGNOSIS: The encounter diagnosis was Malignant neoplasm of upper-outer quadrant of right breast in female, estrogen receptor positive ([place]).   HISTORY OF PRESENT ILLNESS: Susan Benjamin is a 66 y.o. female seen at the request of Dr. Georgette Dover for a new diagnosis of right breast cancer.  The patient was found to have Screening detected mass in the 12 o'clock position that by diagnostic imaging measured up to 1.9 cm.  No evidence of right axillary adenopathy was seen.  She underwent a biopsy on 09/19/2021 that showed a grade 3 invasive ductal carcinoma with necrosis.  Her tumor was ER positive with weak staining intensity at 30%, PR negative and HER2 negative.  Ki-67 was 90%.  She is seen today to discuss treatment recommendations of her cancer.  She has also met with Dr. Chryl Heck who who recommends neoadjuvant chemotherapy.   PREVIOUS RADIATION THERAPY: No   PAST MEDICAL HISTORY:  Past Medical History:  Diagnosis Date   Anxiety    CIN I (cervical intraepithelial neoplasia I)    Ectopic pregnancy    X 2   Endometrial polyp    Seasonal allergies        PAST SURGICAL HISTORY: Past Surgical History:  Procedure Laterality Date   CARPAL TUNNEL RELEASE     CERVICAL BIOPSY  W/ LOOP ELECTRODE EXCISION  2003   DILATION AND CURETTAGE OF UTERUS  2011   ECTOPIC PREGNANCY SURGERY     X 2-Right and Left salpingectomy   FOOT SURGERY     HYSTEROSCOPY  2011   endometrial polyp   TUBAL LIGATION     tubal lig and tubal reversal   VAGINAL HYSTERECTOMY  2012     FAMILY HISTORY:  Family History  Problem Relation Age of Onset   Diabetes Mother    Hypertension Mother    Stroke Mother    Heart disease Mother    Hypertension Father       SOCIAL HISTORY:  reports that she has never smoked. She has never used smokeless tobacco. She reports current alcohol use. She reports that she does not use drugs. She is married and lives in [place]. She is accompanied by her daughter and husband.    ALLERGIES: Patient has no known allergies.   MEDICATIONS:  Current Outpatient Medications  Medication Sig Dispense Refill   loratadine (CLARITIN) 10 MG tablet Take 1 tablet (10 mg total) by mouth daily. 30 tablet 0   No current facility-administered medications for this visit.     REVIEW OF SYSTEMS: On review of systems, the patient reports that she is doing okay and trying to navigate her new diagnosis. No specific breast complaints are noted.      PHYSICAL EXAM:  Wt Readings from Last 3 Encounters:  02/06/21 171 lb (77.6 kg)  01/19/20 176 lb (79.8 kg)  03/12/18 175 lb 9.6 oz (79.7 kg)   Temp Readings from Last 3 Encounters:  09/10/19 (!) 97.4 F (36.3 C) (Temporal)  09/03/19 98.6 F (37 C) (Oral)  05/09/19 98 F (36.7 C) (Oral)   BP Readings from Last 3 Encounters:  02/06/21 122/74  01/19/20 122/74  09/10/19 (!) 150/88   Pulse Readings from Last 3 Encounters:  09/10/19  74  09/03/19 (!) 53  05/09/19 79    In general this is a well appearing caucasian female in no acute distress. She's alert and oriented x4 and appropriate throughout the examination. Cardiopulmonary assessment is negative for acute distress and she exhibits normal effort. Bilateral breast exam is deferred.    ECOG = 0  0 - Asymptomatic (Fully active, able to carry on all predisease activities without restriction)  1 - Symptomatic but completely ambulatory (Restricted in physically strenuous activity but ambulatory and able to carry out work of a light or sedentary nature. For example, light housework, office work)  2 - Symptomatic, <50% in bed during the day (Ambulatory and capable of all self care but unable to carry out any work  activities. Up and about more than 50% of waking hours)  3 - Symptomatic, >50% in bed, but not bedbound (Capable of only limited self-care, confined to bed or chair 50% or more of waking hours)  4 - Bedbound (Completely disabled. Cannot carry on any self-care. Totally confined to bed or chair)  5 - Death   Eustace Pen MM, Creech RH, Tormey DC, et al. [phone]). "Toxicity and response criteria of the [facility]". [place] Oncol. 5 (6): 649-55    LABORATORY DATA:  Lab Results  Component Value Date   WBC 5.3 02/06/2021   HGB 13.6 02/06/2021   HCT 41.3 02/06/2021   MCV 89.6 02/06/2021   PLT 276 02/06/2021   Lab Results  Component Value Date   NA 141 02/06/2021   K 4.4 02/06/2021   CL 105 02/06/2021   CO2 26 02/06/2021   Lab Results  Component Value Date   ALT 13 02/06/2021   AST 17 02/06/2021   BILITOT 0.4 02/06/2021      RADIOGRAPHY: US BREAST LTD UNI RIGHT INC AXILLA  Result Date: 09/13/2021 CLINICAL DATA:  Recall for 2 possible masses in the right breast. EXAM: DIGITAL DIAGNOSTIC UNILATERAL RIGHT MAMMOGRAM WITH TOMOSYNTHESIS AND CAD; ULTRASOUND RIGHT BREAST LIMITED TECHNIQUE: Right digital diagnostic mammography and breast tomosynthesis was performed. The images were evaluated with computer-aided detection.; Targeted ultrasound examination of the right breast was performed COMPARISON:  Previous exam(s). ACR Breast Density Category c: The breast tissue is heterogeneously dense, which may obscure small masses. FINDINGS: The previously noted possible mass in the upper central breast at middle depth persists on additional views as a 1.9 cm oval mass with indistinct margins. The second previously noted possible mass in the upper outer breast at anterior to middle depth dissipates on additional views, consistent with overlapping fibroglandular tissue. Targeted ultrasound of the right breast at the 12 o'clock position 2 cm from the nipple demonstrates a 1.6 x 1.2 x 1.9  cm irregular hypoechoic mass with indistinct and angular margins, corresponding to the mammographic finding. Targeted ultrasound of the right axilla demonstrates lymph nodes with normal morphology. IMPRESSION: 1. Suspicious 1.9 cm mass at the right breast 12 o'clock position for which ultrasound-guided biopsy is recommended. 2. No right axillary lymphadenopathy. RECOMMENDATION: Right breast ultrasound-guided biopsy, scheduled for 09/19/2021. I have discussed the findings and recommendations with the patient. If applicable, a reminder letter will be sent to the patient regarding the next appointment. BI-RADS CATEGORY  4: Suspicious. Electronically Signed   By: Ileana Roup M.D.   On: 09/13/2021 12:32  MM DIAG BREAST TOMO UNI RIGHT  Result Date: 09/13/2021 CLINICAL DATA:  Recall for 2 possible masses in the right breast. EXAM: DIGITAL DIAGNOSTIC UNILATERAL RIGHT MAMMOGRAM WITH TOMOSYNTHESIS  AND CAD; ULTRASOUND RIGHT BREAST LIMITED TECHNIQUE: Right digital diagnostic mammography and breast tomosynthesis was performed. The images were evaluated with computer-aided detection.; Targeted ultrasound examination of the right breast was performed COMPARISON:  Previous exam(s). ACR Breast Density Category c: The breast tissue is heterogeneously dense, which may obscure small masses. FINDINGS: The previously noted possible mass in the upper central breast at middle depth persists on additional views as a 1.9 cm oval mass with indistinct margins. The second previously noted possible mass in the upper outer breast at anterior to middle depth dissipates on additional views, consistent with overlapping fibroglandular tissue. Targeted ultrasound of the right breast at the 12 o'clock position 2 cm from the nipple demonstrates a 1.6 x 1.2 x 1.9 cm irregular hypoechoic mass with indistinct and angular margins, corresponding to the mammographic finding. Targeted ultrasound of the right axilla demonstrates lymph nodes with normal  morphology. IMPRESSION: 1. Suspicious 1.9 cm mass at the right breast 12 o'clock position for which ultrasound-guided biopsy is recommended. 2. No right axillary lymphadenopathy. RECOMMENDATION: Right breast ultrasound-guided biopsy, scheduled for 09/19/2021. I have discussed the findings and recommendations with the patient. If applicable, a reminder letter will be sent to the patient regarding the next appointment. BI-RADS CATEGORY  4: Suspicious. Electronically Signed   By: Ileana Roup M.D.   On: 09/13/2021 12:32  MM CLIP PLACEMENT RIGHT  Result Date: 09/19/2021 CLINICAL DATA:  Assess post biopsy marker clip placement following ultrasound-guided core needle biopsy of a right breast mass. EXAM: 3D DIAGNOSTIC RIGHT MAMMOGRAM POST ULTRASOUND BIOPSY COMPARISON:  Previous exam(s). FINDINGS: 3D Mammographic images were obtained following ultrasound guided biopsy of a right breast mass. The biopsy marking clip is in expected position at the site of biopsy. IMPRESSION: Appropriate positioning of the ribbon shaped biopsy marking clip at the site of biopsy in the within the mass, along its anterior aspect. Final Assessment: Post Procedure Mammograms for Marker Placement Electronically Signed   By: Lajean Manes M.D.   On: 09/19/2021 11:15  [place] RT BREAST BX W LOC DEV 1ST LESION IMG BX SPEC US GUIDE  Addendum Date: 09/21/2021   ADDENDUM REPORT: 09/21/2021 09:21 ADDENDUM: Pathology revealed GRADE III INVASIVE DUCTAL CARCINOMA WITH NECROSIS of the RIGHT breast, 12 o'clock, 2cmfn (ribbon clip). This was found to be concordant by Dr. Lajean Manes. Pathology results were discussed with the patient and her husband Gwyndolyn Saxon) by telephone. The patient reported doing well after the biopsy with tenderness at the site. Post biopsy instructions and care were reviewed and questions were answered. The patient was encouraged to call [facility] for any additional concerns. Surgical consultation has been  arranged with Dr. Donnie Mesa at [facility] on September 25, 2021. Pathology results reported by Stacie Acres RN on 09/21/2021. Electronically Signed   By: Lajean Manes M.D.   On: 09/21/2021 09:21   Result Date: 09/21/2021 CLINICAL DATA:  Patient presents for ultrasound-guided core needle biopsy of a right breast mass. EXAM: ULTRASOUND GUIDED RIGHT BREAST CORE NEEDLE BIOPSY COMPARISON:  Previous exam(s). PROCEDURE: I met with the patient and we discussed the procedure of ultrasound-guided biopsy, including benefits and alternatives. We discussed the high likelihood of a successful procedure. We discussed the risks of the procedure, including infection, bleeding, tissue injury, clip migration, and inadequate sampling. Informed written consent was given. The usual time-out protocol was performed immediately prior to the procedure. Lesion quadrant: Upper outer quadrant: Near 12 o'clock, 2 cm from the nipple. Using sterile technique  and 1% Lidocaine as local anesthetic, under direct ultrasound visualization, a 12 gauge spring-loaded device was used to perform biopsy of the 1.9 cm right breast mass using an inferolateral approach. At the conclusion of the procedure ribbon shaped tissue marker clip was deployed into the biopsy cavity. Follow up 2 view mammogram was performed and dictated separately. IMPRESSION: Ultrasound guided biopsy of a right breast mass. No apparent complications. Electronically Signed: By: Lajean Manes M.D. On: 09/19/2021 11:03      IMPRESSION/PLAN: 1. Stage IB, [id number], functionally triple negative, grade 3 invasive ductal carcinoma of the right breast. Dr. Lisbeth Renshaw discusses the pathology findings and reviews the nature of early stage right breast disease. The consensus from the breast conference includes breast conservation with lumpectomy with sentinel node biopsy. Dr. Chryl Heck recommends adjuvant chemotherapy. Dr. Lisbeth Renshaw also discusses the rationale for external radiotherapy to  the breast  to reduce risks of local recurrence likely followed by antiestrogen therapy. We discussed the risks, benefits, short, and long term effects of radiotherapy, as well as the curative intent, and the patient is interested in proceeding. Dr. Lisbeth Renshaw discusses the delivery and logistics of radiotherapy and anticipates a course of 4 or up to 6 1/2 weeks of radiotherapy to the right breast. We will see her back a few weeks after chemotherapy to discuss the simulation process and anticipate we starting radiotherapy about 4-6 weeks after surgery.  2. Possible genetic predisposition to malignancy. The patient is a candidate for genetic testing given her personal and family history. She has been  offered referral and has already met with genetics. We will follow this expectantly.   In a visit lasting 60 minutes, greater than 50% of the time was spent face to face reviewing her case, as well as in preparation of, discussing, and coordinating the patient's care.  The above documentation reflects my direct findings during this shared patient visit. Please see the separate note by Dr. Lisbeth Renshaw on this date for the remainder of the patient's plan of care.    Carola Rhine, [facility]    **Disclaimer: This note was dictated with voice recognition software. Similar sounding words can inadvertently be transcribed and this note may contain transcription errors which may not have been corrected upon publication of note.**

## 2021-09-27 NOTE — Progress Notes (Signed)
[facility] CONSULT NOTE  Patient Care Team: [name], MD as PCP - General (Family Medicine) Mauro Kaufmann, RN as Oncology Nurse Navigator Rockwell Germany, RN as Oncology Nurse Navigator  CHIEF COMPLAINTS/PURPOSE OF CONSULTATION:  Newly diagnosed breast cancer  HISTORY OF PRESENTING ILLNESS:  Susan Benjamin 66 y.o. female is here because of recent diagnosis of right breast cancer.  August 08, 2021, patient had bilateral screening mammogram which showed 2 possible masses in the right breast which warrant further evaluation.  No concerning findings in the left breast. She had diagnostic mammogram on 09/13/2021 which noted possible mass in the upper central breast at middle depth which persists on additional views is 1.9 cm oval mass with indistinct margins.  Second previously noted possible mass in the upper outer breast at anterior to middle depth dissipates on additional views consistent with overlapping fibroglandular tissue. Ultrasound showed suspicious 1.9 cm mass at the right breast 12 o'clock position for which ultrasound-guided biopsy was recommended no right axillary lymphadenopathy.  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  HER2 negative by IHC.  She is here for an initial consultation with medical oncology to discuss the role of chemotherapy. She arrived today with her daughter and her husband.  She denies any major medical comorbidities.  Retrospectively she felt like she might have palpated the breast lump about 2 months prior to the mammogram.  According to her this might not have grown much.  She had a normal mammogram 14 months prior to this one.  She has 2 kids, her daughter who is here at the time of the appointment is nurse in PACU and in [facility] emergency room.  She is scheduled for  surgery next week and is willing to proceed with the port. I reviewed her records extensively and collaborated the history with the patient.  SUMMARY OF ONCOLOGIC HISTORY: Oncology History   No history exists.     MEDICAL HISTORY:  Past Medical History:  Diagnosis Date   Anxiety    CIN I (cervical intraepithelial neoplasia I)    Ectopic pregnancy    X 2   Endometrial polyp    Seasonal allergies     SURGICAL HISTORY: Past Surgical History:  Procedure Laterality Date   CARPAL TUNNEL RELEASE     CERVICAL BIOPSY  W/ LOOP ELECTRODE EXCISION  2003   DILATION AND CURETTAGE OF UTERUS  2011   ECTOPIC PREGNANCY SURGERY     X 2-Right and Left salpingectomy   FOOT SURGERY     HYSTEROSCOPY  2011   endometrial polyp   TUBAL LIGATION     tubal lig and tubal reversal   VAGINAL HYSTERECTOMY  2012    SOCIAL HISTORY: Social History   Socioeconomic History   Marital status: Married    Spouse name: Not on file   Number of children: Not on file   Years of education: Not on file   Highest education level: Not on file  Occupational History   Not on file  Tobacco Use   Smoking status: Never   Smokeless tobacco: Never  Vaping Use   Vaping Use: Never used  Substance and Sexual Activity   Alcohol use: Yes    Alcohol/week: 0.0 standard drinks    Comment: rarely   Drug use: No   Sexual activity: Yes  Birth control/protection: Surgical    Comment: INTERCOURSE AGE 86, SEXUAL PARTNERS LESS THAN 5  Other Topics Concern   Not on file  Social History Narrative   Not on file   Social Determinants of Health   Financial Resource Strain: Not on file  Food Insecurity: Not on file  Transportation Needs: Not on file  Physical Activity: Not on file  Stress: Not on file  Social Connections: Not on file  Intimate Partner Violence: Not on file    FAMILY HISTORY: Family History  Problem Relation Age of Onset   Diabetes Mother    Hypertension Mother    Stroke Mother    Heart  disease Mother    Hypertension Father     ALLERGIES:  has No Known Allergies.  MEDICATIONS:  Current Outpatient Medications  Medication Sig Dispense Refill   loratadine (CLARITIN) 10 MG tablet Take 1 tablet (10 mg total) by mouth daily. 30 tablet 0   No current facility-administered medications for this visit.    PHYSICAL EXAMINATION:  ECOG PERFORMANCE STATUS: 0 - Asymptomatic  Vitals:   09/27/21 1109  BP: (!) 146/79  Pulse: 74  Resp: 18  Temp: 97.7 F (36.5 C)  SpO2: 99%   Filed Weights   09/27/21 1109  Weight: 165 lb 11.2 oz (75.2 kg)    GENERAL:alert, no distress and comfortable SKIN: skin color, texture, turgor are normal, no rashes or significant lesions EYES: normal, conjunctiva are pink and non-injected, sclera clear OROPHARYNX:no exudate, no erythema and lips, buccal mucosa, and tongue normal  NECK: supple, thyroid normal size, non-tender, without nodularity LYMPH:  no palpable lymphadenopathy in the cervical, axillary or inguinal LUNGS: clear to auscultation and percussion with normal breathing effort HEART: regular rate & rhythm and no murmurs and no lower extremity edema ABDOMEN:abdomen soft, non-tender and normal bowel sounds Musculoskeletal:no cyanosis of digits and no clubbing  PSYCH: alert & oriented x 3 with fluent speech NEURO: no focal motor/sensory deficits BREAST: Right breast palpable lump at 12 o'clock position with some biopsy changes.  No palpable regional adenopathy.  Breast mass measures right under 2 cm on exam.  It is easily mobile in the breast.  Skin appears to be pinchable. Left breast inspection and palpation normal.  No regional adenopathy on the left side.  LABORATORY DATA:  I have reviewed the data as listed Lab Results  Component Value Date   WBC 5.3 02/06/2021   HGB 13.6 02/06/2021   HCT 41.3 02/06/2021   MCV 89.6 02/06/2021   PLT 276 02/06/2021   Lab Results  Component Value Date   NA 141 02/06/2021   K 4.4 02/06/2021    CL 105 02/06/2021   CO2 26 02/06/2021    RADIOGRAPHIC STUDIES: I have personally reviewed the radiological reports and agreed with the findings in the report.  ASSESSMENT AND PLAN:  Malignant neoplasm of upper-outer quadrant of right breast in female, estrogen receptor positive ([place]) This is a very pleasant 66 year old postmenopausal female patient with no significant past medical history referred to medical oncology for new diagnosis of right breast invasive ductal carcinoma.  Patient arrived to the appointment today with her family, her husband and her daughter.  She is healthy at baseline, denies any major medical comorbidities.  She has retrospectively felt the mass may be a couple months prior to mammogram.  She had a normal mammogram 14 months prior to this. Physical examination consistent with right breast mass at 12 o'clock position measuring under 2 cm.  No regional  adenopathy palpated.  Prognostic showed ER weakly +30%, PR negative, HER2 negative and Ki-67 of 90%.  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  We have discussed about docetaxel and cyclophosphamide, mechanism of action, adverse effects of chemotherapy including but not limited to fatigue, nausea, vomiting, increased risk of infections, neuropathy, alopecia, cytopenias and increased risk of infections.  We have discussed about port which may be convenient to the patient to prevent multiple blood draws from her arms.  She is willing to proceed with port placement.  She understands that if she does have positive lymph nodes on final pathology, we may recommend a different type of chemotherapy.  Given functional triple negative tumor, I believe she will be a good candidate for genetic testing. Patient has some trips planned, she likes to try adventures, she  was hoping to do some dog sledding a couple weeks after surgery and she was also hoping to go on a cruise in April with her grandkids. She was hoping she can make it possible while on chemotherapy.  We have again discussed increased risk of infections especially since she will be immunocompromised , she would like to see how she tolerates the first cycle of chemotherapy and decide if she needs to cancel it. Post chemotherapy, she will undergo adjuvant radiation.  Post adjuvant radiation, she will proceed with antiestrogen therapy.  We have briefly discussed about role of immunotherapy and triple negative breast cancer especially when we started neoadjuvant where we tend to continue it in the adjuvant setting.  In her case since we are proceeding with adjuvant chemotherapy, I do not know if there is a definitive role of immunotherapy. All the questions were answered to the best of my knowledge. Thank you for consulting [place] in the care of this patient.  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   All questions were answered. The patient knows to call the clinic with any problems, questions or concerns.    Benay Pike, MD 09/27/21

## 2021-09-27 NOTE — Therapy (Signed)
OUTPATIENT PHYSICAL THERAPY BREAST CANCER BASELINE EVALUATION   Patient Name: Susan Benjamin MRN: [id number] DOB:February 07, 1956, 66 y.o., female Today's Date: 09/27/2021   PT End of Session - 09/27/21 1133     Visit Number 1    Number of Visits 2    Date for PT Re-Evaluation 11/22/21    PT Start Time 1105    PT Stop Time 1126   Also saw pt from 1204 to 1237 for a total of 54 minutes   PT Time Calculation (min) 21 min    Activity Tolerance Patient tolerated treatment well    Behavior During Therapy WFL for tasks assessed/performed             Past Medical History:  Diagnosis Date   Anxiety    CIN I (cervical intraepithelial neoplasia I)    Ectopic pregnancy    X 2   Endometrial polyp    Seasonal allergies    Past Surgical History:  Procedure Laterality Date   CARPAL TUNNEL RELEASE     CERVICAL BIOPSY  W/ LOOP ELECTRODE EXCISION  2003   DILATION AND CURETTAGE OF UTERUS  2011   ECTOPIC PREGNANCY SURGERY     X 2-Right and Left salpingectomy   FOOT SURGERY     HYSTEROSCOPY  2011   endometrial polyp   TUBAL LIGATION     tubal lig and tubal reversal   VAGINAL HYSTERECTOMY  2012   Patient Active Problem List   Diagnosis Date Noted   Malignant neoplasm of upper-outer quadrant of right breast in female, estrogen receptor positive ([place]) 09/25/2021   Benign colon polyp 10/10/2012    PCP: [name], MD  REFERRING PROVIDER: Donnie Mesa, MD  REFERRING DIAG: Right breast cancer  THERAPY DIAG:  Malignant neoplasm of upper-outer quadrant of right breast in female, estrogen receptor positive ([place])  Abnormal posture  ONSET DATE: 09/19/2021  SUBJECTIVE                                                                                                                                                                                           SUBJECTIVE STATEMENT: Patient reports she is here today to be seen by her medical team for her newly diagnosed right breast cancer.    PERTINENT HISTORY:  Patient was diagnosed on 09/19/2021 with right 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 It measures 1.9 cm and is located in the upper outer quadrant. It is weakly ER positive, PR negative and HER2 negative with a Ki67 of 90%.   PATIENT GOALS   reduce lymphedema risk and learn post op HEP.   PAIN:  Are you having  pain? No  PRECAUTIONS: Active CA  WEIGHT BEARING RESTRICTIONS No  FALLS:  Has patient fallen in last 6 months? No, Number of falls: 0  LIVING ENVIRONMENT: Patient lives with: husband Lives in: House/apartment Has following equipment at home: None  OCCUPATION: retired from front office at [employer]: She does not exercise  PRIOR LEVEL OF FUNCTION: Independent   OBJECTIVE  COGNITION:  Overall cognitive status: Within functional limits for tasks assessed    POSTURE:  Forward head and rounded shoulders posture  UPPER EXTREMITY AROM/PROM:  A/PROM Right 09/27/2021 Left 09/27/2021  Shoulder extension 57 50  Shoulder flexion 145 129  Shoulder abduction 160 147  Shoulder internal rotation 67 66  Shoulder external rotation 80 75    (Blank rows = not tested)    CERVICAL AROM: All within normal limits  UPPER EXTREMITY STRENGTH: WNL   LYMPHEDEMA ASSESSMENTS:   LANDMARK RIGHT 09/27/2021 LEFT 09/27/2021  10 cm proximal to olecranon process 30.1 30.8  Olecranon process 25.7 25.9  10 cm proximal to ulnar styloid process 24 23.5  Just proximal to ulnar styloid process 16 16.1  Across hand at thumb web space 18.9 19  At base of 2nd digit 6.3 6.3  (Blank rows = not tested)   L-DEX LYMPHEDEMA SCREENING:  The patient was assessed using the L-Dex machine today to produce a lymphedema index baseline score. The patient will be reassessed on a regular basis (typically every 3 months) to obtain new L-Dex scores. If the score is > 6.5 points away from his/her baseline score indicating onset of subclinical lymphedema, it  will be recommended to wear a compression garment for 4 weeks, 12 hours per day and then be reassessed. If the score continues to be > 6.5 points from baseline at reassessment, we will initiate lymphedema treatment. Assessing in this manner has a 95% rate of preventing clinically significant lymphedema.   L-DEX FLOWSHEETS - 09/27/21 1100       L-DEX LYMPHEDEMA SCREENING   Measurement Type Unilateral    L-DEX MEASUREMENT EXTREMITY Upper Extremity    POSITION  Standing    DOMINANT SIDE Right    At Risk Side Right    BASELINE SCORE (UNILATERAL) -4.4              QUICK DASH SURVEY:   [name] - 09/27/21 0001     Open a tight or new jar No difficulty    Do heavy household chores (wash walls, wash floors) No difficulty    Carry a shopping bag or briefcase No difficulty    Wash your back No difficulty    Use a knife to cut food No difficulty    Recreational activities in which you take some force or impact through your arm, shoulder, or hand (golf, hammering, tennis) No difficulty    During the past week, to what extent has your arm, shoulder or hand problem interfered with your normal social activities with family, friends, neighbors, or groups? Not at all    During the past week, to what extent has your arm, shoulder or hand problem limited your work or other regular daily activities Not at all    Arm, shoulder, or hand pain. None    Tingling (pins and needles) in your arm, shoulder, or hand None    Difficulty Sleeping No difficulty    DASH Score 0 %             PATIENT EDUCATION:  Education details: Lymphedema risk reduction and post  op shoulder/posture HEP Person educated: Patient Education method: Explanation, Demonstration, Handout Education comprehension: Patient verbalized understanding and returned demonstration   HOME EXERCISE PROGRAM: Patient was instructed today in a home exercise program today for post op shoulder range of motion. These included active  assist shoulder flexion in sitting, scapular retraction, wall walking with shoulder abduction, and hands behind head external rotation.  She was encouraged to do these twice a day, holding 3 seconds and repeating 5 times when permitted by her physician.   ASSESSMENT:  CLINICAL IMPRESSION: Patient was diagnosed on 09/19/2021 with right 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 It measures 1.9 cm and is located in the upper outer quadrant. It is weakly ER positive, PR negative and HER2 negative with a Ki67 of 90%. Her multidisciplinary medical team met prior to her assessments to determine a recommended treatment plan. She is planning to have a right lumpectomy and sentinel node biopsy followed by chemotherapy, radiation, and anti-estrogen therapy. She will benefit from a post op PT reassessment to determine needs and from L-Dex screens every 3 months for 2 years to detect subclinical lymphedema.  Pt will benefit from skilled therapeutic intervention to improve on the following deficits: Decreased knowledge of precautions, impaired UE functional use, pain, decreased ROM, postural dysfunction.   PT treatment/interventions: ADL/self-care home management, pt/family education, therapeutic exercise  REHAB POTENTIAL: Excellent  CLINICAL DECISION MAKING: Stable/uncomplicated  EVALUATION COMPLEXITY: Low   GOALS: Goals reviewed with patient? YES  LONG TERM GOALS: (STG=LTG)   Name Target Date Goal status  1 Pt will be able to verbalize understanding of pertinent lymphedema risk reduction practices relevant to her dx specifically related to skin care.  Baseline:  No knowledge 09/27/2021 Achieved at eval  2 Pt will be able to return demo and/or verbalize understanding of the post op HEP related to regaining shoulder ROM. Baseline:  No knowledge 09/27/2021 Achieved at eval  3 Pt will be able to verbalize understanding of the importance of attending the post op After Breast CA Class for further  lymphedema risk reduction education and therapeutic exercise.  Baseline:  No knowledge 09/27/2021 Achieved at eval  4 Pt will demo she has regained full shoulder ROM and function post operatively compared to baselines.  Baseline: See objective measurements taken today. 11/22/2021      PLAN: PT FREQUENCY/DURATION: EVAL and 1 follow up appointment.   PLAN FOR NEXT SESSION: will reassess 3-4 weeks post op to determine needs.   Patient will follow up at outpatient cancer rehab 3-4 weeks following surgery.  If the patient requires physical therapy at that time, a specific plan will be dictated and sent to the referring physician for approval. The patient was educated today on appropriate basic range of motion exercises to begin post operatively and the importance of attending the After Breast Cancer class following surgery.  Patient was educated today on lymphedema risk reduction practices as it pertains to recommendations that will benefit the patient immediately following surgery.  She verbalized good understanding.    Physical Therapy Information for After Breast Cancer Surgery/Treatment:  Lymphedema is a swelling condition that you may be at risk for in your arm if you have lymph nodes removed from the armpit area.  After a sentinel node biopsy, the risk is approximately 5-9% and is higher after an axillary node dissection.  There is treatment available for this condition and it is not life-threatening.  Contact your physician or physical therapist with concerns. You may begin the 4 shoulder/posture exercises (  see additional sheet) when permitted by your physician (typically a week after surgery).  If you have drains, you may need to wait until those are removed before beginning range of motion exercises.  A general recommendation is to not lift your arms above shoulder height until drains are removed.  These exercises should be done to your tolerance and gently.  This is not a "no pain/no gain" type  of recovery so listen to your body and stretch into the range of motion that you can tolerate, stopping if you have pain.  If you are having immediate reconstruction, ask your plastic surgeon about doing exercises as he or she may want you to wait. We encourage you to attend the free one time ABC (After Breast Cancer) class offered by [facility].  You will learn information related to lymphedema risk, prevention and treatment and additional exercises to regain mobility following surgery.  You can call [phone] for more information.  This is offered the 1st and 3rd Monday of each month.  You only attend the class one time. While undergoing any medical procedure or treatment, try to avoid blood pressure being taken or needle sticks from occurring on the arm on the side of cancer.   This recommendation begins after surgery and continues for the rest of your life.  This may help reduce your risk of getting lymphedema (swelling in your arm). An excellent resource for those seeking information on lymphedema is the National Lymphedema Network's web site. It can be accessed at [place].org If you notice swelling in your hand, arm or breast at any time following surgery (even if it is many years from now), please contact your doctor or physical therapist to discuss this.  Lymphedema can be treated at any time but it is easier for you if it is treated early on.  If you feel like your shoulder motion is not returning to normal in a reasonable amount of time, please contact your surgeon or physical therapist.  [facility] [place], [place], [place] [phone]; [address], [place], [place] [address] ABC CLASS After Breast Cancer Class  After Breast Cancer Class is a specially designed exercise class to assist you in a safe recover after having breast cancer surgery.  In this class you will learn how to get back to full function whether your drains were just removed or if you had surgery a month  ago.  This one-time class is held the 1st and 3rd Monday of every month from 11:00 a.m. until 12:00 noon at the [facility] located at [place], [place] [address]  This class is FREE and space is limited. For more information or to register for the next available class, call [phone].  Class Goals  Understand specific stretches to improve the flexibility of you chest and shoulder. Learn ways to safely strengthen your upper body and improve your posture. Understand the warning signs of infection and why you may be at risk for an arm infection. Learn about Lymphedema and prevention.  ** You do not attend this class until after surgery.  Drains must be removed to participate  Patient was instructed today in a home exercise program today for post op shoulder range of motion. These included active assist shoulder flexion in sitting, scapular retraction, wall walking with shoulder abduction, and hands behind head external rotation.  She was encouraged to do these twice a day, holding 3 seconds and repeating 5 times when permitted  by her physician.    Cadden Elizondo,MARTI COOPER, PT 09/27/2021, 12:46 PM

## 2021-09-27 NOTE — Assessment & Plan Note (Signed)
This is a very pleasant 66 year old postmenopausal female patient with no significant past medical history referred to medical oncology for new diagnosis of right breast invasive ductal carcinoma.  Patient arrived to the appointment today with her family, her husband and her daughter.  She is healthy at baseline, denies any major medical comorbidities.  She has retrospectively felt the mass may be a couple months prior to mammogram.  She had a normal mammogram 14 months prior to this. Physical examination consistent with right breast mass at 12 o'clock position measuring under 2 cm.  No regional adenopathy palpated.  Prognostic showed ER weakly +30%, PR negative, HER2 negative and Ki-67 of 90%.  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  We have discussed about docetaxel and cyclophosphamide, mechanism of action, adverse effects of chemotherapy including but not limited to fatigue, nausea, vomiting, increased risk of infections, neuropathy, alopecia, cytopenias and increased risk of infections.  We have discussed about port which may be convenient to the patient to prevent multiple blood draws from her arms.  She is willing to proceed with port placement.  She understands that if she does have positive lymph nodes on final pathology, we may recommend a different type of chemotherapy.  Given functional triple negative tumor, I believe she will be a good candidate for genetic testing. Patient has some trips planned, she likes to try adventures, she was hoping to do some dog sledding a couple weeks after surgery and she was also hoping to go on a cruise in April with her grandkids. She was hoping she can make it possible while on chemotherapy.  We have again discussed increased risk of infections especially since she will be immunocompromised  , she would like to see how she tolerates the first cycle of chemotherapy and decide if she needs to cancel it. Post chemotherapy, she will undergo adjuvant radiation.  Post adjuvant radiation, she will proceed with antiestrogen therapy.  We have briefly discussed about role of immunotherapy and triple negative breast cancer especially when we started neoadjuvant where we tend to continue it in the adjuvant setting.  In her case since we are proceeding with adjuvant chemotherapy, I do not know if there is a definitive role of immunotherapy. All the questions were answered to the best of my knowledge. Thank you for consulting [place] in the care of this patient.  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

## 2021-09-28 ENCOUNTER — Encounter: Payer: Self-pay | Admitting: *Deleted

## 2021-09-28 ENCOUNTER — Telehealth: Payer: Self-pay | Admitting: Hematology and Oncology

## 2021-09-28 ENCOUNTER — Encounter: Payer: Self-pay | Admitting: Genetic Counselor

## 2021-09-28 ENCOUNTER — Encounter (HOSPITAL_BASED_OUTPATIENT_CLINIC_OR_DEPARTMENT_OTHER): Payer: Self-pay | Admitting: Surgery

## 2021-09-28 NOTE — Progress Notes (Signed)
REFERRING PROVIDER: Benay Pike, MD [place], [place] [address]  PRIMARY PROVIDER:  [name], MD  PRIMARY REASON FOR VISIT:  1. Malignant neoplasm of upper-outer quadrant of right breast in female, estrogen receptor positive ([place])     HISTORY OF PRESENT ILLNESS:   [name], a 66 y.o. female, was seen for a [place] cancer genetics consultation at the request of Dr. Chryl Heck due to a personal and family history of cancer.  Ms. Hornbrook presents to clinic today to discuss the possibility of a hereditary predisposition to cancer, to discuss genetic testing, and to further clarify her future cancer risks, as well as potential cancer risks for family members.   CANCER HISTORY:  In January 2023, at the age of 22, Ms. Blakesley was diagnosed with invasive ductal carcinoma of the right breast. The tumor is functionally triple negative. She has a lumpectomy scheduled for 10/06/2021.  RISK FACTORS:  Ovaries intact: yes.  Uterus intact: no.  Menopausal status: postmenopausal.  Mammogram within the last year: yes.  Past Medical History:  Diagnosis Date   Anxiety    CIN I (cervical intraepithelial neoplasia I)    Ectopic pregnancy    X 2   Endometrial polyp    PONV (postoperative nausea and vomiting)    Seasonal allergies     Past Surgical History:  Procedure Laterality Date   CARPAL TUNNEL RELEASE     CERVICAL BIOPSY  W/ LOOP ELECTRODE EXCISION  2003   DILATION AND CURETTAGE OF UTERUS  2011   ECTOPIC PREGNANCY SURGERY     X 2-Right and Left salpingectomy   FOOT SURGERY     HYSTEROSCOPY  2011   endometrial polyp   TUBAL LIGATION     tubal lig and tubal reversal   VAGINAL HYSTERECTOMY  2012    Social History   Socioeconomic History   Marital status: Married    Spouse name: Not on file   Number of children: Not on file   Years of education: Not on file   Highest education level: Not on file  Occupational History   Not on file  Tobacco Use    Smoking status: Never   Smokeless tobacco: Never  Vaping Use   Vaping Use: Never used  Substance and Sexual Activity   Alcohol use: Yes    Alcohol/week: 0.0 standard drinks    Comment: rarely   Drug use: No   Sexual activity: Yes    Birth control/protection: Surgical    Comment: INTERCOURSE AGE 72, SEXUAL PARTNERS LESS THAN 5  Other Topics Concern   Not on file  Social History Narrative   Not on file   Social Determinants of Health   Financial Resource Strain: Not on file  Food Insecurity: Not on file  Transportation Needs: Not on file  Physical Activity: Not on file  Stress: Not on file  Social Connections: Not on file     FAMILY HISTORY:  We obtained a detailed, 4-generation family history.  Significant diagnoses are listed below: Family History  Problem Relation Age of Onset   Diabetes Mother    Hypertension Mother    Stroke Mother    Heart disease Mother    Hypertension Father    Melanoma Father 40       metastasized   Breast cancer Cousin        dx. 72s     Ms. Barbone reports her father was diagnosed with melanoma at age 4 and again at age 8 at  which time the cancer metastasized, he died at age 51 due to metastatic melanoma. She has a paternal first cousin once removed who was diagnosed with breast cancer in her early 53s. Ms. Burggraf is unaware of previous family history of genetic testing for hereditary cancer risks. There is no reported Ashkenazi Jewish ancestry.   GENETIC COUNSELING ASSESSMENT: Ms. Belcher is a 66 y.o. female with a personal and family history of cancer which is somewhat suggestive of a hereditary predisposition to cancer given her breast cancer is functionally triple negative. We, therefore, discussed and recommended the following at today's visit.   DISCUSSION: We discussed that 5 - 10% of cancer is hereditary, with most cases of breast cancer associated with BRCA1/2.  There are other genes that can be associated with hereditary breast  cancer syndromes.  We discussed that testing is beneficial for several reasons including knowing how to follow individuals after completing their treatment, identifying whether potential treatment options would be beneficial, and understanding if other family members could be at risk for cancer and allowing them to undergo genetic testing.   We reviewed the characteristics, features and inheritance patterns of hereditary cancer syndromes. We also discussed genetic testing, including the appropriate family members to test, the process of testing, insurance coverage and turn-around-time for results. We discussed the implications of a negative, positive, carrier and/or variant of uncertain significant result. We will order the STAT BRCAplus panel in hopes that we have the results back before her lumpectomy. Ms. Cincotta reports that if the genetic test results are not available by her surgery date, she would like to continue with a lumpectomy and would consider a mastectomy at a later date.   Ms. Mould  was offered a common hereditary cancer panel (47 genes) and an expanded pan-cancer panel (84 genes). Ms. Timmins was informed of the benefits and limitations of each panel, including that expanded pan-cancer panels contain genes that do not have clear management guidelines at this point in time.  We also discussed that as the number of genes included on a panel increases, the chances of variants of uncertain significance increases.  After considering the benefits and limitations of each gene panel, Ms. Molinaro elected to have [facility] CancerNext-Expanded Panel.  The CancerNext-Expanded gene panel offered by [facility] and includes sequencing, rearrangement, and RNA analysis for the following 77 genes: AIP, ALK, APC, ATM, AXIN2, BAP1, BARD1, BLM, BMPR1A, BRCA1, BRCA2, BRIP1, CDC73, CDH1, CDK4, CDKN1B, CDKN2A, CHEK2, CTNNA1, DICER1, FANCC, FH, FLCN, GALNT12, KIF1B, LZTR1, MAX, MEN1, MET, MLH1, MSH2, MSH3, MSH6,  MUTYH, NBN, NF1, NF2, NTHL1, PALB2, PHOX2B, PMS2, POT1, PRKAR1A, PTCH1, PTEN, RAD51C, RAD51D, RB1, RECQL, RET, SDHA, SDHAF2, SDHB, SDHC, SDHD, SMAD4, SMARCA4, SMARCB1, SMARCE1, STK11, SUFU, TMEM127, TP53, TSC1, TSC2, VHL and XRCC2 (sequencing and deletion/duplication); EGFR, EGLN1, HOXB13, KIT, MITF, PDGFRA, POLD1, and POLE (sequencing only); EPCAM and GREM1 (deletion/duplication only).   Based on Ms. Valade's personal and family history of cancer, she meets medical criteria for genetic testing. Despite that she meets criteria, she may still have an out of pocket cost. We discussed that if her out of pocket cost for testing is over $100, the laboratory will call and confirm whether she wants to proceed with testing.  If the out of pocket cost of testing is less than $100 she will be billed by the genetic testing laboratory.   PLAN: After considering the risks, benefits, and limitations, Ms. Domine provided informed consent to pursue genetic testing and the blood sample was sent to [facility] for analysis of the BRCAplus with reflex to CancerNext-Expanded. Results should be available within approximately 2-3 weeks' time, at which point they will be disclosed by telephone to Ms. Siordia, as will any additional recommendations warranted by these results. Ms. Golda will receive a summary of her genetic counseling visit and a copy of her results once available. This information will also be available in Epic.   Ms. Barrie questions were answered to her satisfaction today. Our contact information was provided should additional questions or concerns arise. Thank you for the referral and allowing [place] to share in the care of your patient.   [name], MS, [facility] Genetic Counselor [email or web address] (P) [phone]  The patient was seen for a total of 35 minutes in face-to-face genetic counseling. The patient brought her daughter and husband. Drs. Lindi Adie and/or Burr Medico were available to  discuss this case as needed.   _______________________________________________________________________ For Office Staff:  Number of people involved in session: 3 Was an Intern/ student involved with case: no

## 2021-09-28 NOTE — Telephone Encounter (Signed)
Sch per 1/25 inbasket, pt aware °

## 2021-09-29 ENCOUNTER — Telehealth: Payer: Self-pay

## 2021-09-29 NOTE — Telephone Encounter (Signed)
Trial:  DCP-001: Use of a Clinical Trial Screening Tool to Address Cancer Health Disparities in the [facility] [place]) Patient Susan Benjamin was identified by this RN as a potential candidate for the above listed study.  This Clinical Research Nurse met with Sherrie Sport, [id number], on _0 @ via phone/email to discuss participation in the above listed research study.  A copy of the informed consent document and separate HIPAA Authorization was provided to the patient via email.  Patient reads, speaks, and understands [place].   Patient was provided with the business card of this Nurse and encouraged to contact the research team with any questions.  Approximately 10 minutes were spent with the patient reviewing the informed consent documents.  Patient was provided the option of taking informed consent documents home to review and was encouraged to review at their convenience with their support network, including other care providers. Emailed consents to [email or web address] at patient request. Marjie Skiff. Kyiah Canepa, RN, BSN, [facility] She   Her   Hers Clinical Research Nurse [facility] Direct Dial [phone]   Pager [phone] 09/29/2021 2:25 PM

## 2021-09-29 NOTE — Telephone Encounter (Signed)
Trial:  [id number] - TREATMENT OF REFRACTORY NAUSEA Patient Susan Benjamin was identified by this nurse as a potential candidate for the above listed study.  This Clinical Research Nurse met with Sherrie Sport, [id number], via phone today in a manner and location that ensures patient privacy to discuss participation in the above listed research study.  A copy of the informed consent document and separate HIPAA Authorization was provided to the patient.  Patient reads, speaks, and understands [place].   Patient was provided with the contact information of this Nurse and encouraged to contact the research team with any questions.  Approximately 10 minutes were spent with the patient reviewing the informed consent documents.  Patient was provided the option of taking informed consent documents home to review and was encouraged to review at their convenience with their support network, including other care providers. Emailed consents to [email or web address] at patient request.  Marjie Skiff. Almond Fitzgibbon, RN, BSN, [facility] She   Her   Hers Clinical Research Nurse [facility] Direct Dial [phone]   Pager [phone] 09/29/2021 2:24 PM

## 2021-10-02 ENCOUNTER — Ambulatory Visit: Payer: BC Managed Care – PPO | Admitting: Physical Therapy

## 2021-10-03 ENCOUNTER — Telehealth: Payer: Self-pay | Admitting: *Deleted

## 2021-10-03 NOTE — Telephone Encounter (Signed)
Spoke with patient to discuss dignicap and next steps.  She will think about it and get back with me and let us know.  Informed her she was having surgery 1st so didn't have to rush on a decision.  Patient verbalized understanding. Encouraged her to call should she have anymore questions or concerns.

## 2021-10-04 ENCOUNTER — Ambulatory Visit: Payer: BC Managed Care – PPO | Admitting: Radiation Oncology

## 2021-10-05 ENCOUNTER — Other Ambulatory Visit: Payer: Self-pay

## 2021-10-05 ENCOUNTER — Other Ambulatory Visit: Payer: Self-pay | Admitting: Surgery

## 2021-10-05 ENCOUNTER — Ambulatory Visit
Admission: RE | Admit: 2021-10-05 | Discharge: 2021-10-05 | Disposition: A | Discharge status: Home / Self Care | Payer: BC Managed Care – PPO | Source: Ambulatory Visit | Attending: Surgery | Admitting: Surgery

## 2021-10-05 DIAGNOSIS — C50911 Malignant neoplasm of unspecified site of right female breast: Secondary | ICD-10-CM

## 2021-10-05 NOTE — Progress Notes (Signed)

## 2021-10-05 NOTE — Progress Notes (Signed)
Sent text reminding pt to come and pick up pre surgery drink and soap.

## 2021-10-05 NOTE — Anesthesia Preprocedure Evaluation (Addendum)
Anesthesia Evaluation  Patient identified by MRN, date of birth, ID band Patient awake    Reviewed: Allergy & Precautions, NPO status , Patient's Chart, lab work & pertinent test results  History of Anesthesia Complications (+) PONV and history of anesthetic complications  Airway Mallampati: II  TM Distance: >3 FB Neck ROM: Full    Dental  (+) Teeth Intact, Dental Advisory Given   Pulmonary neg pulmonary ROS,    breath sounds clear to auscultation       Cardiovascular negative cardio ROS   Rhythm:Regular Rate:Normal     Neuro/Psych Anxiety negative neurological ROS     GI/Hepatic negative GI ROS, Neg liver ROS,   Endo/Other  negative endocrine ROS  Renal/GU negative Renal ROS     Musculoskeletal negative musculoskeletal ROS (+)   Abdominal Normal abdominal exam  (+)   Peds  Hematology negative hematology ROS (+)   Anesthesia Other Findings   Reproductive/Obstetrics                            Anesthesia Physical Anesthesia Plan  ASA: 2  Anesthesia Plan: General   Post-op Pain Management: Regional block   Induction: Intravenous  PONV Risk Score and Plan: 4 or greater and Ondansetron, Dexamethasone, Midazolam and Scopolamine patch - Pre-op  Airway Management Planned: LMA  Additional Equipment: None  Intra-op Plan:   Post-operative Plan: Extubation in OR  Informed Consent:   Plan Discussed with: CRNA  Anesthesia Plan Comments:         Anesthesia Quick Evaluation

## 2021-10-06 ENCOUNTER — Ambulatory Visit (HOSPITAL_COMMUNITY): Payer: BC Managed Care – PPO

## 2021-10-06 ENCOUNTER — Other Ambulatory Visit: Payer: Self-pay

## 2021-10-06 ENCOUNTER — Ambulatory Visit (HOSPITAL_BASED_OUTPATIENT_CLINIC_OR_DEPARTMENT_OTHER): Payer: BC Managed Care – PPO | Admitting: Anesthesiology

## 2021-10-06 ENCOUNTER — Encounter (HOSPITAL_BASED_OUTPATIENT_CLINIC_OR_DEPARTMENT_OTHER)
Admission: RE | Disposition: A | Discharge status: Home / Self Care | Payer: Self-pay | Source: Home / Self Care | Attending: Surgery

## 2021-10-06 ENCOUNTER — Ambulatory Visit
Admission: RE | Admit: 2021-10-06 | Discharge: 2021-10-06 | Disposition: A | Discharge status: Home / Self Care | Payer: BC Managed Care – PPO | Source: Ambulatory Visit | Attending: Surgery | Admitting: Surgery

## 2021-10-06 ENCOUNTER — Ambulatory Visit (HOSPITAL_COMMUNITY)
Admission: RE | Admit: 2021-10-06 | Discharge: 2021-10-06 | Disposition: A | Discharge status: Home / Self Care | Payer: BC Managed Care – PPO | Source: Ambulatory Visit | Attending: Surgery | Admitting: Surgery

## 2021-10-06 ENCOUNTER — Encounter (HOSPITAL_BASED_OUTPATIENT_CLINIC_OR_DEPARTMENT_OTHER): Payer: Self-pay | Admitting: Surgery

## 2021-10-06 ENCOUNTER — Ambulatory Visit (HOSPITAL_BASED_OUTPATIENT_CLINIC_OR_DEPARTMENT_OTHER)
Admission: RE | Admit: 2021-10-06 | Discharge: 2021-10-06 | Disposition: A | Discharge status: Home / Self Care | Payer: BC Managed Care – PPO | Attending: Surgery | Admitting: Surgery

## 2021-10-06 DIAGNOSIS — C50911 Malignant neoplasm of unspecified site of right female breast: Secondary | ICD-10-CM

## 2021-10-06 DIAGNOSIS — Z452 Encounter for adjustment and management of vascular access device: Secondary | ICD-10-CM

## 2021-10-06 DIAGNOSIS — F419 Anxiety disorder, unspecified: Secondary | ICD-10-CM | POA: Insufficient documentation

## 2021-10-06 DIAGNOSIS — D0511 Intraductal carcinoma in situ of right breast: Secondary | ICD-10-CM | POA: Diagnosis present

## 2021-10-06 DIAGNOSIS — Z171 Estrogen receptor negative status [ER-]: Secondary | ICD-10-CM | POA: Diagnosis not present

## 2021-10-06 HISTORY — PX: BREAST LUMPECTOMY WITH RADIOACTIVE SEED AND SENTINEL LYMPH NODE BIOPSY: SHX6550

## 2021-10-06 HISTORY — DX: Other specified postprocedural states: Z98.890

## 2021-10-06 HISTORY — PX: PORTACATH PLACEMENT: SHX2246

## 2021-10-06 HISTORY — DX: Nausea with vomiting, unspecified: R11.2

## 2021-10-06 SURGERY — BREAST LUMPECTOMY WITH RADIOACTIVE SEED AND SENTINEL LYMPH NODE BIOPSY
Anesthesia: General | Site: Chest | Laterality: Right

## 2021-10-06 MED ORDER — AMISULPRIDE (ANTIEMETIC) 5 MG/2ML IV SOLN
10.0000 mg | Freq: Once | INTRAVENOUS | Status: AC | PRN
  Administered 2021-10-06: 10 mg via INTRAVENOUS

## 2021-10-06 MED ORDER — AMISULPRIDE (ANTIEMETIC) 5 MG/2ML IV SOLN
INTRAVENOUS | Status: AC
  Filled 2021-10-06: qty 4

## 2021-10-06 MED ORDER — CHLORHEXIDINE GLUCONATE CLOTH 2 % EX PADS: 6.0000 | MEDICATED_PAD | Freq: Once | CUTANEOUS | Status: DC

## 2021-10-06 MED ORDER — SCOPOLAMINE 1 MG/3DAYS TD PT72
MEDICATED_PATCH | TRANSDERMAL | Status: AC
  Filled 2021-10-06: qty 1

## 2021-10-06 MED ORDER — ONDANSETRON HCL 4 MG/2ML IJ SOLN
INTRAMUSCULAR | Status: AC
  Filled 2021-10-06: qty 2

## 2021-10-06 MED ORDER — LIDOCAINE 2% (20 MG/ML) 5 ML SYRINGE
INTRAMUSCULAR | Status: AC
  Filled 2021-10-06: qty 5

## 2021-10-06 MED ORDER — ACETAMINOPHEN 500 MG PO TABS
ORAL_TABLET | ORAL | Status: AC
  Filled 2021-10-06: qty 2

## 2021-10-06 MED ORDER — SODIUM BICARBONATE 4.2 % IV SOLN
INTRAVENOUS | Status: AC
  Filled 2021-10-06: qty 10

## 2021-10-06 MED ORDER — SCOPOLAMINE 1 MG/3DAYS TD PT72
MEDICATED_PATCH | TRANSDERMAL | Status: DC | PRN
  Administered 2021-10-06: 1 via TRANSDERMAL

## 2021-10-06 MED ORDER — PROMETHAZINE HCL 25 MG/ML IJ SOLN: 6.2500 mg | INTRAMUSCULAR | Status: DC | PRN

## 2021-10-06 MED ORDER — FENTANYL CITRATE (PF) 100 MCG/2ML IJ SOLN
50.0000 ug | Freq: Once | INTRAMUSCULAR | Status: AC
  Administered 2021-10-06: 50 ug via INTRAVENOUS

## 2021-10-06 MED ORDER — ACETAMINOPHEN 500 MG PO TABS
1000.0000 mg | ORAL_TABLET | ORAL | Status: AC
  Administered 2021-10-06: 1000 mg via ORAL

## 2021-10-06 MED ORDER — OXYCODONE HCL 5 MG PO TABS: 5.0000 mg | ORAL_TABLET | Freq: Once | ORAL | Status: DC | PRN

## 2021-10-06 MED ORDER — PROPOFOL 500 MG/50ML IV EMUL
INTRAVENOUS | Status: AC
  Filled 2021-10-06: qty 50

## 2021-10-06 MED ORDER — MIDAZOLAM HCL 2 MG/2ML IJ SOLN
2.0000 mg | Freq: Once | INTRAMUSCULAR | Status: AC
  Administered 2021-10-06: 2 mg via INTRAVENOUS

## 2021-10-06 MED ORDER — FENTANYL CITRATE (PF) 100 MCG/2ML IJ SOLN
INTRAMUSCULAR | Status: AC
  Filled 2021-10-06: qty 2

## 2021-10-06 MED ORDER — CEFAZOLIN SODIUM-DEXTROSE 2-4 GM/100ML-% IV SOLN
INTRAVENOUS | Status: AC
  Filled 2021-10-06: qty 100

## 2021-10-06 MED ORDER — LACTATED RINGERS IV SOLN: INTRAVENOUS | Status: DC

## 2021-10-06 MED ORDER — BUPIVACAINE-EPINEPHRINE (PF) 0.25% -1:200000 IJ SOLN
INTRAMUSCULAR | Status: AC
  Filled 2021-10-06: qty 90

## 2021-10-06 MED ORDER — HEPARIN SOD (PORK) LOCK FLUSH 100 UNIT/ML IV SOLN
INTRAVENOUS | Status: DC | PRN
  Administered 2021-10-06: 500 [IU] via INTRAVENOUS

## 2021-10-06 MED ORDER — BUPIVACAINE-EPINEPHRINE 0.25% -1:200000 IJ SOLN
INTRAMUSCULAR | Status: DC | PRN
  Administered 2021-10-06: 22 mL

## 2021-10-06 MED ORDER — EPHEDRINE SULFATE (PRESSORS) 50 MG/ML IJ SOLN
INTRAMUSCULAR | Status: DC | PRN
Start: 2021-10-06 — End: 2021-10-06
  Administered 2021-10-06 (×2): 10 mg via INTRAVENOUS
  Administered 2021-10-06: 5 mg via INTRAVENOUS

## 2021-10-06 MED ORDER — HEPARIN SOD (PORK) LOCK FLUSH 100 UNIT/ML IV SOLN
INTRAVENOUS | Status: AC
  Filled 2021-10-06: qty 5

## 2021-10-06 MED ORDER — DEXAMETHASONE SODIUM PHOSPHATE 10 MG/ML IJ SOLN
INTRAMUSCULAR | Status: AC
  Filled 2021-10-06: qty 1

## 2021-10-06 MED ORDER — PROPOFOL 10 MG/ML IV BOLUS
INTRAVENOUS | Status: DC | PRN
  Administered 2021-10-06: 150 mg via INTRAVENOUS

## 2021-10-06 MED ORDER — BUPIVACAINE-EPINEPHRINE (PF) 0.5% -1:200000 IJ SOLN
INTRAMUSCULAR | Status: DC | PRN
  Administered 2021-10-06: 30 mL

## 2021-10-06 MED ORDER — FENTANYL CITRATE (PF) 100 MCG/2ML IJ SOLN
INTRAMUSCULAR | Status: DC | PRN
  Administered 2021-10-06: 25 ug via INTRAVENOUS
  Administered 2021-10-06: 50 ug via INTRAVENOUS
  Administered 2021-10-06: 25 ug via INTRAVENOUS

## 2021-10-06 MED ORDER — DEXAMETHASONE SODIUM PHOSPHATE 4 MG/ML IJ SOLN
INTRAMUSCULAR | Status: DC | PRN
  Administered 2021-10-06: 5 mg via INTRAVENOUS

## 2021-10-06 MED ORDER — 0.9 % SODIUM CHLORIDE (POUR BTL) OPTIME
TOPICAL | Status: DC | PRN
  Administered 2021-10-06: 120 mL

## 2021-10-06 MED ORDER — TECHNETIUM TC 99M TILMANOCEPT KIT
1.0000 | PACK | Freq: Once | INTRAVENOUS | Status: AC | PRN
  Administered 2021-10-06: 1 via INTRADERMAL

## 2021-10-06 MED ORDER — MIDAZOLAM HCL 2 MG/2ML IJ SOLN
INTRAMUSCULAR | Status: AC
  Filled 2021-10-06: qty 2

## 2021-10-06 MED ORDER — HYDROCODONE-ACETAMINOPHEN 5-325 MG PO TABS: 1.0000 | ORAL_TABLET | Freq: Four times a day (QID) | ORAL | 0 refills | Status: DC | PRN

## 2021-10-06 MED ORDER — ACETAMINOPHEN 10 MG/ML IV SOLN: 1000.0000 mg | Freq: Once | INTRAVENOUS | Status: DC | PRN

## 2021-10-06 MED ORDER — HEPARIN (PORCINE) IN NACL 2-0.9 UNITS/ML
INTRAMUSCULAR | Status: AC | PRN
  Administered 2021-10-06: 1 via INTRAVENOUS

## 2021-10-06 MED ORDER — ONDANSETRON HCL 4 MG/2ML IJ SOLN
INTRAMUSCULAR | Status: DC | PRN
  Administered 2021-10-06: 4 mg via INTRAVENOUS

## 2021-10-06 MED ORDER — PHENYLEPHRINE HCL (PRESSORS) 10 MG/ML IV SOLN
INTRAVENOUS | Status: DC | PRN
  Administered 2021-10-06 (×2): 80 ug via INTRAVENOUS

## 2021-10-06 MED ORDER — FENTANYL CITRATE (PF) 100 MCG/2ML IJ SOLN
25.0000 ug | INTRAMUSCULAR | Status: DC | PRN
  Administered 2021-10-06: 50 ug via INTRAVENOUS

## 2021-10-06 MED ORDER — ACETAMINOPHEN 160 MG/5ML PO SOLN: 325.0000 mg | ORAL | Status: DC | PRN

## 2021-10-06 MED ORDER — HEPARIN (PORCINE) IN NACL 1000-0.9 UT/500ML-% IV SOLN
INTRAVENOUS | Status: AC
  Filled 2021-10-06: qty 500

## 2021-10-06 MED ORDER — ACETAMINOPHEN 325 MG PO TABS: 325.0000 mg | ORAL_TABLET | ORAL | Status: DC | PRN

## 2021-10-06 MED ORDER — OXYCODONE HCL 5 MG/5ML PO SOLN: 5.0000 mg | Freq: Once | ORAL | Status: DC | PRN

## 2021-10-06 MED ORDER — CEFAZOLIN SODIUM-DEXTROSE 2-4 GM/100ML-% IV SOLN
2.0000 g | INTRAVENOUS | Status: AC
  Administered 2021-10-06: 2 g via INTRAVENOUS

## 2021-10-06 SURGICAL SUPPLY — 59 items
APL PRP STRL LF DISP 70% ISPRP (MISCELLANEOUS) ×2
APL SKNCLS STERI-STRIP NONHPOA (GAUZE/BANDAGES/DRESSINGS) ×2
APPLIER CLIP 9.375 MED OPEN (MISCELLANEOUS) ×3
APR CLP MED 9.3 20 MLT OPN (MISCELLANEOUS) ×2
BENZOIN TINCTURE PRP APPL 2/3 (GAUZE/BANDAGES/DRESSINGS) ×3 IMPLANT
BLADE HEX COATED 2.75 (ELECTRODE) ×4 IMPLANT
BLADE SURG 11 STRL SS (BLADE) ×3 IMPLANT
BLADE SURG 15 STRL LF DISP TIS (BLADE) ×2 IMPLANT
BLADE SURG 15 STRL SS (BLADE) ×3
CANISTER SUCT 1200ML W/VALVE (MISCELLANEOUS) ×1 IMPLANT
CHLORAPREP W/TINT 26 (MISCELLANEOUS) ×3 IMPLANT
CLIP APPLIE 9.375 MED OPEN (MISCELLANEOUS) IMPLANT
COVER BACK TABLE 60X90IN (DRAPES) ×3 IMPLANT
COVER MAYO STAND STRL (DRAPES) ×3 IMPLANT
COVER PROBE 5X48 (MISCELLANEOUS) ×3
COVER PROBE W GEL 5X96 (DRAPES) ×4 IMPLANT
DRAPE C-ARM 42X72 X-RAY (DRAPES) ×3 IMPLANT
DRAPE LAPAROSCOPIC ABDOMINAL (DRAPES) ×4 IMPLANT
DRAPE UTILITY XL STRL (DRAPES) ×4 IMPLANT
DRSG TEGADERM 4X4.75 (GAUZE/BANDAGES/DRESSINGS) ×6 IMPLANT
ELECT REM PT RETURN 9FT ADLT (ELECTROSURGICAL) ×3
ELECTRODE REM PT RTRN 9FT ADLT (ELECTROSURGICAL) ×2 IMPLANT
GAUZE SPONGE 4X4 12PLY STRL LF (GAUZE/BANDAGES/DRESSINGS) ×1 IMPLANT
GLOVE SURG ENC MOIS LTX SZ7 (GLOVE) ×4 IMPLANT
GLOVE SURG POLYISO LF SZ6.5 (GLOVE) ×2 IMPLANT
GLOVE SURG UNDER POLY LF SZ6.5 (GLOVE) ×2 IMPLANT
GLOVE SURG UNDER POLY LF SZ7.5 (GLOVE) ×4 IMPLANT
GOWN STRL REUS W/ TWL LRG LVL3 (GOWN DISPOSABLE) ×4 IMPLANT
GOWN STRL REUS W/ TWL XL LVL3 (GOWN DISPOSABLE) IMPLANT
GOWN STRL REUS W/TWL LRG LVL3 (GOWN DISPOSABLE) ×12
GOWN STRL REUS W/TWL XL LVL3 (GOWN DISPOSABLE) ×3
KIT CVR 48X5XPRB PLUP LF (MISCELLANEOUS) IMPLANT
KIT MARKER MARGIN INK (KITS) ×3 IMPLANT
KIT PORT POWER 8FR ISP CVUE (Port) ×1 IMPLANT
NDL HYPO 25X1 1.5 SAFETY (NEEDLE) ×4 IMPLANT
NDL SAFETY ECLIPSE 18X1.5 (NEEDLE) ×2 IMPLANT
NDL SPNL 22GX3.5 QUINCKE BK (NEEDLE) IMPLANT
NEEDLE HYPO 18GX1.5 SHARP (NEEDLE) ×3
NEEDLE HYPO 25X1 1.5 SAFETY (NEEDLE) ×6 IMPLANT
NEEDLE SPNL 22GX3.5 QUINCKE BK (NEEDLE) ×3 IMPLANT
NS IRRIG 1000ML POUR BTL (IV SOLUTION) ×1 IMPLANT
PACK BASIN DAY SURGERY FS (CUSTOM PROCEDURE TRAY) ×3 IMPLANT
PENCIL SMOKE EVACUATOR (MISCELLANEOUS) ×4 IMPLANT
SLEEVE SCD COMPRESS KNEE MED (STOCKING) ×3 IMPLANT
SPONGE GAUZE 2X2 8PLY STRL LF (GAUZE/BANDAGES/DRESSINGS) ×3 IMPLANT
SPONGE T-LAP 4X18 ~~LOC~~+RFID (SPONGE) ×4 IMPLANT
STRIP CLOSURE SKIN 1/2X4 (GAUZE/BANDAGES/DRESSINGS) ×3 IMPLANT
SUT MON AB 4-0 PC3 18 (SUTURE) ×4 IMPLANT
SUT PROLENE 2 0 CT2 30 (SUTURE) ×3 IMPLANT
SUT VIC AB 3-0 SH 27 (SUTURE) ×3
SUT VIC AB 3-0 SH 27X BRD (SUTURE) ×2 IMPLANT
SUT VICRYL 3-0 CR8 SH (SUTURE) ×1 IMPLANT
SYR 5ML LUER SLIP (SYRINGE) ×3 IMPLANT
SYR BULB EAR ULCER 3OZ GRN STR (SYRINGE) ×3 IMPLANT
SYR CONTROL 10ML LL (SYRINGE) ×6 IMPLANT
TOWEL GREEN STERILE FF (TOWEL DISPOSABLE) ×4 IMPLANT
TRAY FAXITRON CT DISP (TRAY / TRAY PROCEDURE) ×3 IMPLANT
TUBE CONNECTING 20X1/4 (TUBING) ×3 IMPLANT
YANKAUER SUCT BULB TIP NO VENT (SUCTIONS) ×1 IMPLANT

## 2021-10-06 NOTE — Anesthesia Procedure Notes (Signed)
Procedure Name: LMA Insertion Date/Time: 10/06/2021 7:36 AM Performed by: Maryella Shivers, CRNA Pre-anesthesia Checklist: Patient identified, Emergency Drugs available, Suction available and Patient being monitored Patient Re-evaluated:Patient Re-evaluated prior to induction Oxygen Delivery Method: Circle system utilized Preoxygenation: Pre-oxygenation with 100% oxygen Induction Type: IV induction Ventilation: Mask ventilation without difficulty LMA: LMA inserted LMA Size: 4.0 Number of attempts: 1 Airway Equipment and Method: Bite block Placement Confirmation: positive ETCO2 Tube secured with: Tape Dental Injury: Teeth and Oropharynx as per pre-operative assessment

## 2021-10-06 NOTE — Progress Notes (Signed)
Assisted Dr. Johnica Armwood Robert with right, ultrasound guided, pectoralis block. Side rails up, monitors on throughout procedure. See vital signs in flow sheet. Tolerated Procedure well.

## 2021-10-06 NOTE — Interval H&P Note (Signed)
History and Physical Interval Note:  10/06/2021 7:06 AM  Susan Benjamin  has presented today for surgery, with the diagnosis of RIGHT BREAST INVASIVE DUCTAL CARCINOMA.  The various methods of treatment have been discussed with the patient and family. After consideration of risks, benefits and other options for treatment, the patient has consented to  Procedure(s): RIGHT BREAST LUMPECTOMY WITH RADIOACTIVE SEED AND SENTINEL LYMPH NODE BIOPSY (Right) INSERTION PORT-A-CATH (N/A) as a surgical intervention.  The patient's history has been reviewed, patient examined, no change in status, stable for surgery.  I have reviewed the patient's chart and labs.  Questions were answered to the patient's satisfaction.     Susan Benjamin

## 2021-10-06 NOTE — Discharge Instructions (Addendum)
[place] Office Phone Number [phone]  BREAST BIOPSY/ PARTIAL MASTECTOMY: POST OP INSTRUCTIONS  Always review your discharge instruction sheet given to you by the facility where your surgery was performed.  IF YOU HAVE DISABILITY OR FAMILY LEAVE FORMS, YOU MUST BRING THEM TO THE OFFICE FOR PROCESSING.  DO NOT GIVE THEM TO YOUR DOCTOR.  A prescription for pain medication may be given to you upon discharge.  Take your pain medication as prescribed, if needed.  If narcotic pain medicine is not needed, then you may take acetaminophen (Tylenol) or ibuprofen (Advil) as needed. Take your usually prescribed medications unless otherwise directed If you need a refill on your pain medication, please contact your pharmacy.  They will contact our office to request authorization.  Prescriptions will not be filled after 5pm or on week-ends. You should eat very light the first 24 hours after surgery, such as soup, crackers, pudding, etc.  Resume your normal diet the day after surgery. Most patients will experience some swelling and bruising in the breast.  Ice packs and a good support bra will help.  Swelling and bruising can take several days to resolve.  It is common to experience some constipation if taking pain medication after surgery.  Increasing fluid intake and taking a stool softener will usually help or prevent this problem from occurring.  A mild laxative (Milk of Magnesia or Miralax) should be taken according to package directions if there are no bowel movements after 48 hours. Unless discharge instructions indicate otherwise, you may remove your bandages 48 hours after surgery, and you may shower at that time.  You will have steri-strips (small skin tapes) in place directly over the incision.  These strips should be left on the skin for 7-10 days.   Any sutures or staples will be removed at the office during your follow-up visit. ACTIVITIES:  You may resume regular daily  activities (gradually increasing) beginning the next day.  Wearing a good support bra or sports bra minimizes pain and swelling.  You may have sexual intercourse when it is comfortable. You may drive when you no longer are taking prescription pain medication, you can comfortably wear a seatbelt, and you can safely maneuver your car and apply brakes. RETURN TO WORK:  1-2 weeks You should see your doctor in the office for a follow-up appointment approximately two weeks after your surgery.  Your doctors nurse will typically make your follow-up appointment when she calls you with your pathology report.  Expect your pathology report 2-3 business days after your surgery.  You may call to check if you do not hear from [place] after three days. OTHER INSTRUCTIONS: _______________________________________________________________________________________________ _____________________________________________________________________________________________________________________________________ _____________________________________________________________________________________________________________________________________ _____________________________________________________________________________________________________________________________________  WHEN TO CALL YOUR DOCTOR: Fever over 101.0 Nausea and/or vomiting. Extreme swelling or bruising. Continued bleeding from incision. Increased pain, redness, or drainage from the incision.  The clinic staff is available to answer your questions during regular business hours.  Please dont hesitate to call and ask to speak to one of the nurses for clinical concerns.  If you have a medical emergency, go to the nearest emergency room or call 911.  A surgeon from [facility] is always on call at the hospital.  For further questions, please visit [email or web address]      Post Anesthesia Home Care Instructions  Activity: Get plenty of rest  for the remainder of the day. A responsible individual must stay with you for 24 hours following the procedure.  For the next 24 hours, DO NOT: -  Drive a car -Paediatric nurse -Drink alcoholic beverages -Take any medication unless instructed by your physician -Make any legal decisions or sign important papers.  Meals: Start with liquid foods such as gelatin or soup. Progress to regular foods as tolerated. Avoid greasy, spicy, heavy foods. If nausea and/or vomiting occur, drink only clear liquids until the nausea and/or vomiting subsides. Call your physician if vomiting continues.  Special Instructions/Symptoms: Your throat may feel dry or sore from the anesthesia or the breathing tube placed in your throat during surgery. If this causes discomfort, gargle with warm salt water. The discomfort should disappear within 24 hours.  If you had a scopolamine patch placed behind your ear for the management of post- operative nausea and/or vomiting:  1. The medication in the patch is effective for 72 hours, after which it should be removed.  Wrap patch in a tissue and discard in the trash. Wash hands thoroughly with soap and water. 2. You may remove the patch earlier than 72 hours if you experience unpleasant side effects which may include dry mouth, dizziness or visual disturbances. 3. Avoid touching the patch. Wash your hands with soap and water after contact with the patch.        Next dose of Tylenol can be given after 12:33 PM.

## 2021-10-06 NOTE — Op Note (Signed)
Preop diagnosis: Invasive ductal carcinoma - right breast Postop diagnosis: Same Procedure performed: Ultrasound guided right internal jugular vein port placement, right radioactive seed localized lumpectomy, right sentinel lymph node biopsy Surgeon:Arnola Crittendon K Ornella Coderre Anesthesia: General via LMA Indications:  This is a 66 year old female in excellent health who presents with recent finding of a right breast mass.  This was found on a routine screening mammogram.  Her previous mammogram was normal.  Further work-up including ultrasound showing a 1.6 x 1.9 x 1.2 cm mass located at 12:00, 2 cm from the nipple.  The axilla was negative.  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  We recommended lumpectomy and sentinel lymph node biopsy.  Oncology is recommending chemotherapy so we will also place a port.   Description of procedure: The patient is brought to the operating room placed in the supine position on the operating table.  After an adequate level of general anesthesia was obtained, the patient right arm was tucked at her side.  Her right chest and neck were prepped with ChloraPrep and draped sterile fashion.  A timeout was taken to ensure the proper patient and proper procedure.  She was placed in Trendelenburg position.  We interrogated her neck with the ultrasound.  The jugular vein is easily identified.  Using ultrasound guidance we directly cannulated the internal jugular vein with good blood return.  The wire passed easily.  Fluoroscopy confirmed that the wire headed down the right side of the mediastinum.  The needle was removed.  We created a subcutaneous pocket below the right clavicle.  We first anesthetized with local anesthetic.  We created a subcutaneous tunnel from the subcutaneous pocket to the insertion site on the neck.  An 8 French [place] port was assembled and was tunneled from the subcutaneous pocket to the insertion site.  The  catheter was cut to the appropriate length using fluoroscopic guidance.  Using fluoroscopic guidance, we passed the dilator and breakaway sheath over the wire.  The wire and dilator were removed.  The catheter was then advanced through the sheath which was removed.  Fluoroscopy confirmed that there were no kinks along the length of the catheter.  We are able to aspirate blood easily through the port and were able to flush easily.  The port was secured with two interrupted 2-0 Prolene sutures.  3-0 Vicryl was used to close the subcutaneous tissue and 4-0 Monocryl was used to close the skin at both sites.  Benzoin and Steri-Strips were applied.    We then turned our attention to the breast and axilla.  We took down our drapes.  Her right breast and axilla were prepped with ChloraPrep and draped in sterile fashion. A timeout was taken to ensure the proper patient and proper procedure. We interrogated the breast with the neoprobe.  The radioactive seed is located just above the nipple at 12:00.  We made a circumareolar incision around the upper side of the nipple after infiltrating with 0.25% Marcaine. Dissection was carried down in the breast tissue with cautery. We used the neoprobe to guide [place] towards the radioactive seed. We excised an area of tissue around the radioactive seed 2.5 cm in diameter. The specimen was removed and was oriented with a paint kit. Specimen mammogram showed the radioactive seed as well as the biopsy clip within the specimen. This was sent for pathologic examination. There is no residual radioactivity within the biopsy cavity. Clips were placed in all five  margins.  We inspected carefully for hemostasis. The wound was thoroughly irrigated.   We then turned our attention to the axilla.  The settings were adjusted on the Neoprobe and we interrogated the axilla.  There is some weak activity in the axilla.  I made a transverse incision over the area of activity.  We dissected into the axilla  and identified a weakly positive radioactive lymph node.  We excised this and sent this as possible sentinel lymph node.  I then interrogated the axilla again.  There is minimal background activity.  However, I was not satisfied with the low radioactive counts in the axilla.  I began interrogating other parts of the lateral right chest.  She has an area of high activity in the lateral right breast about 2 cm below our incision.  This may be an intramammary sentinel lymph node.  Using the same incision, we are able to dissect down to this area.  We identified a single hot lymph node that was sent as sentinel lymph node #1.  There was minimal background activity.  We irrigated both wounds thoroughly and inspected for hemostasis. The wounds were closed with a deep layer of 3-0 Vicryl and a subcuticular layer of 4-0 Monocryl. Benzoin Steri-Strips were applied.  Sterile dressings were applied to all of the wounds.  The patient was then extubated and brought to the recovery room in stable condition. All sponge, instrument, and needle counts are correct.  Imogene Burn. Georgette Dover, MD, [facility] Surgery  General/ Trauma Surgery  07/25/2020 1:38 PM

## 2021-10-06 NOTE — Progress Notes (Signed)
Emotional support during breast injections with nuc med technician

## 2021-10-06 NOTE — Anesthesia Procedure Notes (Signed)
Anesthesia Regional Block: Pectoralis block   Pre-Anesthetic Checklist: , timeout performed,  Correct Patient, Correct Site, Correct Laterality,  Correct Procedure, Correct Position, site marked,  Risks and benefits discussed,  Surgical consent,  Pre-op evaluation,  At surgeon's request and post-op pain management  Laterality: Right  Prep: chloraprep       Needles:  Injection technique: Single-shot  Needle Type: Echogenic Stimulator Needle     Needle Length: 9cm  Needle Gauge: 21     Additional Needles:   Procedures:,,,, ultrasound used (permanent image in chart),,    Narrative:  Start time: 10/06/2021 6:55 AM End time: 10/06/2021 7:00 AM Injection made incrementally with aspirations every 5 mL.  Performed by: Personally  Anesthesiologist: Effie Berkshire, MD  Additional Notes: Patient tolerated the procedure well. Local anesthetic introduced in an incremental fashion under minimal resistance after negative aspirations. No paresthesias were elicited. After completion of the procedure, no acute issues were identified and patient continued to be monitored by RN.

## 2021-10-06 NOTE — Transfer of Care (Signed)
Immediate Anesthesia Transfer of Care Note  Patient: Susan Benjamin  Procedure(s) Performed: RIGHT BREAST LUMPECTOMY WITH RADIOACTIVE SEED AND SENTINEL LYMPH NODE BIOPSY (Right: Breast) INSERTION PORT-A-CATH (Right: Chest)  Patient Location: PACU  Anesthesia Type:General  Level of Consciousness: sedated  Airway & Oxygen Therapy: Patient Spontanous Breathing and Patient connected to face mask oxygen  Post-op Assessment: Report given to RN and Post -op Vital signs reviewed and stable  Post vital signs: Reviewed and stable  Last Vitals:  Vitals Value Taken Time  BP 131/71 10/06/21 1001  Temp    Pulse 92 10/06/21 1005  Resp 18 10/06/21 1005  SpO2 97 % 10/06/21 1005  Vitals shown include unvalidated device data.  Last Pain:  Vitals:   10/06/21 0630  TempSrc: Oral  PainSc: 0-No pain      Patients Stated Pain Goal: 6 (01/41/03 0131)  Complications: No notable events documented.

## 2021-10-06 NOTE — Anesthesia Postprocedure Evaluation (Signed)
Anesthesia Post Note  Patient: Susan Benjamin  Procedure(s) Performed: RIGHT BREAST LUMPECTOMY WITH RADIOACTIVE SEED AND SENTINEL LYMPH NODE BIOPSY (Right: Breast) INSERTION PORT-A-CATH (Right: Chest)     Patient location during evaluation: PACU Anesthesia Type: General Level of consciousness: awake and alert Pain management: pain level controlled Vital Signs Assessment: post-procedure vital signs reviewed and stable Respiratory status: spontaneous breathing, nonlabored ventilation, respiratory function stable and patient connected to nasal cannula oxygen Cardiovascular status: blood pressure returned to baseline and stable Postop Assessment: no apparent nausea or vomiting Anesthetic complications: no   No notable events documented.  Last Vitals:  Vitals:   10/06/21 1100 10/06/21 1120  BP: (!) 127/94 (!) 141/79  Pulse: 78 80  Resp: 12 15  Temp:    SpO2: 98% 95%    Last Pain:  Vitals:   10/06/21 1120  TempSrc:   PainSc: [name] Maxon K[place]

## 2021-10-09 ENCOUNTER — Telehealth: Payer: Self-pay | Admitting: Genetic Counselor

## 2021-10-09 ENCOUNTER — Encounter: Payer: Self-pay | Admitting: Genetic Counselor

## 2021-10-09 ENCOUNTER — Encounter (HOSPITAL_BASED_OUTPATIENT_CLINIC_OR_DEPARTMENT_OTHER): Payer: Self-pay | Admitting: Surgery

## 2021-10-09 DIAGNOSIS — Z1379 Encounter for other screening for genetic and chromosomal anomalies: Secondary | ICD-10-CM | POA: Insufficient documentation

## 2021-10-09 LAB — SURGICAL PATHOLOGY

## 2021-10-09 NOTE — Telephone Encounter (Signed)
I contacted Susan Benjamin to discuss her genetic testing results. No pathogenic variants were identified in the first 8 genes analyzed. Of note, we are still waiting for additional genes to be analyzed and will contact her when they are available. Detailed clinic note to follow.  The test report has been scanned into EPIC and is located under the Molecular Pathology section of the Results Review tab.  A portion of the result report is included below for reference.   [name], MS, [facility] Genetic Counselor [email or web address] (P) [phone]

## 2021-10-12 ENCOUNTER — Telehealth: Payer: Self-pay

## 2021-10-12 ENCOUNTER — Encounter: Payer: Self-pay | Admitting: *Deleted

## 2021-10-12 NOTE — Telephone Encounter (Signed)
[id number] - TREATMENT OF REFRACTORY NAUSEA  Spoke with Ms Guthridge to confirm interest in enrolling on study. Made appt to meet with her after her Chemo Ed on 10/30/21.  Marjie Skiff Zelena Bushong, RN, BSN, [facility] She   Her   Hers Clinical Research Nurse [place] [phone]   Pager [phone] 10/12/2021 11:31 AM

## 2021-10-24 NOTE — Progress Notes (Signed)
Pharmacist Chemotherapy Monitoring - Initial Assessment    Anticipated start date: 10/31/21   The following has been reviewed per standard work regarding the patient's treatment regimen: The patient's diagnosis, treatment plan and drug doses, and organ/hematologic function Lab orders and baseline tests specific to treatment regimen  The treatment plan start date, drug sequencing, and pre-medications Prior authorization status  Patient's documented medication list, including drug-drug interaction screen and prescriptions for anti-emetics and supportive care specific to the treatment regimen The drug concentrations, fluid compatibility, administration routes, and timing of the medications to be used The patient's access for treatment and lifetime cumulative dose history, if applicable  The patient's medication allergies and previous infusion related reactions, if applicable   Changes made to treatment plan:  N/A  Follow up needed:  Pending authorization for treatment  Treatment plan orders not yet entered   Philomena Course, [facility], 10/24/2021  2:24 PM

## 2021-10-25 ENCOUNTER — Other Ambulatory Visit: Payer: Self-pay

## 2021-10-25 DIAGNOSIS — Z170 Estrogen receptor positive status [ER+]: Secondary | ICD-10-CM

## 2021-10-27 ENCOUNTER — Telehealth: Payer: Self-pay | Admitting: Genetic Counselor

## 2021-10-27 NOTE — Telephone Encounter (Signed)
I contacted [name] to discuss her genetic testing results. No pathogenic variants were identified in the 77 genes analyzed. 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 Detailed clinic note to follow.  The test report has been scanned into EPIC and is located under the Molecular Pathology section of the Results Review tab.  A portion of the result report is included below for reference.   Susan Passy, MS, [facility] Genetic Counselor [email or web address] (P) [phone]

## 2021-10-30 ENCOUNTER — Ambulatory Visit: Payer: BC Managed Care – PPO | Attending: Surgery | Admitting: Physical Therapy

## 2021-10-30 ENCOUNTER — Ambulatory Visit: Payer: BC Managed Care – PPO | Admitting: Hematology and Oncology

## 2021-10-30 ENCOUNTER — Inpatient Hospital Stay (HOSPITAL_BASED_OUTPATIENT_CLINIC_OR_DEPARTMENT_OTHER): Payer: BC Managed Care – PPO | Admitting: Hematology and Oncology

## 2021-10-30 ENCOUNTER — Ambulatory Visit: Payer: Self-pay | Admitting: Genetic Counselor

## 2021-10-30 ENCOUNTER — Ambulatory Visit: Payer: Self-pay | Admitting: Physical Therapy

## 2021-10-30 ENCOUNTER — Encounter: Payer: Self-pay | Admitting: Physical Therapy

## 2021-10-30 ENCOUNTER — Inpatient Hospital Stay: Payer: BC Managed Care – PPO

## 2021-10-30 ENCOUNTER — Inpatient Hospital Stay: Payer: BC Managed Care – PPO | Attending: Hematology and Oncology

## 2021-10-30 ENCOUNTER — Telehealth: Payer: Self-pay | Admitting: *Deleted

## 2021-10-30 ENCOUNTER — Other Ambulatory Visit: Payer: Self-pay

## 2021-10-30 ENCOUNTER — Other Ambulatory Visit: Payer: BC Managed Care – PPO

## 2021-10-30 VITALS — BP 140/83 | HR 72 | Temp 97.5°F | Resp 16 | Ht 64.0 in | Wt 163.1 lb

## 2021-10-30 DIAGNOSIS — R293 Abnormal posture: Secondary | ICD-10-CM | POA: Insufficient documentation

## 2021-10-30 DIAGNOSIS — Z170 Estrogen receptor positive status [ER+]: Secondary | ICD-10-CM

## 2021-10-30 DIAGNOSIS — C50411 Malignant neoplasm of upper-outer quadrant of right female breast: Secondary | ICD-10-CM | POA: Insufficient documentation

## 2021-10-30 DIAGNOSIS — Z803 Family history of malignant neoplasm of breast: Secondary | ICD-10-CM | POA: Diagnosis not present

## 2021-10-30 DIAGNOSIS — Z483 Aftercare following surgery for neoplasm: Secondary | ICD-10-CM | POA: Insufficient documentation

## 2021-10-30 DIAGNOSIS — Z808 Family history of malignant neoplasm of other organs or systems: Secondary | ICD-10-CM | POA: Insufficient documentation

## 2021-10-30 DIAGNOSIS — Z1379 Encounter for other screening for genetic and chromosomal anomalies: Secondary | ICD-10-CM

## 2021-10-30 LAB — CBC WITH DIFFERENTIAL/PLATELET
Abs Immature Granulocytes: 0.01 10*3/uL (ref 0.00–0.07)
Basophils Absolute: 0 10*3/uL (ref 0.0–0.1)
Basophils Relative: 1 %
Eosinophils Absolute: 0.2 10*3/uL (ref 0.0–0.5)
Eosinophils Relative: 3 %
HCT: 40 % (ref 36.0–46.0)
Hemoglobin: 13.2 g/dL (ref 12.0–15.0)
Immature Granulocytes: 0 %
Lymphocytes Relative: 36 %
Lymphs Abs: 1.9 10*3/uL (ref 0.7–4.0)
MCH: 29.2 pg (ref 26.0–34.0)
MCHC: 33 g/dL (ref 30.0–36.0)
MCV: 88.5 fL (ref 80.0–100.0)
Monocytes Absolute: 0.5 10*3/uL (ref 0.1–1.0)
Monocytes Relative: 9 %
Neutro Abs: 2.8 10*3/uL (ref 1.7–7.7)
Neutrophils Relative %: 51 %
Platelets: 269 10*3/uL (ref 150–400)
RBC: 4.52 MIL/uL (ref 3.87–5.11)
RDW: 12.4 % (ref 11.5–15.5)
WBC: 5.4 10*3/uL (ref 4.0–10.5)
nRBC: 0 % (ref 0.0–0.2)

## 2021-10-30 LAB — COMPREHENSIVE METABOLIC PANEL
ALT: 14 U/L (ref 0–44)
AST: 15 U/L (ref 15–41)
Albumin: 4.3 g/dL (ref 3.5–5.0)
Alkaline Phosphatase: 62 U/L (ref 38–126)
Anion gap: 6 (ref 5–15)
BUN: 19 mg/dL (ref 8–23)
CO2: 27 mmol/L (ref 22–32)
Calcium: 9.4 mg/dL (ref 8.9–10.3)
Chloride: 105 mmol/L (ref 98–111)
Creatinine, Ser: 0.83 mg/dL (ref 0.44–1.00)
GFR, Estimated: >60 mL/min (ref >60)
Glucose, Bld: 97 mg/dL (ref 70–99)
Potassium: 4.6 mmol/L (ref 3.5–5.1)
Sodium: 138 mmol/L (ref 135–145)
Total Bilirubin: 0.5 mg/dL (ref 0.3–1.2)
Total Protein: 7.3 g/dL (ref 6.5–8.1)

## 2021-10-30 MED ORDER — PROCHLORPERAZINE MALEATE 10 MG PO TABS: 10.0000 mg | ORAL_TABLET | Freq: Four times a day (QID) | ORAL | 1 refills | Status: DC | PRN

## 2021-10-30 MED ORDER — DEXAMETHASONE 4 MG PO TABS: 8.0000 mg | ORAL_TABLET | Freq: Two times a day (BID) | ORAL | 1 refills | Status: DC

## 2021-10-30 MED ORDER — ONDANSETRON HCL 8 MG PO TABS: 8.0000 mg | ORAL_TABLET | Freq: Two times a day (BID) | ORAL | 1 refills | Status: DC | PRN

## 2021-10-30 MED ORDER — LIDOCAINE-PRILOCAINE 2.5-2.5 % EX CREA: TOPICAL_CREAM | CUTANEOUS | 3 refills | Status: DC

## 2021-10-30 NOTE — Research (Deleted)
Duplicate note

## 2021-10-30 NOTE — Progress Notes (Signed)
START ON PATHWAY REGIMEN - Breast     A cycle is every 21 days:     Docetaxel      Cyclophosphamide   **Always confirm dose/schedule in your pharmacy ordering system**  Patient Characteristics: Postoperative without Neoadjuvant Therapy (Pathologic Staging), Invasive Disease, Adjuvant Therapy, HER2 Negative/Unknown/Equivocal, ER Positive, Node Negative, pT1a-c, pN0/N21mi or pT2 or Higher, pN0, Genomic Testing Not Performed, Chemotherapy Preferred Therapeutic Status: Postoperative without Neoadjuvant Therapy (Pathologic Staging) AJCC Grade: G3 AJCC N Category: pN0 AJCC M Category: cM0 ER Status: Positive (+) AJCC 8 Stage Grouping: IIA HER2 Status: Negative (-) Oncotype Dx Recurrence Score: Not Appropriate AJCC T Category: pT2 PR Status: Negative (-) Adjuvant Therapy Status: No Adjuvant Therapy Received Yet or Changing Initial Adjuvant Regimen due to Tolerance Has this patient completed genomic testing<= No - Did Not Order Test  Treatment Preferred: Chemotherapy Intent of Therapy: Curative Intent, Discussed with Patient

## 2021-10-30 NOTE — Progress Notes (Addendum)
HPI:   Susan Benjamin was previously seen in the [place] clinic due to a personal and family history of cancer and concerns regarding a hereditary predisposition to cancer. Please refer to our prior cancer genetics clinic note for more information regarding our discussion, assessment and recommendations, at the time. Ms. Scorsone recent genetic test results were disclosed to her, as were recommendations warranted by these results. These results and recommendations are discussed in more detail below.  CANCER HISTORY:  Oncology History  Malignant neoplasm of upper-outer quadrant of right breast in female, estrogen receptor positive ([place])  09/25/2021 Initial Diagnosis   Malignant neoplasm of upper-outer quadrant of right breast in female, estrogen receptor positive ([place])    Genetic Testing   [facility] CancerNext-Expanded Panel was Negative. 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 Report date is 10/20/2021.  The CancerNext-Expanded gene panel offered by [facility] and includes sequencing, rearrangement, and RNA analysis for the following 77 genes: AIP, ALK, APC, ATM, AXIN2, BAP1, BARD1, BLM, BMPR1A, BRCA1, BRCA2, BRIP1, CDC73, CDH1, CDK4, CDKN1B, CDKN2A, CHEK2, CTNNA1, DICER1, FANCC, FH, FLCN, GALNT12, KIF1B, LZTR1, MAX, MEN1, MET, MLH1, MSH2, MSH3, MSH6, MUTYH, NBN, NF1, NF2, NTHL1, PALB2, PHOX2B, PMS2, POT1, PRKAR1A, PTCH1, PTEN, RAD51C, RAD51D, RB1, RECQL, RET, SDHA, SDHAF2, SDHB, SDHC, SDHD, SMAD4, SMARCA4, SMARCB1, SMARCE1, STK11, SUFU, TMEM127, TP53, TSC1, TSC2, VHL and XRCC2 (sequencing and deletion/duplication); EGFR, EGLN1, HOXB13, KIT, MITF, PDGFRA, POLD1, and POLE (sequencing only); EPCAM and GREM1 (deletion/duplication only).      FAMILY HISTORY:  We obtained a detailed, 4-generation family history.  Significant diagnoses are listed below:      Family History  Problem Relation Age of Onset   Diabetes Mother     Hypertension  Mother     Stroke Mother     Heart disease Mother     Hypertension Father     Melanoma Father 42        metastasized   Breast cancer Cousin          dx. 58s       Ms. Siharath reports her father was diagnosed with melanoma at age 52 and again at age 70 at which time the cancer metastasized, he died at age 97 due to metastatic melanoma. She has a paternal first cousin once removed who was diagnosed with breast cancer in her early 43s. Ms. Heitman is unaware of previous family history of genetic testing for hereditary cancer risks. There is no reported Ashkenazi Jewish ancestry.    GENETIC TEST RESULTS:  The [facility] CancerNext-Expanded Panel found no pathogenic mutations.   The CancerNext-Expanded gene panel offered by [facility] and includes sequencing, rearrangement, and RNA analysis for the following 77 genes: AIP, ALK, APC, ATM, AXIN2, BAP1, BARD1, BLM, BMPR1A, BRCA1, BRCA2, BRIP1, CDC73, CDH1, CDK4, CDKN1B, CDKN2A, CHEK2, CTNNA1, DICER1, FANCC, FH, FLCN, GALNT12, KIF1B, LZTR1, MAX, MEN1, MET, MLH1, MSH2, MSH3, MSH6, MUTYH, NBN, NF1, NF2, NTHL1, PALB2, PHOX2B, PMS2, POT1, PRKAR1A, PTCH1, PTEN, RAD51C, RAD51D, RB1, RECQL, RET, SDHA, SDHAF2, SDHB, SDHC, SDHD, SMAD4, SMARCA4, SMARCB1, SMARCE1, STK11, SUFU, TMEM127, TP53, TSC1, TSC2, VHL and XRCC2 (sequencing and deletion/duplication); EGFR, EGLN1, HOXB13, KIT, MITF, PDGFRA, POLD1, and POLE (sequencing only); EPCAM and GREM1 (deletion/duplication only).   The test report has been scanned into EPIC and is located under the Molecular Pathology section of the Results Review tab.  A portion of the result report is included below for reference. Genetic testing reported out on 10/20/2021.  Genetic testing identified a variant of uncertain significance (VUS) in the MSH6 gene called p.N112S.  At this time, it is unknown if this variant is associated with an increased risk for cancer or if it is benign, but most uncertain variants are  reclassified to benign. It should not be used to make medical management decisions. With time, we suspect the laboratory will determine the significance of this variant, if any. If the laboratory reclassifies this variant, we will attempt to contact Ms. Zarzycki to discuss it further.   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 Report date is 11/21/2021.  Even though a pathogenic variant was not identified, possible explanations for her personal history of cancer may include: There may be no hereditary risk for cancer in the family. The cancers in Ms. Mccart and/or her family may be due to other genetic or environmental factors. There may be a gene mutation in one of these genes that current testing methods cannot detect, but that chance is small. There could be another gene that has not yet been discovered, or that we have not yet tested, that is responsible for the cancer diagnoses in the family.   ADDITIONAL GENETIC TESTING:  We discussed with Ms. Spurlock that her genetic testing was fairly extensive.  If there are genes identified to increase cancer risk that can be analyzed in the future, we would be happy to discuss and coordinate this testing at that time.    CANCER SCREENING RECOMMENDATIONS:  Ms. Valido test result is considered negative (normal).  This means that we have not identified a hereditary cause for her personal and family history of cancer at this time. Most cancers happen by chance and this negative test suggests that her cancer may fall into this category.    An individual's cancer risk and medical management are not determined by genetic test results alone. Overall cancer risk assessment incorporates additional factors, including personal medical history, family history, and any available genetic information that may result in a personalized plan for cancer prevention and surveillance. Therefore, it is recommended she continue to follow the cancer management  and screening guidelines provided by her oncology and primary healthcare provider.  RECOMMENDATIONS FOR FAMILY MEMBERS:   Since she did not inherit a mutation in a cancer predisposition gene included on this panel, her children could not have inherited a mutation from her in one of these genes. We do not recommend familial testing for the MSH6 variant of uncertain significance (VUS).  FOLLOW-UP:  Cancer genetics is a rapidly advancing field and it is possible that new genetic tests will be appropriate for her and/or her family members in the future. We encouraged her to remain in contact with cancer genetics on an annual basis so we can update her personal and family histories and let her know of advances in cancer genetics that may benefit this family.   Our contact number was provided. Ms. Licht questions were answered to her satisfaction, and she knows she is welcome to call us at anytime with additional questions or concerns.   [name], MS, [facility] Genetic Counselor [email or web address] (P) [phone]

## 2021-10-30 NOTE — Research (Signed)
Trial Name:  [facility]  Patient Susan Benjamin was identified by Dr Chryl Heck as a potential candidate for the above listed study.  This Clinical Research Nurse met with Susan Benjamin, [id number] on 10/30/21 in a manner and location that ensures patient privacy to discuss participation in the above listed research study.  Patient is Accompanied by her daughter and husband .  Patient was previously provided with informed consent documents.  Patient confirmed they have read the informed consent documents.  As outlined in the informed consent form, this Nurse and Sherrie Sport discussed the purpose of the research study, the investigational nature of the study, study procedures and requirements for study participation, potential risks and benefits of study participation, as well as alternatives to participation.  This study is blinded or double-blinded. The patient understands participation is voluntary and they may withdraw from study participation at any time.  Each study arm was reviewed, and randomization discussed.  Potential side effects were reviewed with patient as outlined in the consent form, and patient made aware there may be side effects not yet known. The chance of receiving placebo was discussed. Patient understands enrollment is pending full eligibility review.   Confidentiality and how the patient's information will be used as part of study participation were discussed.  Patient was informed there is not reimbursement provided for their time and effort spent on trial participation.  The patient is encouraged to discuss research study participation with their insurance provider to determine what costs they may incur as part of study participation, including research related injury.    All questions were answered to patient's satisfaction.  The informed consent and separate HIPAA Authorization was reviewed page by page.  The patient's mental and emotional  status is appropriate to provide informed consent, and the patient verbalizes an understanding of study participation.  Patient has agreed to participate in the above listed research study and has voluntarily signed the informed consent version date 09/06/21 and separate HIPAA Authorization, version date 11/19/16  on 10/30/21 at 4:15PM.  The patient was provided with a copy of the signed informed consent form and separate HIPAA Authorization for their reference.  No study specific procedures were obtained prior to the signing of the informed consent document.  Approximately 25 minutes were spent with the patient reviewing the informed consent documents.  Patient was not requested to complete a Release of Information form.   Marjie Skiff Levin Dagostino, RN, BSN, [facility] She   Her   Hers Clinical Research Nurse [facility] Direct Dial [phone]   Pager [phone] 10/30/2021 4:49 PM

## 2021-10-30 NOTE — Telephone Encounter (Signed)
This RN attempted to call pt to inform her of need to reschedule appt tomorrow due to need for insurance authorization clearance.  Obtained pt's identified VM- detailed message left per above with this RN's name for return call.

## 2021-10-30 NOTE — Therapy (Signed)
OUTPATIENT PHYSICAL THERAPY BREAST CANCER POST OP FOLLOW UP   Patient Name: Susan Benjamin MRN: [id number] DOB:[birth year], 66 y.o., female Today's Date: 10/30/2021    Past Medical History:  Diagnosis Date   Anxiety    CIN I (cervical intraepithelial neoplasia I)    Ectopic pregnancy    X 2   Endometrial polyp    PONV (postoperative nausea and vomiting)    Seasonal allergies    Past Surgical History:  Procedure Laterality Date   BREAST LUMPECTOMY WITH RADIOACTIVE SEED AND SENTINEL LYMPH NODE BIOPSY Right 10/06/2021   Procedure: RIGHT BREAST LUMPECTOMY WITH RADIOACTIVE SEED AND SENTINEL LYMPH NODE BIOPSY;  Surgeon: Donnie Mesa, MD;  Location: [place];  Service: General;  Laterality: Right;   CARPAL TUNNEL RELEASE     CERVICAL BIOPSY  W/ LOOP ELECTRODE EXCISION  2003   DILATION AND CURETTAGE OF UTERUS  2011   ECTOPIC PREGNANCY SURGERY     X 2-Right and Left salpingectomy   FOOT SURGERY     HYSTEROSCOPY  2011   endometrial polyp   PORTACATH PLACEMENT Right 10/06/2021   Procedure: INSERTION PORT-A-CATH;  Surgeon: Donnie Mesa, MD;  Location: [place];  Service: General;  Laterality: Right;   TUBAL LIGATION     tubal lig and tubal reversal   VAGINAL HYSTERECTOMY  2012   Patient Active Problem List   Diagnosis Date Noted   Genetic testing 10/09/2021   Malignant neoplasm of upper-outer quadrant of right breast in female, estrogen receptor positive ([place]) 09/25/2021   Benign colon polyp 10/10/2012    PCP: [name], MD  REFERRING PROVIDER: [name], MD  REFERRING DIAG: s/p right lumpectomy and SLNB  THERAPY DIAG:  Malignant neoplasm of upper-outer quadrant of right breast in female, estrogen receptor positive ([place])  Abnormal posture  Aftercare following surgery for neoplasm  ONSET DATE: 10/06/2021  SUBJECTIVE:                                                                                                                                                                                            SUBJECTIVE STATEMENT: Patient reports she underwent a right lumpectomy and sentinel node biopsy (3 negative nodes) on 10/06/2021. She begins chemotherapy tomorrow followed by radiation.  PERTINENT HISTORY:  Patient was diagnosed on 09/19/2021 with right 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 She underwent a right lumpectomy and sentinel node biopsy (3 negative nodes) on 10/06/2021. It is weakly ER positive, PR negative and HER2 negative with a Ki67 of 90%.   PATIENT GOALS:  Reassess how my recovery is going related to arm function, pain, and  swelling.  PAIN:  Are you having pain? Yes NPRS scale: 2/10 Pain location: Right axilla Pain orientation: Right  PAIN TYPE: aching Pain description: intermittent  Aggravating factors: Reaching, twisting Relieving factors: resting  PRECAUTIONS: Recent Surgery, right UE Lymphedema risk  Patient is walking at least 30 min every day.   OBJECTIVE:   PATIENT SURVEYS:  QUICK DASH:  Quick Dash - 10/30/21 0001     Open a tight or new jar No difficulty    Do heavy household chores (wash walls, wash floors) No difficulty    Carry a shopping bag or briefcase No difficulty    Wash your back No difficulty    Use a knife to cut food No difficulty    Recreational activities in which you take some force or impact through your arm, shoulder, or hand (golf, hammering, tennis) Mild difficulty    During the past week, to what extent has your arm, shoulder or hand problem interfered with your normal social activities with family, friends, neighbors, or groups? Not at all    During the past week, to what extent has your arm, shoulder or hand problem limited your work or other regular daily activities Not at all    Arm, shoulder, or hand pain. Mild    Tingling (pins and needles) in your arm, shoulder, or hand None    Difficulty Sleeping Mild difficulty    DASH Score 6.82 %               OBSERVATIONS:  Right axillary and breast incisions are both healing well   POSTURE:  Forward head and rounded shoulders  LYMPHEDEMA ASSESSMENT:   UPPER EXTREMITY AROM/PROM:   A/PROM Right 09/27/2021 Left 09/27/2021 Right 10/30/2021  Shoulder extension 57 50 60  Shoulder flexion 145 129 136  Shoulder abduction 160 147 127  Shoulder internal rotation 67 66 68  Shoulder external rotation 80 75 78                          (Blank rows = not tested)       CERVICAL AROM: All within normal limits   UPPER EXTREMITY STRENGTH: WNL     LYMPHEDEMA ASSESSMENTS:    LANDMARK RIGHT 09/27/2021 LEFT 09/27/2021 RIGHT 10/30/2021 LEFT 10/30/2021  10 cm proximal to olecranon process 30.1 30.8 30.4 30.5  Olecranon process 25.7 25.9 25.9 25.7  10 cm proximal to ulnar styloid process 24 23.5 24.3 23.4  Just proximal to ulnar styloid process 16 16.1 15.7 15.9  Across hand at thumb web space 18.9 19 18.5 18.9  At base of 2nd digit 6.3 6.3 5.9 6.1  (Blank rows = not tested)       Surgery type/Date: Right lumpectomy and sentinel node biopsy 10/06/2021 Number of lymph nodes removed: 3 Current/past treatment (chemo, radiation, hormone therapy): none Other symptoms:  Heaviness/tightness Yes Pain Yes Pitting edema No Infections No Decreased scar mobility No Stemmer sign No   PATIENT EDUCATION:  Education details: Aftercare; scar massage; HEP Person educated: Patient Education method: Explanation, Demonstration, and Handouts Education comprehension: verbalized understanding and returned demonstration   HOME EXERCISE PROGRAM:  Reviewed previously given post op HEP.  ASSESSMENT:  CLINICAL IMPRESSION: Patient is doing very well s/p right lumpectomy and sentinel node biopsy (3 negative nodes) on 10/06/2021. Her incisions have healed well, she has no sign of lymphedema, and she has regained shoulder ROM except abduction is lacking 20 degrees. She has no c/o pain at rest and  feels only  tightness with reaching overhead. She is anxious to begin chemotherapy tomorrow. She has no further need for PT at this time.  Pt will benefit from skilled therapeutic intervention to improve on the following deficits: Decreased knowledge of precautions, impaired UE functional use, pain, decreased ROM, postural dysfunction.   PT treatment/interventions: ADL/Self care home management, Therapeutic exercises, Therapeutic activity, Neuro Muscular re-education, Balance training, Gait training, Patient/Family education, and Joint mobilization     GOALS: Goals reviewed with patient? Yes  LONG TERM GOALS:  (STG=LTG)  GOALS Name Target Date Goal status  1 Pt will demonstrate she has regained full shoulder ROM and function post operatively compared to baselines.  Baseline: 12/25/2021 MET     PLAN: PT FREQUENCY/DURATION: N/A  PLAN FOR NEXT SESSION: D/C   PHYSICAL THERAPY DISCHARGE SUMMARY  Visits from Start of Care: 2  Current functional level related to goals / functional outcomes: See above for objective measurements   Remaining deficits: Mild right shoulder abduction ROM limitation   Education / Equipment: HEP and lymphedema risk reduction education   Patient agrees to discharge. Patient goals were partially met. Patient is being discharged due to being pleased with the current functional level.    [facility]  [address]  [place] [address]  [phone]  After Breast Cancer Class It is recommended you attend the ABC class to be educated on lymphedema risk reduction. This class is free of charge and lasts for 1 hour. It is a 1-time class. You will need to download the Webex app either on your phone or computer. We will send you a link the night before or the morning of the class. You should be able to click on that link to join the class. This is not a confidential class. You don't have to turn your camera on, but other participants may be  able to see your email address.  You are scheduled for November 06, 2021 at 11:00.  Scar massage You can begin gentle scar massage to you incision sites. Gently place one hand on the incision and move the skin (without sliding on the skin) in various directions. Do this for a few minutes and then you can gently massage either coconut oil or vitamin E cream into the scars.  Compression garment You should continue wearing your compression bra until you feel like you no longer have swelling.  Home exercise Program Continue doing the exercises you were given until you feel like you can do them without feeling any tightness at the end.   Walking Program Studies show that 30 minutes of walking per day (fast enough to elevate your heart rate) can significantly reduce the risk of a cancer recurrence. If you can't walk due to other medical reasons, we encourage you to find another activity you could do (like a stationary bike or water exercise).  Posture After breast cancer surgery, people frequently sit with rounded shoulders posture because it puts their incisions on slack and feels better. If you sit like this and scar tissue forms in that position, you can become very tight and have pain sitting or standing with good posture. Try to be aware of your posture and sit and stand up tall to heal properly.  Follow up PT: It is recommended you return every 3 months for the first 3 years following surgery to be assessed on the SOZO machine for an L-Dex score. This helps prevent clinically significant lymphedema in 95% of patients. These  follow up screens are 10 minute appointments that you are not billed for. You are scheduled for Jan 08, 2022 at 10:10.  Annia Friendly, [place] 10/30/21 12:52 PM

## 2021-10-30 NOTE — Patient Instructions (Addendum)
Closed Chain: Shoulder Abduction / Adduction - on Wall    One hand on wall, step to side and return. Stepping causes shoulder to abduct and adduct. Step _5__ times, holding 5 seconds, _2__ times per day.  http://ss.exer.us/267   Copyright  VHI. All rights reserved.    [facility]  [address], [address]  [place] [address]  [phone]  After Breast Cancer Class It is recommended you attend the ABC class to be educated on lymphedema risk reduction. This class is free of charge and lasts for 1 hour. It is a 1-time class. You will need to download the Webex app either on your phone or computer. We will send you a link the night before or the morning of the class. You should be able to click on that link to join the class. This is not a confidential class. You don't have to turn your camera on, but other participants may be able to see your email address.  You are scheduled for November 06, 2021 at 11:00.  Scar massage You can begin gentle scar massage to you incision sites. Gently place one hand on the incision and move the skin (without sliding on the skin) in various directions. Do this for a few minutes and then you can gently massage either coconut oil or vitamin E cream into the scars.  Compression garment You should continue wearing your compression bra until you feel like you no longer have swelling.  Home exercise Program Continue doing the exercises you were given until you feel like you can do them without feeling any tightness at the end.   Walking Program Studies show that 30 minutes of walking per day (fast enough to elevate your heart rate) can significantly reduce the risk of a cancer recurrence. If you can't walk due to other medical reasons, we encourage you to find another activity you could do (like a stationary bike or water exercise).  Posture After breast cancer surgery, people frequently sit with rounded shoulders posture because it puts  their incisions on slack and feels better. If you sit like this and scar tissue forms in that position, you can become very tight and have pain sitting or standing with good posture. Try to be aware of your posture and sit and stand up tall to heal properly.  Follow up PT: It is recommended you return every 3 months for the first 3 years following surgery to be assessed on the SOZO machine for an L-Dex score. This helps prevent clinically significant lymphedema in 95% of patients. These follow up screens are 10 minute appointments that you are not billed for. You are scheduled for Jan 08, 2022 at 10:10.

## 2021-10-30 NOTE — Research (Signed)
[id number] - TREATMENT OF REFRACTORY NAUSEA  Met with patient and her husband and daughter for study consent. Confirmed the following:  Pt can read, write, and understand English, and she is able to consent. She is post-hysterectomy. She has no warfarin, antipsychotic, benzodiazepine, amifostine, anticholinergic use. No uncontrolled DM or hyperglycemia. No known hypersensitivity to phenothiazines or olanzapine. No known CNS disease. No dementia dx. No known cardiac arrhythmia, no hx MI no dx CHF.  Reviewed all eligibility & ineligibility criteria. No barriers to enrollment at this time.  Marjie Skiff Brittnee Gaetano, RN, BSN, [facility] She   Her   Hers Clinical Research Nurse [facility] Direct Dial [phone]   Pager [phone] 10/30/2021 4:56 PM

## 2021-10-30 NOTE — Research (Deleted)
Trial:  DCP-001: Use of a Clinical Trial Screening Tool to Address Cancer Health Disparities in the [place] Program [place]) Patient Susan Benjamin was identified by this RN as a potential candidate for the above listed study.  This Clinical Research Nurse met with HAGAR SADIQ, [id number], on 10/30/21 in a manner and location that ensures patient privacy to discuss participation in the above listed research study.  Patient is Accompanied by her husband and daughter .  A copy of the informed consent document and separate HIPAA Authorization was provided to the patient.  Patient reads, speaks, and understands [place].   Patient was provided with the business card of this Nurse and encouraged to contact the research team with any questions.  Approximately 10 minutes were spent with the patient reviewing the informed consent documents.  Patient was provided the option of taking informed consent documents home to review and was encouraged to review at their convenience with their support network, including other care providers. Patient took the consent documents home to review. Marjie Skiff Jaleil Renwick, RN, BSN, [facility] She   Her   Hers Clinical Research Nurse [facility] Direct Dial [phone]   Pager [phone] 10/30/2021 4:37 PM

## 2021-10-30 NOTE — Progress Notes (Signed)
[facility] CONSULT NOTE  Patient Care Team: [name], MD as PCP - General (Family Medicine) Mauro Kaufmann, RN as Oncology Nurse Navigator Rockwell Germany, RN as Oncology Nurse Navigator Benay Pike, MD as Consulting Physician (Hematology and Oncology)  CHIEF COMPLAINTS/PURPOSE OF CONSULTATION:  Newly diagnosed breast cancer  HISTORY OF PRESENTING ILLNESS:  Susan Benjamin 66 y.o. female is here because of recent diagnosis of right breast cancer.  SUMMARY OF ONCOLOGIC HISTORY: Oncology History  Malignant neoplasm of upper-outer quadrant of right breast in female, estrogen receptor positive ([place])  08/08/2021 Imaging   August 08, 2021, patient had bilateral screening mammogram which showed 2 possible masses in the right breast which warrant further evaluation.  No concerning findings in the left breast. She had diagnostic mammogram on 09/13/2021 which noted possible mass in the upper central breast at middle depth which persists on additional views is 1.9 cm oval mass with indistinct margins.  Second previously noted possible mass in the upper outer breast at anterior to middle depth dissipates on additional views consistent with overlapping fibroglandular tissue. Ultrasound showed suspicious 1.9 cm mass at the right breast 12 o'clock position for which ultrasound-guided biopsy was recommended no right axillary lymphadenopathy.   09/25/2021 Initial Diagnosis   Malignant neoplasm of upper-outer quadrant of right breast in female, estrogen receptor positive ([place])    Genetic Testing   [facility] CancerNext-Expanded Panel was Negative. 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 Report date is 10/20/2021.  The CancerNext-Expanded gene panel offered by [facility] and includes sequencing, rearrangement, and RNA analysis for the following 77 genes: AIP, ALK, APC, ATM, AXIN2, BAP1, BARD1, BLM, BMPR1A, BRCA1, BRCA2, BRIP1, CDC73, CDH1,  CDK4, CDKN1B, CDKN2A, CHEK2, CTNNA1, DICER1, FANCC, FH, FLCN, GALNT12, KIF1B, LZTR1, MAX, MEN1, MET, MLH1, MSH2, MSH3, MSH6, MUTYH, NBN, NF1, NF2, NTHL1, PALB2, PHOX2B, PMS2, POT1, PRKAR1A, PTCH1, PTEN, RAD51C, RAD51D, RB1, RECQL, RET, SDHA, SDHAF2, SDHB, SDHC, SDHD, SMAD4, SMARCA4, SMARCB1, SMARCE1, STK11, SUFU, TMEM127, TP53, TSC1, TSC2, VHL and XRCC2 (sequencing and deletion/duplication); EGFR, EGLN1, HOXB13, KIT, MITF, PDGFRA, POLD1, and POLE (sequencing only); EPCAM and GREM1 (deletion/duplication only).    10/06/2021 Surgery   A. BREAST, RIGHT, LUMPECTOMY:  -  Invasive carcinoma of no special type (ductal), grade3 (total  Nottingham score 9), 25 mm in greatest dimension.  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  Prognostics from initial biopsy showed ER +30%, weak staining, PR 0%, negative, Ki-67 of 90%.  HER2 negative by IHC.     10/31/2021 -  Chemotherapy   Patient is on Treatment Plan : BREAST TC q21d      She is here for follow up after right lumpectomy. She is healing well. No complaints.  She is very pleased with the outcome so far.  MEDICAL HISTORY:  Past Medical History:  Diagnosis Date   Anxiety    CIN I (cervical intraepithelial neoplasia I)    Ectopic pregnancy    X 2   Endometrial polyp    PONV (postoperative nausea and vomiting)    Seasonal allergies     SURGICAL HISTORY: Past Surgical History:  Procedure Laterality Date   BREAST LUMPECTOMY WITH RADIOACTIVE SEED AND SENTINEL LYMPH NODE BIOPSY Right 10/06/2021   Procedure: RIGHT BREAST LUMPECTOMY WITH RADIOACTIVE SEED AND SENTINEL LYMPH NODE BIOPSY;  Surgeon: Donnie Mesa, MD;  Location: [place];  Service: General;  Laterality: Right;   CARPAL TUNNEL  RELEASE     CERVICAL BIOPSY  W/ LOOP ELECTRODE EXCISION  2003   DILATION AND CURETTAGE OF UTERUS  2011   ECTOPIC PREGNANCY SURGERY     X 2-Right and Left salpingectomy   FOOT SURGERY     HYSTEROSCOPY  2011    endometrial polyp   PORTACATH PLACEMENT Right 10/06/2021   Procedure: INSERTION PORT-A-CATH;  Surgeon: Donnie Mesa, MD;  Location: [place];  Service: General;  Laterality: Right;   TUBAL LIGATION     tubal lig and tubal reversal   VAGINAL HYSTERECTOMY  2012    SOCIAL HISTORY: Social History   Socioeconomic History   Marital status: Married    Spouse name: Not on file   Number of children: Not on file   Years of education: Not on file   Highest education level: Not on file  Occupational History   Not on file  Tobacco Use   Smoking status: Never   Smokeless tobacco: Never  Vaping Use   Vaping Use: Never used  Substance and Sexual Activity   Alcohol use: Yes    Alcohol/week: 0.0 standard drinks    Comment: rarely   Drug use: No   Sexual activity: Yes    Birth control/protection: Surgical    Comment: INTERCOURSE AGE 85, SEXUAL PARTNERS LESS THAN 5  Other Topics Concern   Not on file  Social History Narrative   Not on file   Social Determinants of Health   Financial Resource Strain: Not on file  Food Insecurity: Not on file  Transportation Needs: Not on file  Physical Activity: Not on file  Stress: Not on file  Social Connections: Not on file  Intimate Partner Violence: Not on file    FAMILY HISTORY: Family History  Problem Relation Age of Onset   Diabetes Mother    Hypertension Mother    Stroke Mother    Heart disease Mother    Hypertension Father    Melanoma Father 96       metastasized   Breast cancer Cousin        dx. 86s    ALLERGIES:  has No Known Allergies.  MEDICATIONS:  Current Outpatient Medications  Medication Sig Dispense Refill   dexamethasone (DECADRON) 4 MG tablet Take 2 tablets (8 mg total) by mouth 2 (two) times daily. Start the day before Taxotere. Then again the day after chemo for 3 days. 30 tablet 1   HYDROcodone-acetaminophen (NORCO/VICODIN) 5-325 MG tablet Take 1 tablet by mouth every 6 (six) hours as needed for  moderate pain. (Patient not taking: Reported on 10/30/2021) 15 tablet 0   lidocaine-prilocaine (EMLA) cream Apply to affected area once 30 g 3   ondansetron (ZOFRAN) 8 MG tablet Take 1 tablet (8 mg total) by mouth 2 (two) times daily as needed for refractory nausea / vomiting. Start on day 3 after chemo. 30 tablet 1   prochlorperazine (COMPAZINE) 10 MG tablet Take 1 tablet (10 mg total) by mouth every 6 (six) hours as needed (Nausea or vomiting). 30 tablet 1   No current facility-administered medications for this visit.    PHYSICAL EXAMINATION:  ECOG PERFORMANCE STATUS: 0 - Asymptomatic  Vitals:   10/30/21 1259  BP: 140/83  Pulse: 72  Resp: 16  Temp: (!) 97.5 F (36.4 C)  SpO2: 99%   Filed Weights   10/30/21 1259  Weight: 163 lb 1.6 oz (74 kg)    GENERAL:alert, no distress and comfortable Focused exam, right breast surgical scar appears  well healed.  LABORATORY DATA:  I have reviewed the data as listed Lab Results  Component Value Date   WBC 5.4 10/30/2021   HGB 13.2 10/30/2021   HCT 40.0 10/30/2021   MCV 88.5 10/30/2021   PLT 269 10/30/2021   Lab Results  Component Value Date   NA 138 10/30/2021   K 4.6 10/30/2021   CL 105 10/30/2021   CO2 27 10/30/2021    RADIOGRAPHIC STUDIES: I have personally reviewed the radiological reports and agreed with the findings in the report.  ASSESSMENT AND PLAN:  Malignant neoplasm of upper-outer quadrant of right breast in female, estrogen receptor positive ([place]) This is a very pleasant 66 year old postmenopausal female patient with no significant past medical history referred to medical oncology for new diagnosis of right breast invasive ductal carcinoma. We have discussed about consideration for adjuvant chemotherapy given her tumor with high grade, high proliferation index of 90% on the original specimen, The tumor has many unfavorable features, including grade 3, ER weakly positive, PR negative, Ki 90%. We discussed about  oncotype testing however given several unfavorable features, I would still recommend adjuvant chemotherapy. Patient doesn't want to proceed with oncotype testing. She is agreeable to proceed with chemotherapy. Post chemo, she will proceed with adjuvant radiation followed by antiestrogen therapy. Given LN negative disease, I think adjuvant TC for 4 cycles is a reasonable regimen. We have discussed in the past as well as today about adverse effects of chemotherapy including but not limited to fatigue, nausea, vomiting, diarrhea, cytopenias, neuropathy etc. She understands that some of the side effects are life threatening and permanent. Chemo teach scheduled today. Anticipate chemotherapy start tomorrow. Thank you for consulting [place] in the care of this patient.  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    All questions were answered. The patient knows to call the clinic with any problems, questions or concerns.    Benay Pike, MD 10/30/21

## 2021-10-30 NOTE — Assessment & Plan Note (Addendum)
This is a very pleasant 66 year old postmenopausal female patient with no significant past medical history referred to medical oncology for new diagnosis of right breast invasive ductal carcinoma. We have discussed about consideration for adjuvant chemotherapy given her tumor with high grade, high proliferation index of 90% on the original specimen, The tumor has many unfavorable features, including grade 3, ER weakly positive, PR negative, Ki 90%. We discussed about oncotype testing however given several unfavorable features, I would still recommend adjuvant chemotherapy. Patient doesn't want to proceed with oncotype testing. She is agreeable to proceed with chemotherapy. Post chemo, she will proceed with adjuvant radiation followed by antiestrogen therapy. Given LN negative disease, I think adjuvant TC for 4 cycles is a reasonable regimen. We have discussed in the past as well as today about adverse effects of chemotherapy including but not limited to fatigue, nausea, vomiting, diarrhea, cytopenias, neuropathy etc. She understands that some of the side effects are life threatening and permanent. Chemo teach scheduled today. Anticipate chemotherapy start next week Thank you for consulting [place] in the care of this patient.  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

## 2021-10-30 NOTE — Research (Signed)
Trial:  DCP-001: Use of a Clinical Trial Screening Tool to Address Cancer Health Disparities in the [place] Program [place]) Patient Susan Benjamin was identified by this RN as a potential candidate for the above listed study.  This Clinical Research Nurse met with Susan Benjamin, [id number], on 10/30/21 in a manner and location that ensures patient privacy to discuss participation in the above listed research study.  Patient is Accompanied by her husband and daughter .  A copy of the informed consent document and separate HIPAA Authorization was provided to the patient.  Patient reads, speaks, and understands [place].   Patient was provided with the business card of this Nurse and encouraged to contact the research team with any questions.  Approximately 10 minutes were spent with the patient reviewing the informed consent documents.  Patient was provided the option of taking informed consent documents home to review and was encouraged to review at their convenience with their support network, including other care providers. Patient took the consent documents home to review. Susan Skiff Harol Shabazz, RN, BSN, [facility] She   Her   Hers Clinical Research Nurse [facility] Direct Dial [phone]   Pager [phone] 10/30/2021 4:49 PM

## 2021-10-31 ENCOUNTER — Encounter: Payer: Self-pay | Admitting: Hematology and Oncology

## 2021-10-31 ENCOUNTER — Encounter: Payer: Self-pay | Admitting: *Deleted

## 2021-10-31 ENCOUNTER — Ambulatory Visit: Payer: BC Managed Care – PPO

## 2021-10-31 NOTE — Research (Signed)
[id number] - TREATMENT OF REFRACTORY NAUSEA  This Nurse has reviewed this patient's inclusion and exclusion criteria as a second review and confirms Susan Benjamin is eligible for study participation.  Patient may continue with enrollment.  Foye Spurling, BSN, RN, [place] Nurse II 10/31/2021

## 2021-10-31 NOTE — Progress Notes (Signed)
This encounter was created in error - please disregard.

## 2021-11-02 ENCOUNTER — Ambulatory Visit: Payer: BC Managed Care – PPO | Admitting: Radiation Oncology

## 2021-11-02 ENCOUNTER — Other Ambulatory Visit (HOSPITAL_COMMUNITY): Payer: Self-pay

## 2021-11-02 ENCOUNTER — Other Ambulatory Visit: Payer: Self-pay

## 2021-11-02 ENCOUNTER — Ambulatory Visit: Admission: RE | Admit: 2021-11-02 | Payer: BC Managed Care – PPO | Source: Ambulatory Visit

## 2021-11-02 ENCOUNTER — Encounter: Payer: Self-pay | Admitting: Hematology and Oncology

## 2021-11-02 DIAGNOSIS — C50411 Malignant neoplasm of upper-outer quadrant of right female breast: Secondary | ICD-10-CM

## 2021-11-02 MED ORDER — MAGIC MOUTHWASH W/LIDOCAINE: 5.0000 mL | Freq: Four times a day (QID) | ORAL | 1 refills | Status: DC | PRN

## 2021-11-02 MED ORDER — ONDANSETRON HCL 8 MG PO TABS: 8.0000 mg | ORAL_TABLET | Freq: Once | ORAL | Status: DC

## 2021-11-02 MED ORDER — NYSTATIN 100000 UNIT/ML MT SUSP
Freq: Two times a day (BID) | OROMUCOSAL | 1 refills | Status: DC
  Filled 2021-11-02: qty 240, 12d supply, fill #0

## 2021-11-02 MED ORDER — ONDANSETRON 8 MG PO TBDP
8.0000 mg | ORAL_TABLET | Freq: Once | ORAL | Status: DC
  Filled 2021-11-02: qty 1

## 2021-11-02 NOTE — Progress Notes (Signed)
Pt called and requests rx for Zofran be changed to ODT. Called [facility] to make them aware of change. Pt also requested rx for magic mouthwash to have on hand in case of mouth sores. Rx called in to [facility].  ? ?Pt also requests rx for compression hose [facility]. Called them and requested an order requisition be faxed to [place] for [place] to sign. Pt is aware of all and verbalized thanks.  ?

## 2021-11-02 NOTE — Addendum Note (Signed)
Addended by: Dicie Beam D on: 11/02/2021 11:01 AM ? ? Modules accepted: Orders ? ?

## 2021-11-03 ENCOUNTER — Other Ambulatory Visit (HOSPITAL_COMMUNITY): Payer: Self-pay

## 2021-11-03 ENCOUNTER — Encounter: Payer: Self-pay | Admitting: Hematology and Oncology

## 2021-11-03 MED FILL — Dexamethasone Sodium Phosphate Inj 100 MG/10ML: INTRAMUSCULAR | Qty: 1 | Status: AC

## 2021-11-06 ENCOUNTER — Inpatient Hospital Stay: Payer: BC Managed Care – PPO

## 2021-11-06 ENCOUNTER — Other Ambulatory Visit: Payer: Self-pay

## 2021-11-06 ENCOUNTER — Inpatient Hospital Stay: Payer: BC Managed Care – PPO | Attending: Hematology and Oncology

## 2021-11-06 VITALS — BP 138/74 | HR 78 | Temp 98.1°F | Resp 18 | Ht 64.0 in | Wt 163.4 lb

## 2021-11-06 DIAGNOSIS — Z95828 Presence of other vascular implants and grafts: Secondary | ICD-10-CM

## 2021-11-06 DIAGNOSIS — Z803 Family history of malignant neoplasm of breast: Secondary | ICD-10-CM | POA: Insufficient documentation

## 2021-11-06 DIAGNOSIS — C50411 Malignant neoplasm of upper-outer quadrant of right female breast: Secondary | ICD-10-CM

## 2021-11-06 DIAGNOSIS — Z5111 Encounter for antineoplastic chemotherapy: Secondary | ICD-10-CM | POA: Insufficient documentation

## 2021-11-06 DIAGNOSIS — Z170 Estrogen receptor positive status [ER+]: Secondary | ICD-10-CM

## 2021-11-06 DIAGNOSIS — Z5189 Encounter for other specified aftercare: Secondary | ICD-10-CM | POA: Insufficient documentation

## 2021-11-06 LAB — CBC WITH DIFFERENTIAL/PLATELET
Abs Immature Granulocytes: 0.05 10*3/uL (ref 0.00–0.07)
Basophils Absolute: 0 10*3/uL (ref 0.0–0.1)
Basophils Relative: 0 %
Eosinophils Absolute: 0 10*3/uL (ref 0.0–0.5)
Eosinophils Relative: 0 %
HCT: 37.3 % (ref 36.0–46.0)
Hemoglobin: 12.9 g/dL (ref 12.0–15.0)
Immature Granulocytes: 0 %
Lymphocytes Relative: 9 %
Lymphs Abs: 1 10*3/uL (ref 0.7–4.0)
MCH: 30.1 pg (ref 26.0–34.0)
MCHC: 34.6 g/dL (ref 30.0–36.0)
MCV: 87.1 fL (ref 80.0–100.0)
Monocytes Absolute: 0.3 10*3/uL (ref 0.1–1.0)
Monocytes Relative: 2 %
Neutro Abs: 10.4 10*3/uL — ABNORMAL HIGH (ref 1.7–7.7)
Neutrophils Relative %: 89 %
Platelets: 289 10*3/uL (ref 150–400)
RBC: 4.28 MIL/uL (ref 3.87–5.11)
RDW: 12.5 % (ref 11.5–15.5)
WBC: 11.7 10*3/uL — ABNORMAL HIGH (ref 4.0–10.5)
nRBC: 0 % (ref 0.0–0.2)

## 2021-11-06 LAB — COMPREHENSIVE METABOLIC PANEL
ALT: 12 U/L (ref 0–44)
AST: 14 U/L — ABNORMAL LOW (ref 15–41)
Albumin: 4.4 g/dL (ref 3.5–5.0)
Alkaline Phosphatase: 65 U/L (ref 38–126)
Anion gap: 8 (ref 5–15)
BUN: 19 mg/dL (ref 8–23)
CO2: 24 mmol/L (ref 22–32)
Calcium: 9.7 mg/dL (ref 8.9–10.3)
Chloride: 105 mmol/L (ref 98–111)
Creatinine, Ser: 0.78 mg/dL (ref 0.44–1.00)
GFR, Estimated: >60 mL/min (ref >60)
Glucose, Bld: 154 mg/dL — ABNORMAL HIGH (ref 70–99)
Potassium: 3.8 mmol/L (ref 3.5–5.1)
Sodium: 137 mmol/L (ref 135–145)
Total Bilirubin: 0.4 mg/dL (ref 0.3–1.2)
Total Protein: 7.4 g/dL (ref 6.5–8.1)

## 2021-11-06 LAB — RESEARCH LABS

## 2021-11-06 MED ORDER — SODIUM CHLORIDE 0.9% FLUSH
10.0000 mL | INTRAVENOUS | Status: DC | PRN
  Administered 2021-11-06: 10 mL

## 2021-11-06 MED ORDER — SODIUM CHLORIDE 0.9 % IV SOLN
75.0000 mg/m2 | Freq: Once | INTRAVENOUS | Status: AC
  Administered 2021-11-06: 140 mg via INTRAVENOUS
  Filled 2021-11-06: qty 14

## 2021-11-06 MED ORDER — SODIUM CHLORIDE 0.9 % IV SOLN
600.0000 mg/m2 | Freq: Once | INTRAVENOUS | Status: AC
  Administered 2021-11-06: 1100 mg via INTRAVENOUS
  Filled 2021-11-06: qty 55

## 2021-11-06 MED ORDER — SODIUM CHLORIDE 0.9% FLUSH
10.0000 mL | INTRAVENOUS | Status: DC | PRN
  Administered 2021-11-06: 10 mL via INTRAVENOUS

## 2021-11-06 MED ORDER — SODIUM CHLORIDE 0.9 % IV SOLN: Freq: Once | INTRAVENOUS | Status: AC

## 2021-11-06 MED ORDER — PALONOSETRON HCL INJECTION 0.25 MG/5ML
0.2500 mg | Freq: Once | INTRAVENOUS | Status: AC
  Administered 2021-11-06: 0.25 mg via INTRAVENOUS
  Filled 2021-11-06: qty 5

## 2021-11-06 MED ORDER — SODIUM CHLORIDE 0.9 % IV SOLN
10.0000 mg | Freq: Once | INTRAVENOUS | Status: AC
  Administered 2021-11-06: 10 mg via INTRAVENOUS
  Filled 2021-11-06: qty 10

## 2021-11-06 NOTE — Research (Signed)
[id number] - TREATMENT OF REFRACTORY NAUSEA ? ?Met with patient in infusion. She completed the baseline forms but left them at home; she will bring them to me Wednesday when she comes to clinic. ? ?Reviewed Cycle 1 forms with her and made plan for Research Team to call her Thursday afternoon for her C1D4 call. Patient has no further questions now. ? ?Marjie Skiff Gissel Keilman, RN, BSN, CHPN ?She  Her  Hers ?Clinical Research Nurse ?[place] ?Direct Dial [phone]  Pager [phone] ?11/06/2021 12:16 PM` ?

## 2021-11-06 NOTE — Research (Signed)
Trial: DCP-001: Use of a Clinical Trial Screening Tool to Address Cancer Health Disparities in the [place] Program [facility]) ? ?Patient Susan Benjamin was identified by this RN as a potential candidate for the above listed study.  This Clinical Research Nurse met with SURYA FOLDEN, [id number], on 11/06/21 in a manner and location that ensures patient privacy to discuss participation in the above listed research study.  Patient is Unaccompanied.  A copy of the informed consent document and separate HIPAA Authorization was provided to the patient.  Patient reads, speaks, and understands [place].   ? ?Patient was provided with the business card of this Nurse and encouraged to contact the research team with any questions.  Patient was provided the option of taking informed consent documents home to review and was encouraged to review at their convenience with their support network, including other care providers. Patient is comfortable with making a decision regarding study participation today. ? ?As outlined in the informed consent form, this Nurse and Sherrie Sport discussed the purpose of the research study, the investigational nature of the study, study procedures and requirements for study participation, potential risks and benefits of study participation, as well as alternatives to participation. This study is not blinded. The patient understands participation is voluntary and they may withdraw from study participation at any time.  This study does not involve randomization.  This study does not involve an investigational drug or device. This study does not involve a placebo. Patient understands enrollment is pending full eligibility review.  ? ?Confidentiality and how the patient's information will be used as part of study participation were discussed.  Patient was informed there is not reimbursement provided for their time and effort spent on trial participation.  The patient is  encouraged to discuss research study participation with their insurance provider to determine what costs they may incur as part of study participation, including research related injury.   ? ?All questions were answered to patient's satisfaction.  The informed consent and separate HIPAA Authorization was reviewed page by page.  The patient's mental and emotional status is appropriate to provide informed consent, and the patient verbalizes an understanding of study participation.  Patient has agreed to participate in the above listed research study and has voluntarily signed the informed consent [place] Active Date 09/28/21 and separate HIPAA Authorization, version 5  on 11/06/21 at 1157AM.  The patient was provided with a copy of the signed informed consent form and separate HIPAA Authorization for their reference.  No study specific procedures were obtained prior to the signing of the informed consent document.  Approximately 10 minutes were spent with the patient reviewing the informed consent documents.   ? ?Marjie Skiff Jaritza Duignan, RN, BSN, CHPN ?She  Her  Hers ?Clinical Research Nurse ?[place] ?Direct Dial [phone]  Pager [phone] ?11/06/2021 12:14 PM ?

## 2021-11-06 NOTE — Patient Instructions (Signed)
[facility]  Discharge Instructions: ?Thank you for choosing [facility] to provide your oncology and hematology care.  ? ?If you have a lab appointment with the [facility], please go directly to the [facility] and check in at the registration area. ?  ?Wear comfortable clothing and clothing appropriate for easy access to any Portacath or PICC line.  ? ?We strive to give you quality time with your provider. You may need to reschedule your appointment if you arrive late (15 or more minutes).  Arriving late affects you and other patients whose appointments are after yours.  Also, if you miss three or more appointments without notifying the office, you may be dismissed from the clinic at the provider?s discretion.    ?  ?For prescription refill requests, have your pharmacy contact our office and allow 72 hours for refills to be completed.   ? ?Today you received the following chemotherapy and/or immunotherapy agents docetaxel, cytoxan    ?  ?To help prevent nausea and vomiting after your treatment, we encourage you to take your nausea medication as directed. ? ?BELOW ARE SYMPTOMS THAT SHOULD BE REPORTED IMMEDIATELY: ?*FEVER GREATER THAN 100.4 F (38 ?C) OR HIGHER ?*CHILLS OR SWEATING ?*NAUSEA AND VOMITING THAT IS NOT CONTROLLED WITH YOUR NAUSEA MEDICATION ?*UNUSUAL SHORTNESS OF BREATH ?*UNUSUAL BRUISING OR BLEEDING ?*URINARY PROBLEMS (pain or burning when urinating, or frequent urination) ?*BOWEL PROBLEMS (unusual diarrhea, constipation, pain near the anus) ?TENDERNESS IN MOUTH AND THROAT WITH OR WITHOUT PRESENCE OF ULCERS (sore throat, sores in mouth, or a toothache) ?UNUSUAL RASH, SWELLING OR PAIN  ?UNUSUAL VAGINAL DISCHARGE OR ITCHING  ? ?Items with * indicate a potential emergency and should be followed up as soon as possible or go to the Emergency Department if any problems should occur. ? ?Please show the CHEMOTHERAPY ALERT CARD or IMMUNOTHERAPY ALERT CARD at  check-in to the Emergency Department and triage nurse. ? ?Should you have questions after your visit or need to cancel or reschedule your appointment, please contact [facility]  Dept: [phone]  and follow the prompts.  Office hours are 8:00 a.m. to 4:30 p.m. Monday - Friday. Please note that voicemails left after 4:00 p.m. may not be returned until the following business day.  We are closed weekends and major holidays. You have access to a nurse at all times for urgent questions. Please call the main number to the clinic Dept: [phone] and follow the prompts. ? ? ?For any non-urgent questions, you may also contact your provider using MyChart. We now offer e-Visits for anyone 30 and older to request care online for non-urgent symptoms. For details visit [email or web address]. ?  ?Also download the MyChart app! Go to the app store, search "MyChart", open the app, select [facility], and log in with your MyChart username and password. ? ?Due to Covid, a mask is required upon entering the hospital/clinic. If you do not have a mask, one will be given to you upon arrival. For doctor visits, patients may have 1 support person aged 25 or older with them. For treatment visits, patients cannot have anyone with them due to current Covid guidelines and our immunocompromised population.  ? ?

## 2021-11-07 ENCOUNTER — Telehealth: Payer: Self-pay | Admitting: *Deleted

## 2021-11-07 NOTE — Telephone Encounter (Signed)
Called pt to see how she did with her treatment yesterday.  She reports doing well except eyes a little hazy at times & red face.  Explained red face may be from steroids.  Sometimes drugs can effects eyes but hopefully will settle down. Not worrisome for now.  She reports knowing how to reach [place] if needed.   ?

## 2021-11-07 NOTE — Telephone Encounter (Signed)
-----   Message from Gillian Shields, RN sent at 11/06/2021  4:43 PM EST ----- ?Regarding: F/U first time TC. [place]. ?First time docetaxel, cytoxan on 11/06/21.  Pt tolerated well. Pt daughter is a Marine scientist at [employer]. ? ?

## 2021-11-08 ENCOUNTER — Other Ambulatory Visit: Payer: Self-pay

## 2021-11-08 ENCOUNTER — Inpatient Hospital Stay: Payer: BC Managed Care – PPO

## 2021-11-08 VITALS — BP 147/79 | HR 64 | Temp 98.3°F | Resp 18

## 2021-11-08 DIAGNOSIS — Z5111 Encounter for antineoplastic chemotherapy: Secondary | ICD-10-CM | POA: Diagnosis not present

## 2021-11-08 DIAGNOSIS — C50411 Malignant neoplasm of upper-outer quadrant of right female breast: Secondary | ICD-10-CM

## 2021-11-08 DIAGNOSIS — Z170 Estrogen receptor positive status [ER+]: Secondary | ICD-10-CM

## 2021-11-08 MED ORDER — PEGFILGRASTIM-CBQV 6 MG/0.6ML ~~LOC~~ SOSY
6.0000 mg | PREFILLED_SYRINGE | Freq: Once | SUBCUTANEOUS | Status: AC
  Administered 2021-11-08: 6 mg via SUBCUTANEOUS
  Filled 2021-11-08: qty 0.6

## 2021-11-08 NOTE — Patient Instructions (Signed)

## 2021-11-09 ENCOUNTER — Encounter: Payer: Self-pay | Admitting: *Deleted

## 2021-11-09 DIAGNOSIS — C50411 Malignant neoplasm of upper-outer quadrant of right female breast: Secondary | ICD-10-CM

## 2021-11-09 NOTE — Research (Signed)
[id number] - TREATMENT OF REFRACTORY NAUSEA ? ?Called Mrs Tomasini for her C1D4 call. She reports "feeling great" since chemo, and has had no nausea or vomiting. She reports not using any of her prn home anti-emetics. ? ?She had no AEs. ? ?Due to absence of vomiting and nausea, she is ineligible to continue on the study. Reminded her to mail back completed questionnaires in the provided SASE. Also reminded her to call me if there is any way the Research team can assist her in the future. ? ?Marjie Skiff Domique Clapper, RN, BSN, CHPN ?She  Her  Hers ?Clinical Research Nurse ?[facility] ?Direct Dial [phone]  Pager [phone] ?11/09/2021 12:56 PM ?

## 2021-11-15 DIAGNOSIS — C50411 Malignant neoplasm of upper-outer quadrant of right female breast: Secondary | ICD-10-CM

## 2021-11-15 NOTE — Research (Signed)
[id number] - TREATMENT OF REFRACTORY NAUSEA ? ?Final surveys/PROs were rec'd yesterday. Patient has completed study. ? ?Marjie Skiff Jehan Ranganathan, RN, BSN, CHPN ?She  Her  Hers ?Clinical Research Nurse ?[facility] ?Direct Dial [phone]  Pager [phone] ?11/15/2021 8:59 AM ?

## 2021-11-16 ENCOUNTER — Encounter (HOSPITAL_COMMUNITY): Payer: Self-pay

## 2021-11-21 NOTE — Research (Signed)
LATE ENTRY: ? ?DCP-001: Use of a Clinical Trial Screening Tool to Address Cancer Health Disparities in the [place] Program [facility]) ? ? ?This Nurse has reviewed this patient's inclusion and exclusion criteria and confirmed Susan Benjamin is eligible for study participation.  Patient will continue with enrollment. ? ?Investigator agrees with enrollment. ? ?Marjie Skiff. Aylssa Herrig, RN, BSN, CHPN ?She  Her  Hers ?Clinical Research Nurse ?[facility] ?Direct Dial [phone]  Pager [phone] ?11/21/2021 4:22 PM ?

## 2021-11-27 NOTE — Assessment & Plan Note (Addendum)
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  ? ?1. Stage IIA right breast cancer: She will continue on adjuvant chemotherapy given on day 1 of a 21 day cycle with Neulasta (or biosimilar) given on day 3.  Her labs are normal today and she will proceed with treatment.   ? ?2. Chemo toxicities: mild fatigue on days 5-7, monitoring closely for peripheral neuropathy.   ? ?[name] is tolerating her treatment well and will proceed with it.   ?

## 2021-11-27 NOTE — Progress Notes (Signed)
[facility] Follow up: ?  ? ?Susan Dus, MD ?[address] ?[place] [address] ? ? ?DIAGNOSIS:  Cancer Staging  ?Malignant neoplasm of upper-outer quadrant of right breast in female, estrogen receptor positive ([place]) ?Staging form: Breast, AJCC 8th Edition ?- Clinical: Stage IB (cT1c, cN0, cM0, G3, ER+, PR-, HER2-) - Unsigned ?Stage prefix: Initial diagnosis ?Method of lymph node assessment: Clinical ?Histologic grading system: 3 grade system ?- Pathologic: Stage IIA (pT2, pN0, cM0, G3, ER+, PR-, HER2-) - Signed by Susan Phlegm, NP on 11/27/2021 ?Histologic grading system: 3 grade system ? ? ?SUMMARY OF ONCOLOGIC HISTORY: ?Oncology History  ?Malignant neoplasm of upper-outer quadrant of right breast in female, estrogen receptor positive ([place])  ?08/08/2021 Imaging  ? August 08, 2021, patient had bilateral screening mammogram which showed 2 possible masses in the right breast which warrant further evaluation.  No concerning findings in the left breast. ?She had diagnostic mammogram on 09/13/2021 which noted possible mass in the upper central breast at middle depth which persists on additional views is 1.9 cm oval mass with indistinct margins.  Second previously noted possible mass in the upper outer breast at anterior to middle depth dissipates on additional views consistent with overlapping fibroglandular tissue. ?Ultrasound showed suspicious 1.9 cm mass at the right breast 12 o'clock position for which ultrasound-guided biopsy was recommended no right axillary lymphadenopathy. ?  ?09/25/2021 Initial Diagnosis  ? Malignant neoplasm of upper-outer quadrant of right breast in female, estrogen receptor positive ([place]) ?  ? Genetic Testing  ? [facility] CancerNext-Expanded Panel was Negative. 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 Report date is 10/20/2021. ? ?The CancerNext-Expanded gene panel offered by [facility] and includes  sequencing, rearrangement, and RNA analysis for the following 77 genes: AIP, ALK, APC, ATM, AXIN2, BAP1, BARD1, BLM, BMPR1A, BRCA1, BRCA2, BRIP1, CDC73, CDH1, CDK4, CDKN1B, CDKN2A, CHEK2, CTNNA1, DICER1, FANCC, FH, FLCN, GALNT12, KIF1B, LZTR1, MAX, MEN1, MET, MLH1, MSH2, MSH3, MSH6, MUTYH, NBN, NF1, NF2, NTHL1, PALB2, PHOX2B, PMS2, POT1, PRKAR1A, PTCH1, PTEN, RAD51C, RAD51D, RB1, RECQL, RET, SDHA, SDHAF2, SDHB, SDHC, SDHD, SMAD4, SMARCA4, SMARCB1, SMARCE1, STK11, SUFU, TMEM127, TP53, TSC1, TSC2, VHL and XRCC2 (sequencing and deletion/duplication); EGFR, EGLN1, HOXB13, KIT, MITF, PDGFRA, POLD1, and POLE (sequencing only); EPCAM and GREM1 (deletion/duplication only).  ?  ?10/06/2021 Surgery  ? A. BREAST, RIGHT, LUMPECTOMY:  ?-  Invasive carcinoma of no special type (ductal), grade3 (total  ?Nottingham score 9), 25 mm in greatest dimension.  ?-  Ductal carcinoma in situ, grade III (high), 0.3 cm in greatest  ?dimension.  ?Prognostics from initial biopsy showed ER +30%, weak staining, PR 0%, negative, Ki-67 of 90%.  HER2 negative by IHC. ? ? ?  ?11/06/2021 -  Chemotherapy  ? Patient is on Treatment Plan : BREAST TC q21d  ?   ?11/27/2021 Cancer Staging  ? Staging form: Breast, AJCC 8th Edition ?- Pathologic: Stage IIA (pT2, pN0, cM0, G3, ER+, PR-, HER2-) - Signed by Susan Phlegm, NP on 11/27/2021 ?Histologic grading system: 3 grade system ? ?  ? ? ?CURRENT THERAPY: Taxotere/Cytoxan ? ?INTERVAL HISTORY: ?Susan Benjamin 66 y.o. female returns for evaluation prior to receiving her second cycle of taxotere and cytoxan.  She is tolerating her treatment well.  She has no issues with peripheral neuropathy, skin concerns, or swelling.  She denies any new concerns today.   ? ? ?Patient Active Problem List  ? Diagnosis Date Noted  ?  Genetic testing 10/09/2021  ? Malignant neoplasm of upper-outer quadrant of right breast in female, estrogen receptor positive ([place]) 09/25/2021  ? Benign colon polyp 10/10/2012  ? ? ?has No  Known Allergies. ? ?MEDICAL HISTORY: ?Past Medical History:  ?Diagnosis Date  ? Anxiety   ? CIN I (cervical intraepithelial neoplasia I)   ? Ectopic pregnancy   ? X 2  ? Endometrial polyp   ? PONV (postoperative nausea and vomiting)   ? Seasonal allergies   ? ? ?SURGICAL HISTORY: ?Past Surgical History:  ?Procedure Laterality Date  ? BREAST LUMPECTOMY WITH RADIOACTIVE SEED AND SENTINEL LYMPH NODE BIOPSY Right 10/06/2021  ? Procedure: RIGHT BREAST LUMPECTOMY WITH RADIOACTIVE SEED AND SENTINEL LYMPH NODE BIOPSY;  Surgeon: Donnie Mesa, MD;  Location: [facility];  Service: General;  Laterality: Right;  ? CARPAL TUNNEL RELEASE    ? CERVICAL BIOPSY  W/ LOOP ELECTRODE EXCISION  2003  ? DILATION AND CURETTAGE OF UTERUS  2011  ? ECTOPIC PREGNANCY SURGERY    ? X 2-Right and Left salpingectomy  ? FOOT SURGERY    ? HYSTEROSCOPY  2011  ? endometrial polyp  ? PORTACATH PLACEMENT Right 10/06/2021  ? Procedure: INSERTION PORT-A-CATH;  Surgeon: Donnie Mesa, MD;  Location: [facility];  Service: General;  Laterality: Right;  ? TUBAL LIGATION    ? tubal lig and tubal reversal  ? VAGINAL HYSTERECTOMY  2012  ? ? ?SOCIAL HISTORY: ?Social History  ? ?Socioeconomic History  ? Marital status: Married  ?  Spouse name: Not on file  ? Number of children: Not on file  ? Years of education: Not on file  ? Highest education level: Not on file  ?Occupational History  ? Not on file  ?Tobacco Use  ? Smoking status: Never  ? Smokeless tobacco: Never  ?Vaping Use  ? Vaping Use: Never used  ?Substance and Sexual Activity  ? Alcohol use: Yes  ?  Alcohol/week: 0.0 standard drinks  ?  Comment: rarely  ? Drug use: No  ? Sexual activity: Yes  ?  Birth control/protection: Surgical  ?  Comment: INTERCOURSE AGE 63, SEXUAL PARTNERS LESS THAN 5  ?Other Topics Concern  ? Not on file  ?Social History Narrative  ? Not on file  ? ?Social Determinants of Health  ? ?Financial Resource Strain: Not on file  ?Food Insecurity: Not on file   ?Transportation Needs: Not on file  ?Physical Activity: Not on file  ?Stress: Not on file  ?Social Connections: Not on file  ?Intimate Partner Violence: Not on file  ? ? ?FAMILY HISTORY: ?Family History  ?Problem Relation Age of Onset  ? Diabetes Mother   ? Hypertension Mother   ? Stroke Mother   ? Heart disease Mother   ? Hypertension Father   ? Melanoma Father 48  ?     metastasized  ? Breast cancer Cousin   ?     dx. 11s  ? ? ?Review of Systems  ?Constitutional:  Positive for fatigue (mild). Negative for appetite change, chills, fever and unexpected weight change.  ?HENT:   Negative for hearing loss, lump/mass and trouble swallowing.   ?Eyes:  Negative for eye problems and icterus.  ?Respiratory:  Negative for chest tightness, cough and shortness of breath.   ?Cardiovascular:  Negative for chest pain, leg swelling and palpitations.  ?Gastrointestinal:  Negative for abdominal distention, abdominal pain, constipation, diarrhea, nausea and vomiting.  ?Endocrine: Negative for hot flashes.  ?Genitourinary:  Negative for difficulty  urinating.   ?Musculoskeletal:  Negative for arthralgias.  ?Skin:  Negative for itching and rash.  ?Neurological:  Negative for dizziness, extremity weakness, headaches and numbness.  ?Hematological:  Negative for adenopathy. Does not bruise/bleed easily.  ?Psychiatric/Behavioral:  Negative for depression. The patient is not nervous/anxious.    ? ? ?PHYSICAL EXAMINATION ? ?ECOG PERFORMANCE STATUS: 1 - Symptomatic but completely ambulatory ? ?Vitals:  ? 11/28/21 0935  ?BP: 139/75  ?Pulse: 92  ?Resp: 18  ?Temp: 97.7 ?F (36.5 ?C)  ?SpO2: 97%  ? ? ?Physical Exam ?Constitutional:   ?   General: She is not in acute distress. ?   Appearance: Normal appearance. She is not toxic-appearing.  ?HENT:  ?   Head: Normocephalic and atraumatic.  ?Eyes:  ?   General: No scleral icterus. ?Cardiovascular:  ?   Rate and Rhythm: Normal rate and regular rhythm.  ?   Pulses: Normal pulses.  ?   Heart sounds:  Normal heart sounds.  ?Pulmonary:  ?   Effort: Pulmonary effort is normal.  ?   Breath sounds: Normal breath sounds.  ?Abdominal:  ?   General: Abdomen is flat. Bowel sounds are normal. There is no distension.

## 2021-11-28 ENCOUNTER — Other Ambulatory Visit: Payer: Self-pay | Admitting: Hematology and Oncology

## 2021-11-28 ENCOUNTER — Inpatient Hospital Stay: Payer: BC Managed Care – PPO

## 2021-11-28 ENCOUNTER — Encounter: Payer: Self-pay | Admitting: Adult Health

## 2021-11-28 ENCOUNTER — Inpatient Hospital Stay (HOSPITAL_BASED_OUTPATIENT_CLINIC_OR_DEPARTMENT_OTHER): Payer: BC Managed Care – PPO | Admitting: Adult Health

## 2021-11-28 ENCOUNTER — Other Ambulatory Visit: Payer: Self-pay

## 2021-11-28 ENCOUNTER — Other Ambulatory Visit: Payer: BC Managed Care – PPO

## 2021-11-28 VITALS — BP 139/75 | HR 92 | Temp 97.7°F | Resp 18 | Ht 64.0 in | Wt 165.8 lb

## 2021-11-28 DIAGNOSIS — C50411 Malignant neoplasm of upper-outer quadrant of right female breast: Secondary | ICD-10-CM

## 2021-11-28 DIAGNOSIS — Z5111 Encounter for antineoplastic chemotherapy: Secondary | ICD-10-CM | POA: Diagnosis not present

## 2021-11-28 DIAGNOSIS — Z170 Estrogen receptor positive status [ER+]: Secondary | ICD-10-CM | POA: Diagnosis not present

## 2021-11-28 DIAGNOSIS — Z95828 Presence of other vascular implants and grafts: Secondary | ICD-10-CM

## 2021-11-28 LAB — CBC WITH DIFFERENTIAL (CANCER CENTER ONLY)
Abs Immature Granulocytes: 0.03 10*3/uL (ref 0.00–0.07)
Basophils Absolute: 0 10*3/uL (ref 0.0–0.1)
Basophils Relative: 0 %
Eosinophils Absolute: 0 10*3/uL (ref 0.0–0.5)
Eosinophils Relative: 0 %
HCT: 33.8 % — ABNORMAL LOW (ref 36.0–46.0)
Hemoglobin: 11.9 g/dL — ABNORMAL LOW (ref 12.0–15.0)
Immature Granulocytes: 0 %
Lymphocytes Relative: 9 %
Lymphs Abs: 0.9 10*3/uL (ref 0.7–4.0)
MCH: 31.3 pg (ref 26.0–34.0)
MCHC: 35.2 g/dL (ref 30.0–36.0)
MCV: 88.9 fL (ref 80.0–100.0)
Monocytes Absolute: 0.3 10*3/uL (ref 0.1–1.0)
Monocytes Relative: 4 %
Neutro Abs: 8.1 10*3/uL — ABNORMAL HIGH (ref 1.7–7.7)
Neutrophils Relative %: 87 %
Platelet Count: 430 10*3/uL — ABNORMAL HIGH (ref 150–400)
RBC: 3.8 MIL/uL — ABNORMAL LOW (ref 3.87–5.11)
RDW: 13.4 % (ref 11.5–15.5)
WBC Count: 9.3 10*3/uL (ref 4.0–10.5)
nRBC: 0 % (ref 0.0–0.2)

## 2021-11-28 LAB — CMP (CANCER CENTER ONLY)
ALT: 13 U/L (ref 0–44)
AST: 14 U/L — ABNORMAL LOW (ref 15–41)
Albumin: 4 g/dL (ref 3.5–5.0)
Alkaline Phosphatase: 62 U/L (ref 38–126)
Anion gap: 11 (ref 5–15)
BUN: 18 mg/dL (ref 8–23)
CO2: 23 mmol/L (ref 22–32)
Calcium: 9.1 mg/dL (ref 8.9–10.3)
Chloride: 103 mmol/L (ref 98–111)
Creatinine: 0.77 mg/dL (ref 0.44–1.00)
GFR, Estimated: >60 mL/min (ref >60)
Glucose, Bld: 213 mg/dL — ABNORMAL HIGH (ref 70–99)
Potassium: 3.7 mmol/L (ref 3.5–5.1)
Sodium: 137 mmol/L (ref 135–145)
Total Bilirubin: 0.4 mg/dL (ref 0.3–1.2)
Total Protein: 6.7 g/dL (ref 6.5–8.1)

## 2021-11-28 MED ORDER — SODIUM CHLORIDE 0.9 % IV SOLN
10.0000 mg | Freq: Once | INTRAVENOUS | Status: AC
  Administered 2021-11-28: 10 mg via INTRAVENOUS
  Filled 2021-11-28: qty 10

## 2021-11-28 MED ORDER — PALONOSETRON HCL INJECTION 0.25 MG/5ML
0.2500 mg | Freq: Once | INTRAVENOUS | Status: AC
  Administered 2021-11-28: 0.25 mg via INTRAVENOUS
  Filled 2021-11-28: qty 5

## 2021-11-28 MED ORDER — SODIUM CHLORIDE 0.9 % IV SOLN
75.0000 mg/m2 | Freq: Once | INTRAVENOUS | Status: AC
  Administered 2021-11-28: 140 mg via INTRAVENOUS
  Filled 2021-11-28: qty 14

## 2021-11-28 MED ORDER — SODIUM CHLORIDE 0.9 % IV SOLN: Freq: Once | INTRAVENOUS | Status: AC

## 2021-11-28 MED ORDER — SODIUM CHLORIDE 0.9% FLUSH
10.0000 mL | INTRAVENOUS | Status: AC | PRN
  Administered 2021-11-28: 10 mL

## 2021-11-28 MED ORDER — SODIUM CHLORIDE 0.9 % IV SOLN
600.0000 mg/m2 | Freq: Once | INTRAVENOUS | Status: AC
  Administered 2021-11-28: 1100 mg via INTRAVENOUS
  Filled 2021-11-28: qty 55

## 2021-11-28 NOTE — Patient Instructions (Signed)
[facility]  Discharge Instructions: ?Thank you for choosing [facility] to provide your oncology and hematology care.  ? ?If you have a lab appointment with the [facility], please go directly to the [facility] and check in at the registration area. ?  ?Wear comfortable clothing and clothing appropriate for easy access to any Portacath or PICC line.  ? ?We strive to give you quality time with your provider. You may need to reschedule your appointment if you arrive late (15 or more minutes).  Arriving late affects you and other patients whose appointments are after yours.  Also, if you miss three or more appointments without notifying the office, you may be dismissed from the clinic at the provider?s discretion.    ?  ?For prescription refill requests, have your pharmacy contact our office and allow 72 hours for refills to be completed.   ? ?Today you received the following chemotherapy and/or immunotherapy agents docetaxel, cytoxan    ?  ?To help prevent nausea and vomiting after your treatment, we encourage you to take your nausea medication as directed. ? ?BELOW ARE SYMPTOMS THAT SHOULD BE REPORTED IMMEDIATELY: ?*FEVER GREATER THAN 100.4 F (38 ?C) OR HIGHER ?*CHILLS OR SWEATING ?*NAUSEA AND VOMITING THAT IS NOT CONTROLLED WITH YOUR NAUSEA MEDICATION ?*UNUSUAL SHORTNESS OF BREATH ?*UNUSUAL BRUISING OR BLEEDING ?*URINARY PROBLEMS (pain or burning when urinating, or frequent urination) ?*BOWEL PROBLEMS (unusual diarrhea, constipation, pain near the anus) ?TENDERNESS IN MOUTH AND THROAT WITH OR WITHOUT PRESENCE OF ULCERS (sore throat, sores in mouth, or a toothache) ?UNUSUAL RASH, SWELLING OR PAIN  ?UNUSUAL VAGINAL DISCHARGE OR ITCHING  ? ?Items with * indicate a potential emergency and should be followed up as soon as possible or go to the Emergency Department if any problems should occur. ? ?Please show the CHEMOTHERAPY ALERT CARD or IMMUNOTHERAPY ALERT CARD at  check-in to the Emergency Department and triage nurse. ? ?Should you have questions after your visit or need to cancel or reschedule your appointment, please contact [facility]  Dept: [phone]  and follow the prompts.  Office hours are 8:00 a.m. to 4:30 p.m. Monday - Friday. Please note that voicemails left after 4:00 p.m. may not be returned until the following business day.  We are closed weekends and major holidays. You have access to a nurse at all times for urgent questions. Please call the main number to the clinic Dept: [phone] and follow the prompts. ? ? ?For any non-urgent questions, you may also contact your provider using MyChart. We now offer e-Visits for anyone 66 and older to request care online for non-urgent symptoms. For details visit [email or web address]. ?  ?Also download the MyChart app! Go to the app store, search "MyChart", open the app, select [facility], and log in with your MyChart username and password. ? ?Due to Covid, a mask is required upon entering the hospital/clinic. If you do not have a mask, one will be given to you upon arrival. For doctor visits, patients may have 1 support person aged 32 or older with them. For treatment visits, patients cannot have anyone with them due to current Covid guidelines and our immunocompromised population.  ? ?

## 2021-11-29 ENCOUNTER — Other Ambulatory Visit: Payer: Self-pay | Admitting: *Deleted

## 2021-11-29 MED ORDER — ONDANSETRON 8 MG PO TBDP
8.0000 mg | ORAL_TABLET | Freq: Three times a day (TID) | ORAL | 3 refills | Status: DC | PRN
Start: 2021-11-29 — End: 2022-06-05

## 2021-11-30 ENCOUNTER — Inpatient Hospital Stay: Payer: BC Managed Care – PPO

## 2021-11-30 ENCOUNTER — Other Ambulatory Visit: Payer: Self-pay

## 2021-11-30 VITALS — BP 150/87 | HR 76 | Temp 98.4°F | Resp 18

## 2021-11-30 DIAGNOSIS — Z5111 Encounter for antineoplastic chemotherapy: Secondary | ICD-10-CM | POA: Diagnosis not present

## 2021-11-30 DIAGNOSIS — C50411 Malignant neoplasm of upper-outer quadrant of right female breast: Secondary | ICD-10-CM

## 2021-11-30 MED ORDER — PEGFILGRASTIM-CBQV 6 MG/0.6ML ~~LOC~~ SOSY
6.0000 mg | PREFILLED_SYRINGE | Freq: Once | SUBCUTANEOUS | Status: AC
  Administered 2021-11-30: 6 mg via SUBCUTANEOUS
  Filled 2021-11-30: qty 0.6

## 2021-12-11 ENCOUNTER — Ambulatory Visit
Admission: EM | Admit: 2021-12-11 | Discharge: 2021-12-11 | Disposition: A | Discharge status: Home / Self Care | Payer: BC Managed Care – PPO | Attending: Physician Assistant | Admitting: Physician Assistant

## 2021-12-11 DIAGNOSIS — N3000 Acute cystitis without hematuria: Secondary | ICD-10-CM | POA: Diagnosis present

## 2021-12-11 HISTORY — DX: Malignant (primary) neoplasm, unspecified: C80.1

## 2021-12-11 LAB — POCT URINALYSIS DIP (MANUAL ENTRY)
Bilirubin, UA: NEGATIVE
Glucose, UA: NEGATIVE mg/dL
Ketones, POC UA: NEGATIVE mg/dL
Nitrite, UA: NEGATIVE
Spec Grav, UA: 1.02 (ref 1.010–1.025)
Urobilinogen, UA: 0.2 E.U./dL
pH, UA: 6 (ref 5.0–8.0)

## 2021-12-11 MED ORDER — NITROFURANTOIN MONOHYD MACRO 100 MG PO CAPS: 100.0000 mg | ORAL_CAPSULE | Freq: Two times a day (BID) | ORAL | 0 refills | Status: DC

## 2021-12-11 NOTE — ED Triage Notes (Signed)
Pt presents with urinary urgency, low back pain and abd pain for 2 days.  Pt is a chemo patient, Breast cancer.  ?

## 2021-12-11 NOTE — ED Provider Notes (Signed)
?[facility] ? ? ? ?CSN: [id number] ?Arrival date & time: 12/11/21  3354 ? ? ?  ? ?History   ?Chief Complaint ?Chief Complaint  ?Patient presents with  ? Abdominal Pain  ? Urinary Urgency  ? ? ?HPI ?RONNELL MAKAREWICZ is a 66 y.o. female.  ? ?Patient here today for evaluation of urinary urgency, low back pain and lower abdominal pain she has has the last 2 days. Patient is currently on chemotherapy for breast cancer. She does have side effects from same including nausea, etc.  ? ?The history is provided by the patient.  ? ?Past Medical History:  ?Diagnosis Date  ? Anxiety   ? Cancer [facility])   ? CIN I (cervical intraepithelial neoplasia I)   ? Ectopic pregnancy   ? X 2  ? Endometrial polyp   ? PONV (postoperative nausea and vomiting)   ? Seasonal allergies   ? ? ?Patient Active Problem List  ? Diagnosis Date Noted  ? Genetic testing 10/09/2021  ? Malignant neoplasm of upper-outer quadrant of right breast in female, estrogen receptor positive ([facility]) 09/25/2021  ? Benign colon polyp 10/10/2012  ? ? ?Past Surgical History:  ?Procedure Laterality Date  ? BREAST LUMPECTOMY WITH RADIOACTIVE SEED AND SENTINEL LYMPH NODE BIOPSY Right 10/06/2021  ? Procedure: RIGHT BREAST LUMPECTOMY WITH RADIOACTIVE SEED AND SENTINEL LYMPH NODE BIOPSY;  Surgeon: Donnie Mesa, MD;  Location: [facility];  Service: General;  Laterality: Right;  ? CARPAL TUNNEL RELEASE    ? CERVICAL BIOPSY  W/ LOOP ELECTRODE EXCISION  2003  ? DILATION AND CURETTAGE OF UTERUS  2011  ? ECTOPIC PREGNANCY SURGERY    ? X 2-Right and Left salpingectomy  ? FOOT SURGERY    ? HYSTEROSCOPY  2011  ? endometrial polyp  ? PORTACATH PLACEMENT Right 10/06/2021  ? Procedure: INSERTION PORT-A-CATH;  Surgeon: Donnie Mesa, MD;  Location: [facility];  Service: General;  Laterality: Right;  ? TUBAL LIGATION    ? tubal lig and tubal reversal  ? VAGINAL HYSTERECTOMY  2012  ? ? ?OB History   ? ? Gravida  ?5  ? Para  ?2  ? Term  ?2  ? Preterm  ?   ? AB   ?3  ? Living  ?2  ?  ? ? SAB  ?   ? IAB  ?   ? Ectopic  ?2  ? Multiple  ?   ? Live Births  ?   ?   ?  ?  ? ? ? ?Home Medications   ? ?Prior to Admission medications   ?Medication Sig Start Date End Date Taking? Authorizing Provider  ?nitrofurantoin, macrocrystal-monohydrate, (MACROBID) 100 MG capsule Take 1 capsule (100 mg total) by mouth 2 (two) times daily. 12/11/21  Yes Francene Finders, PA-C  ?dexamethasone (DECADRON) 4 MG tablet Take 2 tablets (8 mg total) by mouth 2 (two) times daily. Start the day before Taxotere. Then again the day after chemo for 3 days. 10/30/21   Benay Pike, MD  ?HYDROcodone-acetaminophen (NORCO/VICODIN) 5-325 MG tablet Take 1 tablet by mouth every 6 (six) hours as needed for moderate pain. ?Patient not taking: Reported on 10/30/2021 10/06/21   Donnie Mesa, MD  ?lidocaine-prilocaine (EMLA) cream Apply to affected area once 10/30/21   Benay Pike, MD  ?magic mouthwash (nystatin, lidocaine, diphenhydrAMINE, alum & mag hydroxide) suspension Swish 5 mL in mouth and swallow four times daily as needed. 11/02/21   Benay Pike, MD  ?magic mouthwash w/lidocaine  SOLN Take 5 mLs by mouth 4 (four) times daily as needed for mouth pain. 11/02/21   Benay Pike, MD  ?ondansetron (ZOFRAN-ODT) 8 MG disintegrating tablet Take 1 tablet (8 mg total) by mouth every 8 (eight) hours as needed for nausea or vomiting. 11/29/21   Benay Pike, MD  ?prochlorperazine (COMPAZINE) 10 MG tablet Take 1 tablet (10 mg total) by mouth every 6 (six) hours as needed (Nausea or vomiting). 10/30/21   Benay Pike, MD  ? ? ?Family History ?Family History  ?Problem Relation Age of Onset  ? Diabetes Mother   ? Hypertension Mother   ? Stroke Mother   ? Heart disease Mother   ? Hypertension Father   ? Melanoma Father 24  ?     metastasized  ? Breast cancer Cousin   ?     dx. 72s  ? ? ?Social History ?Social History  ? ?Tobacco Use  ? Smoking status: Never  ? Smokeless tobacco: Never  ?Vaping Use  ? Vaping Use: Never  used  ?Substance Use Topics  ? Alcohol use: Not Currently  ?  Comment: rarely  ? Drug use: No  ? ? ? ?Allergies   ?Patient has no known allergies. ? ? ?Review of Systems ?Review of Systems  ?Constitutional:  Negative for chills and fever.  ?Eyes:  Negative for discharge and redness.  ?Gastrointestinal:  Positive for abdominal pain and nausea.  ?Genitourinary:  Positive for flank pain and urgency.  ? ? ?Physical Exam ?Triage Vital Signs ?ED Triage Vitals  ?Enc Vitals Group  ?   BP   ?   Pulse   ?   Resp   ?   Temp   ?   Temp src   ?   SpO2   ?   Weight   ?   Height   ?   Head Circumference   ?   Peak Flow   ?   Pain Score   ?   Pain Loc   ?   Pain Edu?   ?   Excl. in [place]?   ? ?No data found. ? ?Updated Vital Signs ?BP 126/86 (BP Location: Left Arm)   Pulse 79   Temp 98 ?F (36.7 ?C)   Resp 15   Ht '5\' 4"'$  (1.626 m)   Wt 161 lb (73 kg)   SpO2 96%   BMI 27.64 kg/m?  ?   ? ?Physical Exam ?Vitals and nursing note reviewed.  ?Constitutional:   ?   General: She is not in acute distress. ?   Appearance: Normal appearance. She is not ill-appearing.  ?HENT:  ?   Head: Normocephalic and atraumatic.  ?Eyes:  ?   Conjunctiva/sclera: Conjunctivae normal.  ?Cardiovascular:  ?   Rate and Rhythm: Normal rate.  ?Pulmonary:  ?   Effort: Pulmonary effort is normal.  ?Neurological:  ?   Mental Status: She is alert.  ?Psychiatric:     ?   Mood and Affect: Mood normal.     ?   Behavior: Behavior normal.     ?   Thought Content: Thought content normal.  ? ? ? ?UC Treatments / Results  ?Labs ?(all labs ordered are listed, but only abnormal results are displayed) ?Labs Reviewed  ?POCT URINALYSIS DIP (MANUAL ENTRY) - Abnormal; Notable for the following components:  ?    Result Value  ? Clarity, UA cloudy (*)   ? Blood, UA small (*)   ? Protein Ur, POC trace (*)   ?  Leukocytes, UA Small (1+) (*)   ? All other components within normal limits  ?URINE CULTURE  ? ? ?EKG ? ? ?Radiology ?No results found. ? ?Procedures ?Procedures (including  critical care time) ? ?Medications Ordered in UC ?Medications - No data to display ? ?Initial Impression / Assessment and Plan / UC Course  ?I have reviewed the triage vital signs and the nursing notes. ? ?Pertinent labs & imaging results that were available during my care of the patient were reviewed by me and considered in my medical decision making (see chart for details). ? ?  ?Macrobid prescribed to cover UTI. Urine culture ordered. Encouraged follow up with any further concerns.  ? ?Final Clinical Impressions(s) / UC Diagnoses  ? ?Final diagnoses:  ?Acute cystitis without hematuria  ? ?Discharge Instructions   ?None ?  ? ?ED Prescriptions   ? ? Medication Sig Dispense Auth. Provider  ? nitrofurantoin, macrocrystal-monohydrate, (MACROBID) 100 MG capsule Take 1 capsule (100 mg total) by mouth 2 (two) times daily. 10 capsule Francene Finders, PA-C  ? ?  ? ?PDMP not reviewed this encounter. ?  ?Francene Finders, PA-C ?12/11/21 1103 ? ?

## 2021-12-13 LAB — URINE CULTURE: Culture: >=100000 — AB

## 2021-12-18 DIAGNOSIS — Z95828 Presence of other vascular implants and grafts: Secondary | ICD-10-CM | POA: Insufficient documentation

## 2021-12-19 ENCOUNTER — Inpatient Hospital Stay: Payer: BC Managed Care – PPO | Attending: Hematology and Oncology | Admitting: Hematology and Oncology

## 2021-12-19 ENCOUNTER — Other Ambulatory Visit: Payer: BC Managed Care – PPO

## 2021-12-19 ENCOUNTER — Encounter: Payer: Self-pay | Admitting: Hematology and Oncology

## 2021-12-19 ENCOUNTER — Inpatient Hospital Stay: Payer: BC Managed Care – PPO

## 2021-12-19 ENCOUNTER — Other Ambulatory Visit: Payer: Self-pay

## 2021-12-19 DIAGNOSIS — R432 Parageusia: Secondary | ICD-10-CM | POA: Diagnosis not present

## 2021-12-19 DIAGNOSIS — Z170 Estrogen receptor positive status [ER+]: Secondary | ICD-10-CM | POA: Diagnosis not present

## 2021-12-19 DIAGNOSIS — Z5189 Encounter for other specified aftercare: Secondary | ICD-10-CM | POA: Insufficient documentation

## 2021-12-19 DIAGNOSIS — Z5111 Encounter for antineoplastic chemotherapy: Secondary | ICD-10-CM | POA: Diagnosis present

## 2021-12-19 DIAGNOSIS — Z803 Family history of malignant neoplasm of breast: Secondary | ICD-10-CM | POA: Diagnosis not present

## 2021-12-19 DIAGNOSIS — Z95828 Presence of other vascular implants and grafts: Secondary | ICD-10-CM

## 2021-12-19 DIAGNOSIS — C50411 Malignant neoplasm of upper-outer quadrant of right female breast: Secondary | ICD-10-CM | POA: Diagnosis present

## 2021-12-19 DIAGNOSIS — Z808 Family history of malignant neoplasm of other organs or systems: Secondary | ICD-10-CM | POA: Diagnosis not present

## 2021-12-19 LAB — CMP (CANCER CENTER ONLY)
ALT: 15 U/L (ref 0–44)
AST: 13 U/L — ABNORMAL LOW (ref 15–41)
Albumin: 4 g/dL (ref 3.5–5.0)
Alkaline Phosphatase: 59 U/L (ref 38–126)
Anion gap: 11 (ref 5–15)
BUN: 20 mg/dL (ref 8–23)
CO2: 22 mmol/L (ref 22–32)
Calcium: 9.1 mg/dL (ref 8.9–10.3)
Chloride: 105 mmol/L (ref 98–111)
Creatinine: 0.77 mg/dL (ref 0.44–1.00)
GFR, Estimated: >60 mL/min (ref >60)
Glucose, Bld: 188 mg/dL — ABNORMAL HIGH (ref 70–99)
Potassium: 3.9 mmol/L (ref 3.5–5.1)
Sodium: 138 mmol/L (ref 135–145)
Total Bilirubin: 0.4 mg/dL (ref 0.3–1.2)
Total Protein: 6.8 g/dL (ref 6.5–8.1)

## 2021-12-19 LAB — CBC WITH DIFFERENTIAL (CANCER CENTER ONLY)
Abs Immature Granulocytes: 0.02 10*3/uL (ref 0.00–0.07)
Basophils Absolute: 0 10*3/uL (ref 0.0–0.1)
Basophils Relative: 0 %
Eosinophils Absolute: 0 10*3/uL (ref 0.0–0.5)
Eosinophils Relative: 0 %
HCT: 33.9 % — ABNORMAL LOW (ref 36.0–46.0)
Hemoglobin: 11.3 g/dL — ABNORMAL LOW (ref 12.0–15.0)
Immature Granulocytes: 0 %
Lymphocytes Relative: 9 %
Lymphs Abs: 0.9 10*3/uL (ref 0.7–4.0)
MCH: 30.3 pg (ref 26.0–34.0)
MCHC: 33.3 g/dL (ref 30.0–36.0)
MCV: 90.9 fL (ref 80.0–100.0)
Monocytes Absolute: 0.3 10*3/uL (ref 0.1–1.0)
Monocytes Relative: 3 %
Neutro Abs: 8 10*3/uL — ABNORMAL HIGH (ref 1.7–7.7)
Neutrophils Relative %: 88 %
Platelet Count: 434 10*3/uL — ABNORMAL HIGH (ref 150–400)
RBC: 3.73 MIL/uL — ABNORMAL LOW (ref 3.87–5.11)
RDW: 14.5 % (ref 11.5–15.5)
WBC Count: 9.2 10*3/uL (ref 4.0–10.5)
nRBC: 0 % (ref 0.0–0.2)

## 2021-12-19 MED ORDER — HEPARIN SOD (PORK) LOCK FLUSH 100 UNIT/ML IV SOLN
500.0000 [IU] | Freq: Once | INTRAVENOUS | Status: AC | PRN
  Administered 2021-12-19: 500 [IU]

## 2021-12-19 MED ORDER — SODIUM CHLORIDE 0.9% FLUSH
10.0000 mL | Freq: Once | INTRAVENOUS | Status: AC
  Administered 2021-12-19: 10 mL

## 2021-12-19 MED ORDER — SODIUM CHLORIDE 0.9 % IV SOLN
600.0000 mg/m2 | Freq: Once | INTRAVENOUS | Status: AC
  Administered 2021-12-19: 1100 mg via INTRAVENOUS
  Filled 2021-12-19: qty 55

## 2021-12-19 MED ORDER — SODIUM CHLORIDE 0.9% FLUSH
10.0000 mL | INTRAVENOUS | Status: DC | PRN
  Administered 2021-12-19: 10 mL

## 2021-12-19 MED ORDER — PALONOSETRON HCL INJECTION 0.25 MG/5ML
0.2500 mg | Freq: Once | INTRAVENOUS | Status: AC
  Administered 2021-12-19: 0.25 mg via INTRAVENOUS
  Filled 2021-12-19: qty 5

## 2021-12-19 MED ORDER — SODIUM CHLORIDE 0.9 % IV SOLN: Freq: Once | INTRAVENOUS | Status: AC

## 2021-12-19 MED ORDER — SODIUM CHLORIDE 0.9 % IV SOLN
10.0000 mg | Freq: Once | INTRAVENOUS | Status: AC
  Administered 2021-12-19: 10 mg via INTRAVENOUS
  Filled 2021-12-19: qty 10

## 2021-12-19 MED ORDER — SODIUM CHLORIDE 0.9 % IV SOLN
75.0000 mg/m2 | Freq: Once | INTRAVENOUS | Status: AC
  Administered 2021-12-19: 140 mg via INTRAVENOUS
  Filled 2021-12-19: qty 14

## 2021-12-19 NOTE — Patient Instructions (Signed)
[facility]  Discharge Instructions: ?Thank you for choosing [facility] to provide your oncology and hematology care.  ? ?If you have a lab appointment with the [facility], please go directly to the [facility] and check in at the registration area. ?  ?Wear comfortable clothing and clothing appropriate for easy access to any Portacath or PICC line.  ? ?We strive to give you quality time with your provider. You may need to reschedule your appointment if you arrive late (15 or more minutes).  Arriving late affects you and other patients whose appointments are after yours.  Also, if you miss three or more appointments without notifying the office, you may be dismissed from the clinic at the provider?s discretion.    ?  ?For prescription refill requests, have your pharmacy contact our office and allow 72 hours for refills to be completed.   ? ?Today you received the following chemotherapy and/or immunotherapy agents: Docetaxel & Cytoxan   ?  ?To help prevent nausea and vomiting after your treatment, we encourage you to take your nausea medication as directed. ? ?BELOW ARE SYMPTOMS THAT SHOULD BE REPORTED IMMEDIATELY: ?*FEVER GREATER THAN 100.4 F (38 ?C) OR HIGHER ?*CHILLS OR SWEATING ?*NAUSEA AND VOMITING THAT IS NOT CONTROLLED WITH YOUR NAUSEA MEDICATION ?*UNUSUAL SHORTNESS OF BREATH ?*UNUSUAL BRUISING OR BLEEDING ?*URINARY PROBLEMS (pain or burning when urinating, or frequent urination) ?*BOWEL PROBLEMS (unusual diarrhea, constipation, pain near the anus) ?TENDERNESS IN MOUTH AND THROAT WITH OR WITHOUT PRESENCE OF ULCERS (sore throat, sores in mouth, or a toothache) ?UNUSUAL RASH, SWELLING OR PAIN  ?UNUSUAL VAGINAL DISCHARGE OR ITCHING  ? ?Items with * indicate a potential emergency and should be followed up as soon as possible or go to the Emergency Department if any problems should occur. ? ?Please show the CHEMOTHERAPY ALERT CARD or IMMUNOTHERAPY ALERT CARD at  check-in to the Emergency Department and triage nurse. ? ?Should you have questions after your visit or need to cancel or reschedule your appointment, please contact [facility]  Dept: [phone]  and follow the prompts.  Office hours are 8:00 a.m. to 4:30 p.m. Monday - Friday. Please note that voicemails left after 4:00 p.m. may not be returned until the following business day.  We are closed weekends and major holidays. You have access to a nurse at all times for urgent questions. Please call the main number to the clinic Dept: [phone] and follow the prompts. ? ? ?For any non-urgent questions, you may also contact your provider using MyChart. We now offer e-Visits for anyone 58 and older to request care online for non-urgent symptoms. For details visit [email or web address]. ?  ?Also download the MyChart app! Go to the app store, search "MyChart", open the app, select [facility], and log in with your MyChart username and password. ? ?Due to Covid, a mask is required upon entering the hospital/clinic. If you do not have a mask, one will be given to you upon arrival. For doctor visits, patients may have 1 support person aged 84 or older with them. For treatment visits, patients cannot have anyone with them due to current Covid guidelines and our immunocompromised population.  ? ?

## 2021-12-19 NOTE — Assessment & Plan Note (Signed)
Dysgeusia is likely related to chemotherapy.  She was encouraged to try all foods to see which taste better for her.  We will continue to monitor her weight ?

## 2021-12-19 NOTE — Assessment & Plan Note (Signed)
This is a very pleasant 66 year old postmenopausal female patient with no significant past medical history referred to medical oncology for new diagnosis of right breast invasive ductal carcinoma. ?We have discussed about consideration for adjuvant chemotherapy given her tumor with high grade, high proliferation index of 90% on the original specimen, The tumor has many unfavorable features, including grade 3, ER weakly positive, PR negative, Ki 90%. We discussed about oncotype testing however given several unfavorable features, I would still recommend adjuvant chemotherapy. Patient doesn't want to proceed with oncotype testing. She is agreeable to proceed with chemotherapy. Post chemo, she will proceed with adjuvant radiation followed by antiestrogen therapy. ?Given LN negative disease, I think adjuvant TC for 4 cycles is a reasonable regimen. ?  She is here for cycle 3 of TC.  She is doing really well on chemotherapy.  Review of systems pertinent for grade 1 nausea, grade 1 constipation and diarrhea dysgeusia.  Physical examination today unremarkable.  Okay to proceed with chemotherapy if all labs are within parameters. ?She will return to clinic in 3 weeks before anticipated cycle 4 of TC. ?

## 2021-12-19 NOTE — Progress Notes (Signed)
[facility] Cancer Follow up: ?  ? ?Susan Dus, MD ?[address] ?[place] [address] ? ? ?DIAGNOSIS:  Cancer Staging  ?Malignant neoplasm of upper-outer quadrant of right breast in female, estrogen receptor positive ([facility]) ?Staging form: Breast, AJCC 8th Edition ?- Clinical: Stage IB (cT1c, cN0, cM0, G3, ER+, PR-, HER2-) - Unsigned ?Stage prefix: Initial diagnosis ?Method of lymph node assessment: Clinical ?Histologic grading system: 3 grade system ?- Pathologic: Stage IIA (pT2, pN0, cM0, G3, ER+, PR-, HER2-) - Signed by Susan Phlegm, NP on 11/27/2021 ?Histologic grading system: 3 grade system ? ? ?SUMMARY OF ONCOLOGIC HISTORY: ?Oncology History  ?Malignant neoplasm of upper-outer quadrant of right breast in female, estrogen receptor positive ([facility])  ?08/08/2021 Imaging  ? August 08, 2021, patient had bilateral screening mammogram which showed 2 possible masses in the right breast which warrant further evaluation.  No concerning findings in the left breast. ?She had diagnostic mammogram on 09/13/2021 which noted possible mass in the upper central breast at middle depth which persists on additional views is 1.9 cm oval mass with indistinct margins.  Second previously noted possible mass in the upper outer breast at anterior to middle depth dissipates on additional views consistent with overlapping fibroglandular tissue. ?Ultrasound showed suspicious 1.9 cm mass at the right breast 12 o'clock position for which ultrasound-guided biopsy was recommended no right axillary lymphadenopathy. ?  ?09/25/2021 Initial Diagnosis  ? Malignant neoplasm of upper-outer quadrant of right breast in female, estrogen receptor positive ([facility]) ?  ? Genetic Testing  ? [facility] CancerNext-Expanded Panel was Negative. 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 Report date is 10/20/2021. ? ?The CancerNext-Expanded gene panel offered by [facility] and includes  sequencing, rearrangement, and RNA analysis for the following 77 genes: AIP, ALK, APC, ATM, AXIN2, BAP1, BARD1, BLM, BMPR1A, BRCA1, BRCA2, BRIP1, CDC73, CDH1, CDK4, CDKN1B, CDKN2A, CHEK2, CTNNA1, DICER1, FANCC, FH, FLCN, GALNT12, KIF1B, LZTR1, MAX, MEN1, MET, MLH1, MSH2, MSH3, MSH6, MUTYH, NBN, NF1, NF2, NTHL1, PALB2, PHOX2B, PMS2, POT1, PRKAR1A, PTCH1, PTEN, RAD51C, RAD51D, RB1, RECQL, RET, SDHA, SDHAF2, SDHB, SDHC, SDHD, SMAD4, SMARCA4, SMARCB1, SMARCE1, STK11, SUFU, TMEM127, TP53, TSC1, TSC2, VHL and XRCC2 (sequencing and deletion/duplication); EGFR, EGLN1, HOXB13, KIT, MITF, PDGFRA, POLD1, and POLE (sequencing only); EPCAM and GREM1 (deletion/duplication only).  ?  ?10/06/2021 Surgery  ? A. BREAST, RIGHT, LUMPECTOMY:  ?-  Invasive carcinoma of no special type (ductal), grade3 (total  ?Nottingham score 9), 25 mm in greatest dimension.  ?-  Ductal carcinoma in situ, grade III (high), 0.3 cm in greatest  ?dimension.  ?Prognostics from initial biopsy showed ER +30%, weak staining, PR 0%, negative, Ki-67 of 90%.  HER2 negative by IHC. ? ? ?  ?11/06/2021 -  Chemotherapy  ? Patient is on Treatment Plan : BREAST TC q21d  ? ?  ?  ?11/27/2021 Cancer Staging  ? Staging form: Breast, AJCC 8th Edition ?- Pathologic: Stage IIA (pT2, pN0, cM0, G3, ER+, PR-, HER2-) - Signed by Susan Phlegm, NP on 11/27/2021 ?Histologic grading system: 3 grade system ? ?  ? ? ?CURRENT THERAPY: Taxotere/Cytoxan ? ?INTERVAL HISTORY: ?Susan Benjamin 66 y.o. female returns for evaluation prior to receiving her third cycle of taxotere and cytoxan. She denies any new complaints. ?She has been tolerating chemotherapy very well.  She has noticed some dysgeusia, not everything tastes good.  No neuropathy reported.  She has felt a bit of indigestion for which she uses Zofran  and this helps tremendously.  Intermittent constipation likely related to the nausea medications.  Some episodes of hypoglycemia again likely related to dexamethasone. ?Rest of  the pertinent 10 point ROS reviewed and negative ? ?Review of Systems  ?Constitutional:  Negative for chills and fever.  ?HENT:  Negative for hearing loss.   ?Respiratory:  Negative for cough and shortness of breath.   ?Cardiovascular:  Negative for chest pain and palpitations.  ?Gastrointestinal:  Negative for abdominal pain, constipation, diarrhea, nausea and vomiting.  ?Skin:  Negative for itching and rash.  ?Neurological:  Negative for dizziness and headaches.  ?Endo/Heme/Allergies:  Does not bruise/bleed easily.  ?Psychiatric/Behavioral:  Negative for depression. The patient is not nervous/anxious.   ? ? ? ?Patient Active Problem List  ? Diagnosis Date Noted  ? Dysgeusia 12/19/2021  ? Port-A-Cath in place 12/18/2021  ? Genetic testing 10/09/2021  ? Malignant neoplasm of upper-outer quadrant of right breast in female, estrogen receptor positive ([facility]) 09/25/2021  ? Benign colon polyp 10/10/2012  ? ? ?has No Known Allergies. ? ?MEDICAL HISTORY: ?Past Medical History:  ?Diagnosis Date  ? Anxiety   ? Cancer [facility])   ? CIN I (cervical intraepithelial neoplasia I)   ? Ectopic pregnancy   ? X 2  ? Endometrial polyp   ? PONV (postoperative nausea and vomiting)   ? Seasonal allergies   ? ? ?SURGICAL HISTORY: ?Past Surgical History:  ?Procedure Laterality Date  ? BREAST LUMPECTOMY WITH RADIOACTIVE SEED AND SENTINEL LYMPH NODE BIOPSY Right 10/06/2021  ? Procedure: RIGHT BREAST LUMPECTOMY WITH RADIOACTIVE SEED AND SENTINEL LYMPH NODE BIOPSY;  Surgeon: Donnie Mesa, MD;  Location: [facility];  Service: General;  Laterality: Right;  ? CARPAL TUNNEL RELEASE    ? CERVICAL BIOPSY  W/ LOOP ELECTRODE EXCISION  2003  ? DILATION AND CURETTAGE OF UTERUS  2011  ? ECTOPIC PREGNANCY SURGERY    ? X 2-Right and Left salpingectomy  ? FOOT SURGERY    ? HYSTEROSCOPY  2011  ? endometrial polyp  ? PORTACATH PLACEMENT Right 10/06/2021  ? Procedure: INSERTION PORT-A-CATH;  Surgeon: Donnie Mesa, MD;  Location: [facility];  Service: General;  Laterality: Right;  ? TUBAL LIGATION    ? tubal lig and tubal reversal  ? VAGINAL HYSTERECTOMY  2012  ? ? ?SOCIAL HISTORY: ?Social History  ? ?Socioeconomic History  ? Marital status: Married  ?  Spouse name: Not on file  ? Number of children: Not on file  ? Years of education: Not on file  ? Highest education level: Not on file  ?Occupational History  ? Not on file  ?Tobacco Use  ? Smoking status: Never  ? Smokeless tobacco: Never  ?Vaping Use  ? Vaping Use: Never used  ?Substance and Sexual Activity  ? Alcohol use: Not Currently  ?  Comment: rarely  ? Drug use: No  ? Sexual activity: Yes  ?  Birth control/protection: Surgical  ?  Comment: INTERCOURSE AGE 36, SEXUAL PARTNERS LESS THAN 5  ?Other Topics Concern  ? Not on file  ?Social History Narrative  ? Not on file  ? ?Social Determinants of Health  ? ?Financial Resource Strain: Not on file  ?Food Insecurity: Not on file  ?Transportation Needs: Not on file  ?Physical Activity: Not on file  ?Stress: Not on file  ?Social Connections: Not on file  ?Intimate Partner Violence: Not on file  ? ? ?FAMILY HISTORY: ?Family History  ?Problem Relation Age of Onset  ? Diabetes  Mother   ? Hypertension Mother   ? Stroke Mother   ? Heart disease Mother   ? Hypertension Father   ? Melanoma Father 14  ?     metastasized  ? Breast cancer Cousin   ?     dx. 92s  ? ? ? ?PHYSICAL EXAMINATION ? ?ECOG PERFORMANCE STATUS: 1 - Symptomatic but completely ambulatory ? ?Vitals:  ? 12/19/21 0841  ?BP: (!) 152/73  ?Pulse: 88  ?Resp: 18  ?Temp: 97.9 ?F (36.6 ?C)  ?SpO2: 100%  ? ? ?Physical Exam ?Constitutional:   ?   General: She is not in acute distress. ?   Appearance: Normal appearance. She is not toxic-appearing.  ?HENT:  ?   Head: Normocephalic and atraumatic.  ?Eyes:  ?   General: No scleral icterus. ?Cardiovascular:  ?   Rate and Rhythm: Normal rate and regular rhythm.  ?   Pulses: Normal pulses.  ?   Heart sounds: Normal heart sounds.  ?Pulmonary:  ?   Effort:  Pulmonary effort is normal.  ?   Breath sounds: Normal breath sounds.  ?Abdominal:  ?   General: Abdomen is flat. Bowel sounds are normal. There is no distension.  ?   Palpations: Abdomen is soft.  ?

## 2021-12-21 ENCOUNTER — Other Ambulatory Visit: Payer: Self-pay

## 2021-12-21 ENCOUNTER — Inpatient Hospital Stay: Payer: BC Managed Care – PPO

## 2021-12-21 VITALS — BP 154/76 | HR 66 | Temp 97.9°F | Resp 18

## 2021-12-21 DIAGNOSIS — Z5111 Encounter for antineoplastic chemotherapy: Secondary | ICD-10-CM | POA: Diagnosis not present

## 2021-12-21 DIAGNOSIS — Z170 Estrogen receptor positive status [ER+]: Secondary | ICD-10-CM

## 2021-12-21 MED ORDER — PEGFILGRASTIM-CBQV 6 MG/0.6ML ~~LOC~~ SOSY
6.0000 mg | PREFILLED_SYRINGE | Freq: Once | SUBCUTANEOUS | Status: AC
  Administered 2021-12-21: 6 mg via SUBCUTANEOUS
  Filled 2021-12-21: qty 0.6

## 2022-01-08 ENCOUNTER — Ambulatory Visit: Payer: BC Managed Care – PPO | Attending: Surgery

## 2022-01-08 VITALS — Wt 166.0 lb

## 2022-01-08 DIAGNOSIS — Z483 Aftercare following surgery for neoplasm: Secondary | ICD-10-CM | POA: Insufficient documentation

## 2022-01-08 NOTE — Therapy (Signed)
?  OUTPATIENT PHYSICAL THERAPY SOZO SCREENING NOTE ? ? ?Patient Name: Susan Benjamin ?MRN: [id number] ?DOB:[birth year], 66 y.o., female ?Today's Date: 01/08/2022 ? ?PCP: [name], MD ?REFERRING PROVIDER: Donnie Mesa, MD ? ? PT End of Session - 01/08/22 1027   ? ? Visit Number 2   # unchanged due to screen only  ? PT Start Time 1025   ? PT Stop Time 1030   ? PT Time Calculation (min) 5 min   ? Activity Tolerance Patient tolerated treatment well   ? Behavior During Therapy [facility] for tasks assessed/performed   ? ?  ?  ? ?  ? ? ?Past Medical History:  ?Diagnosis Date  ? Anxiety   ? Cancer [facility])   ? CIN I (cervical intraepithelial neoplasia I)   ? Ectopic pregnancy   ? X 2  ? Endometrial polyp   ? PONV (postoperative nausea and vomiting)   ? Seasonal allergies   ? ?Past Surgical History:  ?Procedure Laterality Date  ? BREAST LUMPECTOMY WITH RADIOACTIVE SEED AND SENTINEL LYMPH NODE BIOPSY Right 10/06/2021  ? Procedure: RIGHT BREAST LUMPECTOMY WITH RADIOACTIVE SEED AND SENTINEL LYMPH NODE BIOPSY;  Surgeon: Donnie Mesa, MD;  Location: [facility];  Service: General;  Laterality: Right;  ? CARPAL TUNNEL RELEASE    ? CERVICAL BIOPSY  W/ LOOP ELECTRODE EXCISION  2003  ? DILATION AND CURETTAGE OF UTERUS  2011  ? ECTOPIC PREGNANCY SURGERY    ? X 2-Right and Left salpingectomy  ? FOOT SURGERY    ? HYSTEROSCOPY  2011  ? endometrial polyp  ? PORTACATH PLACEMENT Right 10/06/2021  ? Procedure: INSERTION PORT-A-CATH;  Surgeon: Donnie Mesa, MD;  Location: [place];  Service: General;  Laterality: Right;  ? TUBAL LIGATION    ? tubal lig and tubal reversal  ? VAGINAL HYSTERECTOMY  2012  ? ?Patient Active Problem List  ? Diagnosis Date Noted  ? Dysgeusia 12/19/2021  ? Port-A-Cath in place 12/18/2021  ? Genetic testing 10/09/2021  ? Malignant neoplasm of upper-outer quadrant of right breast in female, estrogen receptor positive ([place]) 09/25/2021  ? Benign colon polyp 10/10/2012  ? ? ?REFERRING DIAG: right  breast cancer at risk for lymphedema ? ?THERAPY DIAG:  ?Aftercare following surgery for neoplasm ? ?PERTINENT HISTORY: Patient was diagnosed on 09/19/2021 with right 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 She underwent a right lumpectomy and sentinel node biopsy (3 negative nodes) on 10/06/2021. It is weakly ER positive, PR negative and HER2 negative with a Ki67 of 90%.  ? ?PRECAUTIONS: right UE Lymphedema risk, None ? ?SUBJECTIVE: Pt returns for her 3 month L-Dex screen.  ? ?PAIN:  ?Are you having pain? No ? ?SOZO SCREENING: ?Patient was assessed today using the SOZO machine to determine the lymphedema index score. This was compared to her baseline score. It was determined that she is within the recommended range when compared to her baseline and no further action is needed at this time. She will continue SOZO screenings. These are done every 3 months for 2 years post operatively followed by every 6 months for 2 years, and then annually. ? ? ? ?Otelia Limes, PTA ?01/08/2022, 10:32 AM ? ?  ? ?

## 2022-01-09 ENCOUNTER — Other Ambulatory Visit: Payer: BC Managed Care – PPO

## 2022-01-09 ENCOUNTER — Inpatient Hospital Stay: Payer: BC Managed Care – PPO

## 2022-01-09 ENCOUNTER — Telehealth: Payer: Self-pay | Admitting: Radiation Oncology

## 2022-01-09 ENCOUNTER — Inpatient Hospital Stay: Payer: BC Managed Care – PPO | Attending: Hematology and Oncology | Admitting: Hematology and Oncology

## 2022-01-09 ENCOUNTER — Ambulatory Visit: Payer: Self-pay | Admitting: Surgery

## 2022-01-09 ENCOUNTER — Other Ambulatory Visit: Payer: Self-pay

## 2022-01-09 ENCOUNTER — Encounter: Payer: Self-pay | Admitting: Hematology and Oncology

## 2022-01-09 ENCOUNTER — Encounter: Payer: Self-pay | Admitting: *Deleted

## 2022-01-09 DIAGNOSIS — Z5111 Encounter for antineoplastic chemotherapy: Secondary | ICD-10-CM | POA: Diagnosis present

## 2022-01-09 DIAGNOSIS — D649 Anemia, unspecified: Secondary | ICD-10-CM

## 2022-01-09 DIAGNOSIS — Z170 Estrogen receptor positive status [ER+]: Secondary | ICD-10-CM

## 2022-01-09 DIAGNOSIS — Z5189 Encounter for other specified aftercare: Secondary | ICD-10-CM | POA: Diagnosis not present

## 2022-01-09 DIAGNOSIS — R252 Cramp and spasm: Secondary | ICD-10-CM

## 2022-01-09 DIAGNOSIS — C50411 Malignant neoplasm of upper-outer quadrant of right female breast: Secondary | ICD-10-CM

## 2022-01-09 DIAGNOSIS — Z95828 Presence of other vascular implants and grafts: Secondary | ICD-10-CM

## 2022-01-09 LAB — CBC WITH DIFFERENTIAL (CANCER CENTER ONLY)
Abs Immature Granulocytes: 0.02 10*3/uL (ref 0.00–0.07)
Basophils Absolute: 0 10*3/uL (ref 0.0–0.1)
Basophils Relative: 0 %
Eosinophils Absolute: 0 10*3/uL (ref 0.0–0.5)
Eosinophils Relative: 0 %
HCT: 31.5 % — ABNORMAL LOW (ref 36.0–46.0)
Hemoglobin: 10.7 g/dL — ABNORMAL LOW (ref 12.0–15.0)
Immature Granulocytes: 0 %
Lymphocytes Relative: 13 %
Lymphs Abs: 1.1 10*3/uL (ref 0.7–4.0)
MCH: 31.1 pg (ref 26.0–34.0)
MCHC: 34 g/dL (ref 30.0–36.0)
MCV: 91.6 fL (ref 80.0–100.0)
Monocytes Absolute: 0.6 10*3/uL (ref 0.1–1.0)
Monocytes Relative: 7 %
Neutro Abs: 6.8 10*3/uL (ref 1.7–7.7)
Neutrophils Relative %: 80 %
Platelet Count: 382 10*3/uL (ref 150–400)
RBC: 3.44 MIL/uL — ABNORMAL LOW (ref 3.87–5.11)
RDW: 15.4 % (ref 11.5–15.5)
WBC Count: 8.5 10*3/uL (ref 4.0–10.5)
nRBC: 0 % (ref 0.0–0.2)

## 2022-01-09 LAB — CMP (CANCER CENTER ONLY)
ALT: 19 U/L (ref 0–44)
AST: 19 U/L (ref 15–41)
Albumin: 3.9 g/dL (ref 3.5–5.0)
Alkaline Phosphatase: 58 U/L (ref 38–126)
Anion gap: 11 (ref 5–15)
BUN: 17 mg/dL (ref 8–23)
CO2: 21 mmol/L — ABNORMAL LOW (ref 22–32)
Calcium: 9.2 mg/dL (ref 8.9–10.3)
Chloride: 104 mmol/L (ref 98–111)
Creatinine: 0.8 mg/dL (ref 0.44–1.00)
GFR, Estimated: >60 mL/min (ref >60)
Glucose, Bld: 165 mg/dL — ABNORMAL HIGH (ref 70–99)
Potassium: 3.8 mmol/L (ref 3.5–5.1)
Sodium: 136 mmol/L (ref 135–145)
Total Bilirubin: 0.6 mg/dL (ref 0.3–1.2)
Total Protein: 6.4 g/dL — ABNORMAL LOW (ref 6.5–8.1)

## 2022-01-09 MED ORDER — HEPARIN SOD (PORK) LOCK FLUSH 100 UNIT/ML IV SOLN
500.0000 [IU] | Freq: Once | INTRAVENOUS | Status: AC | PRN
  Administered 2022-01-09: 500 [IU]

## 2022-01-09 MED ORDER — SODIUM CHLORIDE 0.9 % IV SOLN
10.0000 mg | Freq: Once | INTRAVENOUS | Status: AC
  Administered 2022-01-09: 10 mg via INTRAVENOUS
  Filled 2022-01-09: qty 10

## 2022-01-09 MED ORDER — SODIUM CHLORIDE 0.9 % IV SOLN
75.0000 mg/m2 | Freq: Once | INTRAVENOUS | Status: AC
  Administered 2022-01-09: 140 mg via INTRAVENOUS
  Filled 2022-01-09: qty 14

## 2022-01-09 MED ORDER — SODIUM CHLORIDE 0.9 % IV SOLN
600.0000 mg/m2 | Freq: Once | INTRAVENOUS | Status: AC
  Administered 2022-01-09: 1100 mg via INTRAVENOUS
  Filled 2022-01-09: qty 55

## 2022-01-09 MED ORDER — SODIUM CHLORIDE 0.9% FLUSH
10.0000 mL | INTRAVENOUS | Status: DC | PRN
  Administered 2022-01-09: 10 mL

## 2022-01-09 MED ORDER — SODIUM CHLORIDE 0.9% FLUSH
10.0000 mL | Freq: Once | INTRAVENOUS | Status: AC
  Administered 2022-01-09: 10 mL

## 2022-01-09 MED ORDER — SODIUM CHLORIDE 0.9 % IV SOLN: Freq: Once | INTRAVENOUS | Status: AC

## 2022-01-09 MED ORDER — PALONOSETRON HCL INJECTION 0.25 MG/5ML
0.2500 mg | Freq: Once | INTRAVENOUS | Status: AC
  Administered 2022-01-09: 0.25 mg via INTRAVENOUS
  Filled 2022-01-09: qty 5

## 2022-01-09 MED ORDER — BACLOFEN 5 MG PO TABS: 5.0000 mg | ORAL_TABLET | Freq: Three times a day (TID) | ORAL | 0 refills | Status: DC | PRN

## 2022-01-09 NOTE — Telephone Encounter (Signed)
5/9 @ 11:35 am Left voicemail for patient to call our office.  ?

## 2022-01-09 NOTE — Assessment & Plan Note (Signed)
Patient had severe muscle cramps in her back and in her lower extremities after her last chemotherapy.  We have given her a short prescription for baclofen for as needed muscle cramps. ?

## 2022-01-09 NOTE — Patient Instructions (Signed)
Susan Benjamin  Discharge Instructions: ?Thank you for choosing [facility] to provide your oncology and hematology care.  ? ?If you have a lab appointment with the [facility], please go directly to the [facility] and check in at the registration area. ?  ?Wear comfortable clothing and clothing appropriate for easy access to any Portacath or PICC line.  ? ?We strive to give you quality time with your provider. You may need to reschedule your appointment if you arrive late (15 or more minutes).  Arriving late affects you and other patients whose appointments are after yours.  Also, if you miss three or more appointments without notifying the office, you may be dismissed from the clinic at the provider?s discretion.    ?  ?For prescription refill requests, have your pharmacy contact our office and allow 72 hours for refills to be completed.   ? ?Today you received the following chemotherapy and/or immunotherapy agents docetaxel, cytoxan    ?  ?To help prevent nausea and vomiting after your treatment, we encourage you to take your nausea medication as directed. ? ?BELOW ARE SYMPTOMS THAT SHOULD BE REPORTED IMMEDIATELY: ?*FEVER GREATER THAN 100.4 F (38 ?C) OR HIGHER ?*CHILLS OR SWEATING ?*NAUSEA AND VOMITING THAT IS NOT CONTROLLED WITH YOUR NAUSEA MEDICATION ?*UNUSUAL SHORTNESS OF BREATH ?*UNUSUAL BRUISING OR BLEEDING ?*URINARY PROBLEMS (pain or burning when urinating, or frequent urination) ?*BOWEL PROBLEMS (unusual diarrhea, constipation, pain near the anus) ?TENDERNESS IN MOUTH AND THROAT WITH OR WITHOUT PRESENCE OF ULCERS (sore throat, sores in mouth, or a toothache) ?UNUSUAL RASH, SWELLING OR PAIN  ?UNUSUAL VAGINAL DISCHARGE OR ITCHING  ? ?Items with * indicate a potential emergency and should be followed up as soon as possible or go to the Emergency Department if any problems should occur. ? ?Please show the CHEMOTHERAPY ALERT CARD or IMMUNOTHERAPY ALERT CARD at  check-in to the Emergency Department and triage nurse. ? ?Should you have questions after your visit or need to cancel or reschedule your appointment, please contact [facility]  Dept: [phone]  and follow the prompts.  Office hours are 8:00 a.m. to 4:30 p.m. Monday - Friday. Please note that voicemails left after 4:00 p.m. may not be returned until the following business day.  We are closed weekends and major holidays. You have access to a nurse at all times for urgent questions. Please call the main number to the clinic Dept: [phone] and follow the prompts. ? ? ?For any non-urgent questions, you may also contact your provider using MyChart. We now offer e-Visits for anyone 14 and older to request care online for non-urgent symptoms. For details visit [email or web address]. ?  ?Also download the MyChart app! Go to the app store, search "MyChart", open the app, select [facility], and log in with your MyChart username and password. ? ?Due to Covid, a mask is required upon entering the hospital/clinic. If you do not have a mask, one will be given to you upon arrival. For doctor visits, patients may have 1 support person aged 40 or older with them. For treatment visits, patients cannot have anyone with them due to current Covid guidelines and our immunocompromised population.  ? ?

## 2022-01-09 NOTE — Progress Notes (Signed)
[facility] Follow up: ?  ? ?[name], MD ?[address] ?[place] [address] ? ? ?DIAGNOSIS:  Cancer Staging  ?Malignant neoplasm of upper-outer quadrant of right breast in female, estrogen receptor positive ([place]) ?Staging form: Breast, AJCC 8th Edition ?- Clinical: Stage IB (cT1c, cN0, cM0, G3, ER+, PR-, HER2-) - Unsigned ?Stage prefix: Initial diagnosis ?Method of lymph node assessment: Clinical ?Histologic grading system: 3 grade system ?- Pathologic: Stage IIA (pT2, pN0, cM0, G3, ER+, PR-, HER2-) - Signed by Gardenia Phlegm, NP on 11/27/2021 ?Histologic grading system: 3 grade system ? ? ?SUMMARY OF ONCOLOGIC HISTORY: ?Oncology History  ?Malignant neoplasm of upper-outer quadrant of right breast in female, estrogen receptor positive ([place])  ?08/08/2021 Imaging  ? August 08, 2021, patient had bilateral screening mammogram which showed 2 possible masses in the right breast which warrant further evaluation.  No concerning findings in the left breast. ?She had diagnostic mammogram on 09/13/2021 which noted possible mass in the upper central breast at middle depth which persists on additional views is 1.9 cm oval mass with indistinct margins.  Second previously noted possible mass in the upper outer breast at anterior to middle depth dissipates on additional views consistent with overlapping fibroglandular tissue. ?Ultrasound showed suspicious 1.9 cm mass at the right breast 12 o'clock position for which ultrasound-guided biopsy was recommended no right axillary lymphadenopathy. ?  ?09/25/2021 Initial Diagnosis  ? Malignant neoplasm of upper-outer quadrant of right breast in female, estrogen receptor positive ([place]) ?  ? Genetic Testing  ? [facility] CancerNext-Expanded Panel was Negative. 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 Report date is 10/20/2021. ? ?The CancerNext-Expanded gene panel offered by [facility] and includes  sequencing, rearrangement, and RNA analysis for the following 77 genes: AIP, ALK, APC, ATM, AXIN2, BAP1, BARD1, BLM, BMPR1A, BRCA1, BRCA2, BRIP1, CDC73, CDH1, CDK4, CDKN1B, CDKN2A, CHEK2, CTNNA1, DICER1, FANCC, FH, FLCN, GALNT12, KIF1B, LZTR1, MAX, MEN1, MET, MLH1, MSH2, MSH3, MSH6, MUTYH, NBN, NF1, NF2, NTHL1, PALB2, PHOX2B, PMS2, POT1, PRKAR1A, PTCH1, PTEN, RAD51C, RAD51D, RB1, RECQL, RET, SDHA, SDHAF2, SDHB, SDHC, SDHD, SMAD4, SMARCA4, SMARCB1, SMARCE1, STK11, SUFU, TMEM127, TP53, TSC1, TSC2, VHL and XRCC2 (sequencing and deletion/duplication); EGFR, EGLN1, HOXB13, KIT, MITF, PDGFRA, POLD1, and POLE (sequencing only); EPCAM and GREM1 (deletion/duplication only).  ?  ?10/06/2021 Surgery  ? A. BREAST, RIGHT, LUMPECTOMY:  ?-  Invasive carcinoma of no special type (ductal), grade3 (total  ?Nottingham score 9), 25 mm in greatest dimension.  ?-  Ductal carcinoma in situ, grade III (high), 0.3 cm in greatest  ?dimension.  ?Prognostics from initial biopsy showed ER +30%, weak staining, PR 0%, negative, Ki-67 of 90%.  HER2 negative by IHC. ? ? ?  ?11/06/2021 -  Chemotherapy  ? Patient is on Treatment Plan : BREAST TC q21d  ? ?  ?  ?11/27/2021 Cancer Staging  ? Staging form: Breast, AJCC 8th Edition ?- Pathologic: Stage IIA (pT2, pN0, cM0, G3, ER+, PR-, HER2-) - Signed by Gardenia Phlegm, NP on 11/27/2021 ?Histologic grading system: 3 grade system ? ?  ? ? ?CURRENT THERAPY: Taxotere/Cytoxan ? ?INTERVAL HISTORY: ?Susan Benjamin 66 y.o. female returns for evaluation prior to receiving her third cycle of taxotere and cytoxan. She denies any new complaints. ? ?She has been tolerating chemotherapy very well.  She has noticed some dysgeusia, not everything tastes good.  No neuropathy reported.  She has felt a bit of indigestion for which she uses  Zofran and this helps tremendously.  Intermittent constipation likely related to the nausea medications.  Some episodes of hypoglycemia again likely related to dexamethasone. ?Rest  of the pertinent 10 point ROS reviewed and negative ? ?Review of Systems  ?Constitutional:  Negative for chills and fever.  ?HENT:  Negative for hearing loss.   ?Respiratory:  Negative for cough and shortness of breath.   ?Cardiovascular:  Negative for chest pain and palpitations.  ?Gastrointestinal:  Negative for abdominal pain, constipation, diarrhea, nausea and vomiting.  ?Skin:  Negative for itching and rash.  ?Neurological:  Negative for dizziness and headaches.  ?Endo/Heme/Allergies:  Does not bruise/bleed easily.  ?Psychiatric/Behavioral:  Negative for depression. The patient is not nervous/anxious.   ? ? ? ?Patient Active Problem List  ? Diagnosis Date Noted  ? Muscle cramps 01/09/2022  ? Normocytic normochromic anemia 01/09/2022  ? Dysgeusia 12/19/2021  ? Port-A-Cath in place 12/18/2021  ? Genetic testing 10/09/2021  ? Malignant neoplasm of upper-outer quadrant of right breast in female, estrogen receptor positive ([place]) 09/25/2021  ? Benign colon polyp 10/10/2012  ? ? ?has No Known Allergies. ? ?MEDICAL HISTORY: ?Past Medical History:  ?Diagnosis Date  ? Anxiety   ? Cancer [facility])   ? CIN I (cervical intraepithelial neoplasia I)   ? Ectopic pregnancy   ? X 2  ? Endometrial polyp   ? PONV (postoperative nausea and vomiting)   ? Seasonal allergies   ? ? ?SURGICAL HISTORY: ?Past Surgical History:  ?Procedure Laterality Date  ? BREAST LUMPECTOMY WITH RADIOACTIVE SEED AND SENTINEL LYMPH NODE BIOPSY Right 10/06/2021  ? Procedure: RIGHT BREAST LUMPECTOMY WITH RADIOACTIVE SEED AND SENTINEL LYMPH NODE BIOPSY;  Surgeon: Donnie Mesa, MD;  Location: [facility];  Service: General;  Laterality: Right;  ? CARPAL TUNNEL RELEASE    ? CERVICAL BIOPSY  W/ LOOP ELECTRODE EXCISION  2003  ? DILATION AND CURETTAGE OF UTERUS  2011  ? ECTOPIC PREGNANCY SURGERY    ? X 2-Right and Left salpingectomy  ? FOOT SURGERY    ? HYSTEROSCOPY  2011  ? endometrial polyp  ? PORTACATH PLACEMENT Right 10/06/2021  ? Procedure: INSERTION  PORT-A-CATH;  Surgeon: Donnie Mesa, MD;  Location: [facility];  Service: General;  Laterality: Right;  ? TUBAL LIGATION    ? tubal lig and tubal reversal  ? VAGINAL HYSTERECTOMY  2012  ? ? ?SOCIAL HISTORY: ?Social History  ? ?Socioeconomic History  ? Marital status: Married  ?  Spouse name: Not on file  ? Number of children: Not on file  ? Years of education: Not on file  ? Highest education level: Not on file  ?Occupational History  ? Not on file  ?Tobacco Use  ? Smoking status: Never  ? Smokeless tobacco: Never  ?Vaping Use  ? Vaping Use: Never used  ?Substance and Sexual Activity  ? Alcohol use: Not Currently  ?  Comment: rarely  ? Drug use: No  ? Sexual activity: Yes  ?  Birth control/protection: Surgical  ?  Comment: INTERCOURSE AGE 16, SEXUAL PARTNERS LESS THAN 5  ?Other Topics Concern  ? Not on file  ?Social History Narrative  ? Not on file  ? ?Social Determinants of Health  ? ?Financial Resource Strain: Not on file  ?Food Insecurity: Not on file  ?Transportation Needs: Not on file  ?Physical Activity: Not on file  ?Stress: Not on file  ?Social Connections: Not on file  ?Intimate Partner Violence: Not on file  ? ? ?FAMILY  HISTORY: ?Family History  ?Problem Relation Age of Onset  ? Diabetes Mother   ? Hypertension Mother   ? Stroke Mother   ? Heart disease Mother   ? Hypertension Father   ? Melanoma Father 52  ?     metastasized  ? Breast cancer Cousin   ?     dx. 49s  ? ? ? ?PHYSICAL EXAMINATION ? ?ECOG PERFORMANCE STATUS: 1 - Symptomatic but completely ambulatory ? ?Vitals:  ? 01/09/22 0927  ?BP: (!) 149/74  ?Pulse: 88  ?Resp: 16  ?Temp: 97.9 ?F (36.6 ?C)  ?SpO2: 98%  ? ? ?Physical Exam ?Constitutional:   ?   General: She is not in acute distress. ?   Appearance: Normal appearance. She is not toxic-appearing.  ?HENT:  ?   Head: Normocephalic and atraumatic.  ?Eyes:  ?   General: No scleral icterus. ?Cardiovascular:  ?   Rate and Rhythm: Normal rate and regular rhythm.  ?   Pulses: Normal  pulses.  ?   Heart sounds: Normal heart sounds.  ?Pulmonary:  ?   Effort: Pulmonary effort is normal.  ?   Breath sounds: Normal breath sounds.  ?Abdominal:  ?   General: Abdomen is flat. Bowel sounds

## 2022-01-09 NOTE — Assessment & Plan Note (Signed)
Normocytic normochromic anemia.  No indication for transfusion.  Okay to proceed with chemotherapy as planned ?

## 2022-01-09 NOTE — Assessment & Plan Note (Addendum)
This is a very pleasant 66 year old postmenopausal female patient with no significant past medical history referred to medical oncology for new diagnosis of right breast invasive ductal carcinoma. ?We have discussed about consideration for adjuvant chemotherapy given her tumor with high grade, high proliferation index of 90% on the original specimen, The tumor has many unfavorable features, including grade 3, ER weakly positive, PR negative, Ki 90%. We discussed about oncotype testing however given several unfavorable features, she is now receiving adjuvant docetaxel and cyclophosphamide. ? ?She is here for cycle 4-day 1 of docetaxel and cyclophosphamide. ?She is tolerating this very well except for some muscle cramps. ?No concerning physical examination findings.  CBC reviewed and satisfactory.  CMP pending at the time of my visit.  Okay to proceed with chemotherapy as planned today if labs are all within parameters.  I have sent an in basket message to Dr. Lisbeth Renshaw from radiation oncology about adjuvant radiation.  She also wants her port removed in a few weeks, message Dr. Georgette Dover. ? ?

## 2022-01-10 ENCOUNTER — Telehealth: Payer: Self-pay | Admitting: *Deleted

## 2022-01-10 NOTE — Progress Notes (Signed)
?Radiation Oncology         (336) [phone] ?________________________________ ? ?Name: Susan Benjamin        MRN: [id number]  ?Date of Service: 01/11/2022 DOB: May 10, 1956 ? ?TD:DUKGU, Herbie [place], MD  Benay Pike, MD    ? ?REFERRING PHYSICIAN: Benay Pike, MD ? ? ?DIAGNOSIS: The encounter diagnosis was Malignant neoplasm of upper-outer quadrant of right breast in female, estrogen receptor positive ([place]). ? ? ?HISTORY OF PRESENT ILLNESS: Susan Benjamin is a 66 y.o. female with a diagnosis of right breast cancer.  The patient was found to have screening detected mass in the 12 o'clock position that by diagnostic imaging measured up to 1.9 cm.  No evidence of right axillary adenopathy was seen.  She underwent a biopsy on 09/19/2021 that showed a grade 3 invasive ductal carcinoma with necrosis.  Her tumor was ER positive with weak staining intensity at 30%, PR negative and HER2 negative.  Ki-67 was 90%.  She underwent right lumpectomy with sentinel lymph node biopsy on 10/06/2021 which revealed grade 3 invasive ductal carcinoma measuring 2.5 cm with associated high-grade DCIS.  All of her margins were negative but her invasive margin was 1 mm to the inferior, and 2 mm to the medial margin.  3 sampled lymph nodes were all negative for disease.  She went on to receive systemic chemotherapy between 11/06/2021 and 01/09/2022.  She is seen to discuss adjuvant radiotherapy. ? ?PREVIOUS RADIATION THERAPY: No ? ? ?PAST MEDICAL HISTORY:  ?Past Medical History:  ?Diagnosis Date  ? Anxiety   ? Cancer [facility])   ? CIN I (cervical intraepithelial neoplasia I)   ? Ectopic pregnancy   ? X 2  ? Endometrial polyp   ? PONV (postoperative nausea and vomiting)   ? Seasonal allergies   ?   ? ? ?PAST SURGICAL HISTORY: ?Past Surgical History:  ?Procedure Laterality Date  ? BREAST LUMPECTOMY WITH RADIOACTIVE SEED AND SENTINEL LYMPH NODE BIOPSY Right 10/06/2021  ? Procedure: RIGHT BREAST LUMPECTOMY WITH RADIOACTIVE SEED AND SENTINEL LYMPH NODE BIOPSY;   Surgeon: Donnie Mesa, MD;  Location: [place];  Service: General;  Laterality: Right;  ? CARPAL TUNNEL RELEASE    ? CERVICAL BIOPSY  W/ LOOP ELECTRODE EXCISION  2003  ? DILATION AND CURETTAGE OF UTERUS  2011  ? ECTOPIC PREGNANCY SURGERY    ? X 2-Right and Left salpingectomy  ? FOOT SURGERY    ? HYSTEROSCOPY  2011  ? endometrial polyp  ? PORTACATH PLACEMENT Right 10/06/2021  ? Procedure: INSERTION PORT-A-CATH;  Surgeon: Donnie Mesa, MD;  Location: [place];  Service: General;  Laterality: Right;  ? TUBAL LIGATION    ? tubal lig and tubal reversal  ? VAGINAL HYSTERECTOMY  2012  ? ? ? ?FAMILY HISTORY:  ?Family History  ?Problem Relation Age of Onset  ? Diabetes Mother   ? Hypertension Mother   ? Stroke Mother   ? Heart disease Mother   ? Hypertension Father   ? Melanoma Father 37  ?     metastasized  ? Breast cancer Cousin   ?     dx. 29s  ? ? ? ?SOCIAL HISTORY:  reports that she has never smoked. She has never used smokeless tobacco. She reports that she does not currently use alcohol. She reports that she does not use drugs. She is married and lives in [place].   ? ? ?ALLERGIES: Patient has no known allergies. ? ? ?MEDICATIONS:  ?Current Outpatient Medications  ?Medication  Sig Dispense Refill  ? Baclofen 5 MG TABS Take 5 mg by mouth 3 (three) times daily as needed. 6 tablet 0  ? dexamethasone (DECADRON) 4 MG tablet Take 2 tablets (8 mg total) by mouth 2 (two) times daily. Start the day before Taxotere. Then again the day after chemo for 3 days. 30 tablet 1  ? lidocaine-prilocaine (EMLA) cream Apply to affected area once 30 g 3  ? magic mouthwash (nystatin, lidocaine, diphenhydrAMINE, alum & mag hydroxide) suspension Swish 5 mL in mouth and swallow four times daily as needed. 240 mL 1  ? magic mouthwash w/lidocaine SOLN Take 5 mLs by mouth 4 (four) times daily as needed for mouth pain. 240 mL 1  ? ondansetron (ZOFRAN-ODT) 8 MG disintegrating tablet Take 1 tablet (8 mg total) by  mouth every 8 (eight) hours as needed for nausea or vomiting. 20 tablet 3  ? prochlorperazine (COMPAZINE) 10 MG tablet Take 1 tablet (10 mg total) by mouth every 6 (six) hours as needed (Nausea or vomiting). 30 tablet 1  ? ?No current facility-administered medications for this visit.  ? ? ? ?REVIEW OF SYSTEMS: On review of systems, the patient reports that she is doing well. She tolerated chemotherapy well and is ready to have her PAC removed and move along with radiation. No specific complaints are otherwise verbalized.  ? ?  ? ?PHYSICAL EXAM:  ?Wt Readings from Last 3 Encounters:  ?01/09/22 167 lb 3.2 oz (75.8 kg)  ?01/08/22 166 lb (75.3 kg)  ?12/19/21 166 lb 11.2 oz (75.6 kg)  ? ?Temp Readings from Last 3 Encounters:  ?01/09/22 97.9 ?F (36.6 ?C) (Temporal)  ?12/21/21 97.9 ?F (36.6 ?C) (Oral)  ?12/19/21 97.9 ?F (36.6 ?C) (Temporal)  ? ?BP Readings from Last 3 Encounters:  ?01/09/22 (!) 149/74  ?12/21/21 (!) 154/76  ?12/19/21 (!) 152/73  ? ?Pulse Readings from Last 3 Encounters:  ?01/09/22 88  ?12/21/21 66  ?12/19/21 88  ? ? ?In general this is a well appearing caucasian female in no acute distress. She's alert and oriented x4 and appropriate throughout the examination. Cardiopulmonary assessment is negative for acute distress and she exhibits normal effort.  Her right breast is well-healed with a intact incision of the breast and axilla without separation or drainage. ? ? ? ?ECOG = 0 ? ?0 - Asymptomatic (Fully active, able to carry on all predisease activities without restriction) ? ?1 - Symptomatic but completely ambulatory (Restricted in physically strenuous activity but ambulatory and able to carry out work of a light or sedentary nature. For example, light housework, office work) ? ?2 - Symptomatic, <50% in bed during the day (Ambulatory and capable of all self care but unable to carry out any work activities. Up and about more than 50% of waking hours) ? ?3 - Symptomatic, >50% in bed, but not bedbound  (Capable of only limited self-care, confined to bed or chair 50% or more of waking hours) ? ?4 - Bedbound (Completely disabled. Cannot carry on any self-care. Totally confined to bed or chair) ? ?5 - Death ? ? Oken MM, Creech RH, Tormey DC, et al. [phone]). "Toxicity and response criteria of the [facility]". [place] Oncol. 5 (6): 649-55 ? ? ? ?LABORATORY DATA:  ?Lab Results  ?Component Value Date  ? WBC 8.5 01/09/2022  ? HGB 10.7 (L) 01/09/2022  ? HCT 31.5 (L) 01/09/2022  ? MCV 91.6 01/09/2022  ? PLT 382 01/09/2022  ? ?Lab Results  ?Component Value Date  ?  NA 136 01/09/2022  ? K 3.8 01/09/2022  ? CL 104 01/09/2022  ? CO2 21 (L) 01/09/2022  ? ?Lab Results  ?Component Value Date  ? ALT 19 01/09/2022  ? AST 19 01/09/2022  ? ALKPHOS 58 01/09/2022  ? BILITOT 0.6 01/09/2022  ? ?  ? ?RADIOGRAPHY: No results found. ?   ? ?IMPRESSION/PLAN: ?1. Stage IIA, pT2N0M0, functionally triple negative, grade 3 invasive ductal carcinoma of the right breast. Dr. Lisbeth Renshaw has reviewed her final pathology findings and reviewed her course to date.  She has tolerated systemic therapy well.  I reviewed the rationale for external radiotherapy to the breast  to reduce risks of local recurrence likely followed by antiestrogen therapy. We discussed the risks, benefits, short, and long term effects of radiotherapy, as well as the curative intent, and the patient is interested in proceeding.  I discussed the delivery and logistics of radiotherapy and Dr. Lisbeth Renshaw recommends 4 weeks of radiotherapy to the right breast. Written consent is obtained and placed in the chart, a copy was provided to the patient. The patient will be contacted to coordinate treatment planning by our simulation department.  We anticipate her treatment starting in approximately 4 weeks. ?2. Right PAC. She will discuss removal with Dr. Georgette Dover, but she knows we would prefer 2 weeks of healing prior to proceeding with radiation.  ? ?In a visit lasting 45  minutes, greater than 50% of the time was spent face to face discussing the patient's condition, in preparation for the discussion, and coordinating the patient's care. I was remotely on Webex along with Dr. Tommy Medal

## 2022-01-11 ENCOUNTER — Ambulatory Visit
Admission: RE | Admit: 2022-01-11 | Discharge: 2022-01-11 | Disposition: A | Discharge status: Home / Self Care | Payer: BC Managed Care – PPO | Source: Ambulatory Visit | Attending: Radiation Oncology | Admitting: Radiation Oncology

## 2022-01-11 ENCOUNTER — Encounter: Payer: Self-pay | Admitting: Radiation Oncology

## 2022-01-11 ENCOUNTER — Ambulatory Visit: Payer: Self-pay | Admitting: Surgery

## 2022-01-11 ENCOUNTER — Other Ambulatory Visit: Payer: Self-pay

## 2022-01-11 ENCOUNTER — Inpatient Hospital Stay: Payer: BC Managed Care – PPO

## 2022-01-11 VITALS — BP 131/67 | HR 65 | Temp 97.9°F | Resp 19 | Ht 64.0 in | Wt 167.0 lb

## 2022-01-11 VITALS — BP 135/80 | HR 63 | Temp 98.4°F | Resp 20

## 2022-01-11 DIAGNOSIS — C50411 Malignant neoplasm of upper-outer quadrant of right female breast: Secondary | ICD-10-CM

## 2022-01-11 DIAGNOSIS — Z803 Family history of malignant neoplasm of breast: Secondary | ICD-10-CM | POA: Diagnosis not present

## 2022-01-11 DIAGNOSIS — Z808 Family history of malignant neoplasm of other organs or systems: Secondary | ICD-10-CM | POA: Insufficient documentation

## 2022-01-11 DIAGNOSIS — Z7952 Long term (current) use of systemic steroids: Secondary | ICD-10-CM | POA: Insufficient documentation

## 2022-01-11 DIAGNOSIS — Z923 Personal history of irradiation: Secondary | ICD-10-CM | POA: Insufficient documentation

## 2022-01-11 DIAGNOSIS — Z170 Estrogen receptor positive status [ER+]: Secondary | ICD-10-CM | POA: Diagnosis not present

## 2022-01-11 DIAGNOSIS — Z5111 Encounter for antineoplastic chemotherapy: Secondary | ICD-10-CM | POA: Diagnosis not present

## 2022-01-11 MED ORDER — PEGFILGRASTIM-CBQV 6 MG/0.6ML ~~LOC~~ SOSY
6.0000 mg | PREFILLED_SYRINGE | Freq: Once | SUBCUTANEOUS | Status: AC
  Administered 2022-01-11: 6 mg via SUBCUTANEOUS
  Filled 2022-01-11: qty 0.6

## 2022-01-11 NOTE — Progress Notes (Signed)
Follow up new appointment. I verified patient identity and began nursing interview. Patient is doing well. No issues RT breast issues reported at this time. ? ?Meaningful use complete. ?Hysterectomy- NO chances of pregnancy. ? ? ? ?

## 2022-01-11 NOTE — Patient Instructions (Signed)

## 2022-01-16 ENCOUNTER — Other Ambulatory Visit: Payer: Self-pay | Admitting: Hematology and Oncology

## 2022-01-16 MED ORDER — BACLOFEN 5 MG PO TABS: 5.0000 mg | ORAL_TABLET | Freq: Three times a day (TID) | ORAL | 0 refills | Status: DC | PRN

## 2022-01-18 ENCOUNTER — Encounter: Payer: Self-pay | Admitting: *Deleted

## 2022-02-02 ENCOUNTER — Ambulatory Visit
Admission: RE | Admit: 2022-02-02 | Discharge: 2022-02-02 | Disposition: A | Discharge status: Home / Self Care | Payer: BC Managed Care – PPO | Source: Ambulatory Visit | Attending: Radiation Oncology | Admitting: Radiation Oncology

## 2022-02-02 ENCOUNTER — Other Ambulatory Visit: Payer: Self-pay

## 2022-02-02 DIAGNOSIS — C50411 Malignant neoplasm of upper-outer quadrant of right female breast: Secondary | ICD-10-CM | POA: Insufficient documentation

## 2022-02-02 DIAGNOSIS — Z170 Estrogen receptor positive status [ER+]: Secondary | ICD-10-CM | POA: Diagnosis not present

## 2022-02-02 DIAGNOSIS — Z510 Encounter for antineoplastic radiation therapy: Secondary | ICD-10-CM | POA: Diagnosis present

## 2022-02-09 ENCOUNTER — Encounter: Payer: Self-pay | Admitting: *Deleted

## 2022-02-09 DIAGNOSIS — C50411 Malignant neoplasm of upper-outer quadrant of right female breast: Secondary | ICD-10-CM | POA: Diagnosis not present

## 2022-02-13 ENCOUNTER — Ambulatory Visit
Admission: RE | Admit: 2022-02-13 | Discharge: 2022-02-13 | Disposition: A | Discharge status: Home / Self Care | Payer: BC Managed Care – PPO | Source: Ambulatory Visit | Attending: Radiation Oncology | Admitting: Radiation Oncology

## 2022-02-13 ENCOUNTER — Other Ambulatory Visit: Payer: Self-pay

## 2022-02-13 DIAGNOSIS — C50411 Malignant neoplasm of upper-outer quadrant of right female breast: Secondary | ICD-10-CM | POA: Diagnosis not present

## 2022-02-13 LAB — RAD ONC ARIA SESSION SUMMARY
Course Elapsed Days: 0
Plan Fractions Treated to Date: 1
Plan Prescribed Dose Per Fraction: 2.66 Gy
Plan Total Fractions Prescribed: 16
Plan Total Prescribed Dose: 42.56 Gy
Reference Point Dosage Given to Date: 2.66 Gy
Reference Point Session Dosage Given: 2.66 Gy
Session Number: 1

## 2022-02-14 ENCOUNTER — Other Ambulatory Visit: Payer: Self-pay

## 2022-02-14 ENCOUNTER — Ambulatory Visit
Admission: RE | Admit: 2022-02-14 | Discharge: 2022-02-14 | Disposition: A | Discharge status: Home / Self Care | Payer: BC Managed Care – PPO | Source: Ambulatory Visit | Attending: Radiation Oncology | Admitting: Radiation Oncology

## 2022-02-14 DIAGNOSIS — C50411 Malignant neoplasm of upper-outer quadrant of right female breast: Secondary | ICD-10-CM | POA: Diagnosis not present

## 2022-02-14 LAB — RAD ONC ARIA SESSION SUMMARY
Course Elapsed Days: 1
Plan Fractions Treated to Date: 2
Plan Prescribed Dose Per Fraction: 2.66 Gy
Plan Total Fractions Prescribed: 16
Plan Total Prescribed Dose: 42.56 Gy
Reference Point Dosage Given to Date: 5.32 Gy
Reference Point Session Dosage Given: 2.66 Gy
Session Number: 2

## 2022-02-15 ENCOUNTER — Other Ambulatory Visit: Payer: Self-pay

## 2022-02-15 ENCOUNTER — Ambulatory Visit
Admission: RE | Admit: 2022-02-15 | Discharge: 2022-02-15 | Disposition: A | Discharge status: Home / Self Care | Payer: BC Managed Care – PPO | Source: Ambulatory Visit | Attending: Radiation Oncology | Admitting: Radiation Oncology

## 2022-02-15 DIAGNOSIS — C50411 Malignant neoplasm of upper-outer quadrant of right female breast: Secondary | ICD-10-CM | POA: Diagnosis not present

## 2022-02-15 LAB — RAD ONC ARIA SESSION SUMMARY
Course Elapsed Days: 2
Plan Fractions Treated to Date: 3
Plan Prescribed Dose Per Fraction: 2.66 Gy
Plan Total Fractions Prescribed: 16
Plan Total Prescribed Dose: 42.56 Gy
Reference Point Dosage Given to Date: 7.98 Gy
Reference Point Session Dosage Given: 2.66 Gy
Session Number: 3

## 2022-02-15 NOTE — Progress Notes (Signed)
Pt here for patient teaching.  Pt given Radiation and You booklet, skin care instructions, alra deodorant and Radiaplex.    Reviewed areas of pertinence such as fatigue, hair loss, skin changes, breast tenderness, and breast swelling. Pt able to give teach back of to pat skin and use unscented/gentle soap,apply Radiaplex bid, avoid applying anything to skin within 4 hours of treatment, avoid wearing an under wire bra, and to use an electric razor if they must shave. Pt verbalizes understanding of information given and will contact nursing with any questions or concerns.    Romell Wolden M. Greta Yung RN, BSN  

## 2022-02-16 ENCOUNTER — Other Ambulatory Visit: Payer: Self-pay

## 2022-02-16 ENCOUNTER — Ambulatory Visit
Admission: RE | Admit: 2022-02-16 | Discharge: 2022-02-16 | Disposition: A | Discharge status: Home / Self Care | Payer: BC Managed Care – PPO | Source: Ambulatory Visit | Attending: Radiation Oncology | Admitting: Radiation Oncology

## 2022-02-16 DIAGNOSIS — C50411 Malignant neoplasm of upper-outer quadrant of right female breast: Secondary | ICD-10-CM | POA: Diagnosis not present

## 2022-02-16 DIAGNOSIS — Z170 Estrogen receptor positive status [ER+]: Secondary | ICD-10-CM

## 2022-02-16 LAB — RAD ONC ARIA SESSION SUMMARY
Course Elapsed Days: 3
Plan Fractions Treated to Date: 4
Plan Prescribed Dose Per Fraction: 2.66 Gy
Plan Total Fractions Prescribed: 16
Plan Total Prescribed Dose: 42.56 Gy
Reference Point Dosage Given to Date: 10.64 Gy
Reference Point Session Dosage Given: 2.66 Gy
Session Number: 4

## 2022-02-16 MED ORDER — ALRA NON-METALLIC DEODORANT (RAD-ONC)
1.0000 | Freq: Once | TOPICAL | Status: AC
  Administered 2022-02-16: 1 via TOPICAL

## 2022-02-16 MED ORDER — RADIAPLEXRX EX GEL: Freq: Once | CUTANEOUS | Status: AC

## 2022-02-19 ENCOUNTER — Other Ambulatory Visit: Payer: Self-pay

## 2022-02-19 ENCOUNTER — Ambulatory Visit
Admission: RE | Admit: 2022-02-19 | Discharge: 2022-02-19 | Disposition: A | Discharge status: Home / Self Care | Payer: BC Managed Care – PPO | Source: Ambulatory Visit | Attending: Radiation Oncology | Admitting: Radiation Oncology

## 2022-02-19 DIAGNOSIS — C50411 Malignant neoplasm of upper-outer quadrant of right female breast: Secondary | ICD-10-CM | POA: Diagnosis not present

## 2022-02-19 LAB — RAD ONC ARIA SESSION SUMMARY
Course Elapsed Days: 6
Plan Fractions Treated to Date: 5
Plan Prescribed Dose Per Fraction: 2.66 Gy
Plan Total Fractions Prescribed: 16
Plan Total Prescribed Dose: 42.56 Gy
Reference Point Dosage Given to Date: 13.3 Gy
Reference Point Session Dosage Given: 2.66 Gy
Session Number: 5

## 2022-02-20 ENCOUNTER — Ambulatory Visit
Admission: RE | Admit: 2022-02-20 | Discharge: 2022-02-20 | Disposition: A | Discharge status: Home / Self Care | Payer: BC Managed Care – PPO | Source: Ambulatory Visit | Attending: Radiation Oncology | Admitting: Radiation Oncology

## 2022-02-20 ENCOUNTER — Other Ambulatory Visit: Payer: Self-pay

## 2022-02-20 DIAGNOSIS — C50411 Malignant neoplasm of upper-outer quadrant of right female breast: Secondary | ICD-10-CM | POA: Diagnosis not present

## 2022-02-20 LAB — RAD ONC ARIA SESSION SUMMARY
Course Elapsed Days: 7
Plan Fractions Treated to Date: 6
Plan Prescribed Dose Per Fraction: 2.66 Gy
Plan Total Fractions Prescribed: 16
Plan Total Prescribed Dose: 42.56 Gy
Reference Point Dosage Given to Date: 15.96 Gy
Reference Point Session Dosage Given: 2.66 Gy
Session Number: 6

## 2022-02-21 ENCOUNTER — Other Ambulatory Visit: Payer: Self-pay

## 2022-02-21 ENCOUNTER — Ambulatory Visit
Admission: RE | Admit: 2022-02-21 | Discharge: 2022-02-21 | Disposition: A | Discharge status: Home / Self Care | Payer: BC Managed Care – PPO | Source: Ambulatory Visit | Attending: Radiation Oncology | Admitting: Radiation Oncology

## 2022-02-21 DIAGNOSIS — C50411 Malignant neoplasm of upper-outer quadrant of right female breast: Secondary | ICD-10-CM | POA: Diagnosis not present

## 2022-02-21 LAB — RAD ONC ARIA SESSION SUMMARY
Course Elapsed Days: 8
Plan Fractions Treated to Date: 7
Plan Prescribed Dose Per Fraction: 2.66 Gy
Plan Total Fractions Prescribed: 16
Plan Total Prescribed Dose: 42.56 Gy
Reference Point Dosage Given to Date: 18.62 Gy
Reference Point Session Dosage Given: 2.66 Gy
Session Number: 7

## 2022-02-22 ENCOUNTER — Other Ambulatory Visit: Payer: Self-pay

## 2022-02-22 ENCOUNTER — Ambulatory Visit
Admission: RE | Admit: 2022-02-22 | Discharge: 2022-02-22 | Disposition: A | Discharge status: Home / Self Care | Payer: BC Managed Care – PPO | Source: Ambulatory Visit | Attending: Radiation Oncology | Admitting: Radiation Oncology

## 2022-02-22 DIAGNOSIS — C50411 Malignant neoplasm of upper-outer quadrant of right female breast: Secondary | ICD-10-CM | POA: Diagnosis not present

## 2022-02-22 LAB — RAD ONC ARIA SESSION SUMMARY
Course Elapsed Days: 9
Plan Fractions Treated to Date: 8
Plan Prescribed Dose Per Fraction: 2.66 Gy
Plan Total Fractions Prescribed: 16
Plan Total Prescribed Dose: 42.56 Gy
Reference Point Dosage Given to Date: 21.28 Gy
Reference Point Session Dosage Given: 2.66 Gy
Session Number: 8

## 2022-02-23 ENCOUNTER — Other Ambulatory Visit: Payer: Self-pay

## 2022-02-23 ENCOUNTER — Ambulatory Visit
Admission: RE | Admit: 2022-02-23 | Discharge: 2022-02-23 | Disposition: A | Discharge status: Home / Self Care | Payer: BC Managed Care – PPO | Source: Ambulatory Visit | Attending: Radiation Oncology | Admitting: Radiation Oncology

## 2022-02-23 DIAGNOSIS — C50411 Malignant neoplasm of upper-outer quadrant of right female breast: Secondary | ICD-10-CM | POA: Diagnosis not present

## 2022-02-23 LAB — RAD ONC ARIA SESSION SUMMARY
Course Elapsed Days: 10
Plan Fractions Treated to Date: 9
Plan Prescribed Dose Per Fraction: 2.66 Gy
Plan Total Fractions Prescribed: 16
Plan Total Prescribed Dose: 42.56 Gy
Reference Point Dosage Given to Date: 23.94 Gy
Reference Point Session Dosage Given: 2.66 Gy
Session Number: 9

## 2022-02-26 ENCOUNTER — Other Ambulatory Visit: Payer: Self-pay

## 2022-02-26 ENCOUNTER — Ambulatory Visit
Admission: RE | Admit: 2022-02-26 | Discharge: 2022-02-26 | Disposition: A | Discharge status: Home / Self Care | Payer: BC Managed Care – PPO | Source: Ambulatory Visit | Attending: Radiation Oncology | Admitting: Radiation Oncology

## 2022-02-26 DIAGNOSIS — C50411 Malignant neoplasm of upper-outer quadrant of right female breast: Secondary | ICD-10-CM | POA: Diagnosis not present

## 2022-02-26 LAB — RAD ONC ARIA SESSION SUMMARY
Course Elapsed Days: 13
Plan Fractions Treated to Date: 10
Plan Prescribed Dose Per Fraction: 2.66 Gy
Plan Total Fractions Prescribed: 16
Plan Total Prescribed Dose: 42.56 Gy
Reference Point Dosage Given to Date: 26.6 Gy
Reference Point Session Dosage Given: 2.66 Gy
Session Number: 10

## 2022-02-27 ENCOUNTER — Other Ambulatory Visit: Payer: Self-pay

## 2022-02-27 ENCOUNTER — Ambulatory Visit
Admission: RE | Admit: 2022-02-27 | Discharge: 2022-02-27 | Disposition: A | Discharge status: Home / Self Care | Payer: BC Managed Care – PPO | Source: Ambulatory Visit | Attending: Radiation Oncology | Admitting: Radiation Oncology

## 2022-02-27 DIAGNOSIS — C50411 Malignant neoplasm of upper-outer quadrant of right female breast: Secondary | ICD-10-CM | POA: Diagnosis not present

## 2022-02-27 LAB — RAD ONC ARIA SESSION SUMMARY
Course Elapsed Days: 14
Plan Fractions Treated to Date: 11
Plan Prescribed Dose Per Fraction: 2.66 Gy
Plan Total Fractions Prescribed: 16
Plan Total Prescribed Dose: 42.56 Gy
Reference Point Dosage Given to Date: 29.26 Gy
Reference Point Session Dosage Given: 2.66 Gy
Session Number: 11

## 2022-02-28 ENCOUNTER — Other Ambulatory Visit: Payer: Self-pay

## 2022-02-28 ENCOUNTER — Ambulatory Visit
Admission: RE | Admit: 2022-02-28 | Discharge: 2022-02-28 | Disposition: A | Discharge status: Home / Self Care | Payer: BC Managed Care – PPO | Source: Ambulatory Visit | Attending: Radiation Oncology | Admitting: Radiation Oncology

## 2022-02-28 DIAGNOSIS — C50411 Malignant neoplasm of upper-outer quadrant of right female breast: Secondary | ICD-10-CM | POA: Diagnosis not present

## 2022-02-28 LAB — RAD ONC ARIA SESSION SUMMARY
Course Elapsed Days: 15
Plan Fractions Treated to Date: 12
Plan Prescribed Dose Per Fraction: 2.66 Gy
Plan Total Fractions Prescribed: 16
Plan Total Prescribed Dose: 42.56 Gy
Reference Point Dosage Given to Date: 31.92 Gy
Reference Point Session Dosage Given: 2.66 Gy
Session Number: 12

## 2022-03-01 ENCOUNTER — Ambulatory Visit
Admission: RE | Admit: 2022-03-01 | Discharge: 2022-03-01 | Disposition: A | Discharge status: Home / Self Care | Payer: BC Managed Care – PPO | Source: Ambulatory Visit | Attending: Radiation Oncology | Admitting: Radiation Oncology

## 2022-03-01 ENCOUNTER — Other Ambulatory Visit: Payer: Self-pay

## 2022-03-01 ENCOUNTER — Encounter: Payer: Self-pay | Admitting: Radiation Oncology

## 2022-03-01 DIAGNOSIS — C50411 Malignant neoplasm of upper-outer quadrant of right female breast: Secondary | ICD-10-CM | POA: Diagnosis not present

## 2022-03-01 LAB — RAD ONC ARIA SESSION SUMMARY
Course Elapsed Days: 16
Plan Fractions Treated to Date: 13
Plan Prescribed Dose Per Fraction: 2.66 Gy
Plan Total Fractions Prescribed: 16
Plan Total Prescribed Dose: 42.56 Gy
Reference Point Dosage Given to Date: 34.58 Gy
Reference Point Session Dosage Given: 2.66 Gy
Session Number: 13

## 2022-03-01 NOTE — Progress Notes (Signed)
  Radiation Oncology         [phone] ________________________________  Name: Susan Benjamin MRN: [id number]  Date: 03/01/2022  DOB: 01-13-1956  SIMULATION NOTE   NARRATIVE:  The patient underwent simulation today for ongoing radiation therapy.  The existing CT study set was employed for the purpose of virtual treatment planning.  The target and avoidance structures were reviewed and modified as necessary.  Treatment planning then occurred.  The radiation boost prescription was entered and confirmed.  A total of 3 complex treatment devices were fabricated in the form of multi-leaf collimators to shape radiation around the targets while maximally excluding nearby normal structures. I have requested : Isodose Plan.    PLAN:  This modified radiation beam arrangement is intended to continue the current radiation dose to an additional 8 Gy in 4 fractions for a total cumulative dose of 50.56 Gy.    ------------------------------------------------  Jodelle Gross, MD, PhD

## 2022-03-02 ENCOUNTER — Ambulatory Visit
Admission: RE | Admit: 2022-03-02 | Discharge: 2022-03-02 | Disposition: A | Discharge status: Home / Self Care | Payer: BC Managed Care – PPO | Source: Ambulatory Visit | Attending: Radiation Oncology | Admitting: Radiation Oncology

## 2022-03-02 ENCOUNTER — Ambulatory Visit: Payer: BC Managed Care – PPO | Admitting: Radiation Oncology

## 2022-03-02 ENCOUNTER — Other Ambulatory Visit: Payer: Self-pay

## 2022-03-02 DIAGNOSIS — C50411 Malignant neoplasm of upper-outer quadrant of right female breast: Secondary | ICD-10-CM | POA: Diagnosis not present

## 2022-03-02 LAB — RAD ONC ARIA SESSION SUMMARY
Course Elapsed Days: 17
Plan Fractions Treated to Date: 14
Plan Prescribed Dose Per Fraction: 2.66 Gy
Plan Total Fractions Prescribed: 16
Plan Total Prescribed Dose: 42.56 Gy
Reference Point Dosage Given to Date: 37.24 Gy
Reference Point Session Dosage Given: 2.66 Gy
Session Number: 14

## 2022-03-05 ENCOUNTER — Other Ambulatory Visit: Payer: Self-pay

## 2022-03-05 ENCOUNTER — Ambulatory Visit
Admission: RE | Admit: 2022-03-05 | Discharge: 2022-03-05 | Disposition: A | Discharge status: Home / Self Care | Payer: BC Managed Care – PPO | Source: Ambulatory Visit | Attending: Radiation Oncology | Admitting: Radiation Oncology

## 2022-03-05 DIAGNOSIS — C50411 Malignant neoplasm of upper-outer quadrant of right female breast: Secondary | ICD-10-CM | POA: Diagnosis present

## 2022-03-05 DIAGNOSIS — Z170 Estrogen receptor positive status [ER+]: Secondary | ICD-10-CM | POA: Diagnosis not present

## 2022-03-05 DIAGNOSIS — Z510 Encounter for antineoplastic radiation therapy: Secondary | ICD-10-CM | POA: Diagnosis present

## 2022-03-05 LAB — RAD ONC ARIA SESSION SUMMARY
Course Elapsed Days: 20
Plan Fractions Treated to Date: 15
Plan Prescribed Dose Per Fraction: 2.66 Gy
Plan Total Fractions Prescribed: 16
Plan Total Prescribed Dose: 42.56 Gy
Reference Point Dosage Given to Date: 39.9 Gy
Reference Point Session Dosage Given: 2.66 Gy
Session Number: 15

## 2022-03-07 ENCOUNTER — Other Ambulatory Visit: Payer: Self-pay

## 2022-03-07 ENCOUNTER — Ambulatory Visit
Admission: RE | Admit: 2022-03-07 | Discharge: 2022-03-07 | Disposition: A | Discharge status: Home / Self Care | Payer: BC Managed Care – PPO | Source: Ambulatory Visit | Attending: Radiation Oncology | Admitting: Radiation Oncology

## 2022-03-07 ENCOUNTER — Inpatient Hospital Stay: Payer: BC Managed Care – PPO | Attending: Hematology and Oncology | Admitting: Hematology and Oncology

## 2022-03-07 ENCOUNTER — Ambulatory Visit (HOSPITAL_COMMUNITY)
Admission: RE | Admit: 2022-03-07 | Discharge: 2022-03-07 | Disposition: A | Discharge status: Home / Self Care | Payer: BC Managed Care – PPO | Source: Ambulatory Visit | Attending: Hematology and Oncology | Admitting: Hematology and Oncology

## 2022-03-07 ENCOUNTER — Encounter: Payer: Self-pay | Admitting: Hematology and Oncology

## 2022-03-07 DIAGNOSIS — Z170 Estrogen receptor positive status [ER+]: Secondary | ICD-10-CM | POA: Insufficient documentation

## 2022-03-07 DIAGNOSIS — Z808 Family history of malignant neoplasm of other organs or systems: Secondary | ICD-10-CM | POA: Diagnosis not present

## 2022-03-07 DIAGNOSIS — Z803 Family history of malignant neoplasm of breast: Secondary | ICD-10-CM | POA: Diagnosis not present

## 2022-03-07 DIAGNOSIS — C50411 Malignant neoplasm of upper-outer quadrant of right female breast: Secondary | ICD-10-CM | POA: Insufficient documentation

## 2022-03-07 DIAGNOSIS — M545 Low back pain, unspecified: Secondary | ICD-10-CM | POA: Insufficient documentation

## 2022-03-07 LAB — RAD ONC ARIA SESSION SUMMARY
Course Elapsed Days: 22
Plan Fractions Treated to Date: 16
Plan Prescribed Dose Per Fraction: 2.66 Gy
Plan Total Fractions Prescribed: 16
Plan Total Prescribed Dose: 42.56 Gy
Reference Point Dosage Given to Date: 42.56 Gy
Reference Point Session Dosage Given: 2.66 Gy
Session Number: 16

## 2022-03-07 MED ORDER — ANASTROZOLE 1 MG PO TABS: 1.0000 mg | ORAL_TABLET | Freq: Every day | ORAL | 3 refills | Status: DC

## 2022-03-07 NOTE — Assessment & Plan Note (Addendum)
This is a very pleasant 66 year old postmenopausal female patient with no significant past medical history referred to medical oncology for new diagnosis of right breast invasive ductal carcinoma. We have discussed about consideration for adjuvant chemotherapy given her tumor with high grade, high proliferation index of 90% on the original specimen, The tumor has many unfavorable features, including grade 3, ER weakly positive, PR negative, Ki 90%. We discussed about oncotype testing however given several unfavorable features, she is now receiving adjuvant docetaxel and cyclophosphamide.  She completed adjuvant chemotherapy on 01/09/2022.  She has recovered well from chemotherapy, continues to however have fatigue and low back pain radiating down the left leg.  No physical examination findings.  She will be due for mammogram January 2024, this has been ordered.  Postradiation, she will start antiestrogen therapy given her weak ER positive tumor.  We have today once again discussed about role of tamoxifen versus aromatase inhibitors, mechanism of action for each class, adverse effects from each class.  We have decided to start her on anastrozole.  Baseline bone density scan has been ordered.  I have recommended her to complete radiation, wait for couple weeks and start antiestrogen therapy.  She will follow-up with our survivorship care clinic in 3 months which will be around November 2023.  For the back pain radiating down the left leg, we have discussed that this is likely unrelated to the cancer or chemotherapy.  This very much sounds like a sciatic nerve pain.  I have ordered an x-ray of her lumbar spine.  She was instructed to walk-in today to get this done.  If x-rays are unremarkable, she may want to follow-up about this with her PCP and she expressed understanding.

## 2022-03-07 NOTE — Progress Notes (Signed)
[facility] Follow up:    Susan Benjamin, [place] [address]   DIAGNOSIS:  Cancer Staging  Malignant neoplasm of upper-outer quadrant of right breast in female, estrogen receptor positive ([place]) Staging form: Breast, AJCC 8th Edition - Clinical: Stage IB (cT1c, cN0, cM0, G3, ER+, PR-, HER2-) - Unsigned Stage prefix: Initial diagnosis Method of lymph node assessment: Clinical Histologic grading system: 3 grade system - Pathologic: Stage IIA (pT2, pN0, cM0, G3, ER+, PR-, HER2-) - Signed by Gardenia Phlegm, NP on 11/27/2021 Histologic grading system: 3 grade system   SUMMARY OF ONCOLOGIC HISTORY: Oncology History  Malignant neoplasm of upper-outer quadrant of right breast in female, estrogen receptor positive ([place])  08/08/2021 Imaging   August 08, 2021, patient had bilateral screening mammogram which showed 2 possible masses in the right breast which warrant further evaluation.  No concerning findings in the left breast. She had diagnostic mammogram on 09/13/2021 which noted possible mass in the upper central breast at middle depth which persists on additional views is 1.9 cm oval mass with indistinct margins.  Second previously noted possible mass in the upper outer breast at anterior to middle depth dissipates on additional views consistent with overlapping fibroglandular tissue. Ultrasound showed suspicious 1.9 cm mass at the right breast 12 o'clock position for which ultrasound-guided biopsy was recommended no right axillary lymphadenopathy.   09/25/2021 Initial Diagnosis   Malignant neoplasm of upper-outer quadrant of right breast in female, estrogen receptor positive ([place])    Genetic Testing   [facility] CancerNext-Expanded Panel was Negative. 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 Report date is 10/20/2021.  The CancerNext-Expanded gene panel offered by [facility] and includes  sequencing, rearrangement, and RNA analysis for the following 77 genes: AIP, ALK, APC, ATM, AXIN2, BAP1, BARD1, BLM, BMPR1A, BRCA1, BRCA2, BRIP1, CDC73, CDH1, CDK4, CDKN1B, CDKN2A, CHEK2, CTNNA1, DICER1, FANCC, FH, FLCN, GALNT12, KIF1B, LZTR1, MAX, MEN1, MET, MLH1, MSH2, MSH3, MSH6, MUTYH, NBN, NF1, NF2, NTHL1, PALB2, PHOX2B, PMS2, POT1, PRKAR1A, PTCH1, PTEN, RAD51C, RAD51D, RB1, RECQL, RET, SDHA, SDHAF2, SDHB, SDHC, SDHD, SMAD4, SMARCA4, SMARCB1, SMARCE1, STK11, SUFU, TMEM127, TP53, TSC1, TSC2, VHL and XRCC2 (sequencing and deletion/duplication); EGFR, EGLN1, HOXB13, KIT, MITF, PDGFRA, POLD1, and POLE (sequencing only); EPCAM and GREM1 (deletion/duplication only).    10/06/2021 Surgery   A. BREAST, RIGHT, LUMPECTOMY:  -  Invasive carcinoma of no special type (ductal), grade3 (total  Nottingham score 9), 25 mm in greatest dimension.  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  Prognostics from initial biopsy showed ER +30%, weak staining, PR 0%, negative, Ki-67 of 90%.  HER2 negative by IHC.     11/06/2021 -  Chemotherapy   Patient is on Treatment Plan : BREAST TC q21d     11/27/2021 Cancer Staging   Staging form: Breast, AJCC 8th Edition - Pathologic: Stage IIA (pT2, pN0, cM0, G3, ER+, PR-, HER2-) - Signed by Gardenia Phlegm, NP on 11/27/2021 Histologic grading system: 3 grade system     CURRENT THERAPY: Taxotere/Cytoxan  INTERVAL HISTORY:  Susan Benjamin 66 y.o. female returns for evaluation after completing 4 planned cycles of TC She completed adjuvant chemotherapy on 01/09/2022 She is currently undergoing adjuvant radiation, has 1 more week left.  She is tolerating radiation very well.  She overall has recovered well from a chemotherapy, still feels a bit fatigued and also has noticed this low  back pain which radiates down the left leg, this has not improved since she finished chemotherapy.  She had a slight pain in this area before chemotherapy  but this has gotten markedly worse during chemo.  She has been taking Tylenol's to mitigate this pain.  She was hoping we can do some further investigation or treatment for the management of this pain.  No change in breathing, bowel habits or urinary habits. Rest of the pertinent 10 point ROS reviewed and negative  Review of Systems  Constitutional:  Negative for chills and fever.  HENT:  Negative for hearing loss.   Respiratory:  Negative for cough and shortness of breath.   Cardiovascular:  Negative for chest pain and palpitations.  Gastrointestinal:  Negative for abdominal pain, constipation, diarrhea, nausea and vomiting.  Skin:  Negative for itching and rash.  Neurological:  Negative for dizziness and headaches.  Endo/Heme/Allergies:  Does not bruise/bleed easily.  Psychiatric/Behavioral:  Negative for depression. The patient is not nervous/anxious.       Patient Active Problem List   Diagnosis Date Noted   Muscle cramps 01/09/2022   Normocytic normochromic anemia 01/09/2022   Dysgeusia 12/19/2021   Port-A-Cath in place 12/18/2021   Genetic testing 10/09/2021   Malignant neoplasm of upper-outer quadrant of right breast in female, estrogen receptor positive ([place]) 09/25/2021   Benign colon polyp 10/10/2012    has No Known Allergies.  MEDICAL HISTORY: Past Medical History:  Diagnosis Date   Anxiety    Cancer ([place])    CIN I (cervical intraepithelial neoplasia I)    Ectopic pregnancy    X 2   Endometrial polyp    PONV (postoperative nausea and vomiting)    Seasonal allergies     SURGICAL HISTORY: Past Surgical History:  Procedure Laterality Date   BREAST LUMPECTOMY WITH RADIOACTIVE SEED AND SENTINEL LYMPH NODE BIOPSY Right 10/06/2021   Procedure: RIGHT BREAST LUMPECTOMY WITH RADIOACTIVE SEED AND SENTINEL LYMPH NODE BIOPSY;  Surgeon: Donnie Mesa, MD;  Location: [facility];  Service: General;  Laterality: Right;   CARPAL TUNNEL RELEASE     CERVICAL  BIOPSY  W/ LOOP ELECTRODE EXCISION  2003   DILATION AND CURETTAGE OF UTERUS  2011   ECTOPIC PREGNANCY SURGERY     X 2-Right and Left salpingectomy   FOOT SURGERY     HYSTEROSCOPY  2011   endometrial polyp   PORTACATH PLACEMENT Right 10/06/2021   Procedure: INSERTION PORT-A-CATH;  Surgeon: Donnie Mesa, MD;  Location: [facility];  Service: General;  Laterality: Right;   TUBAL LIGATION     tubal lig and tubal reversal   VAGINAL HYSTERECTOMY  2012    SOCIAL HISTORY: Social History   Socioeconomic History   Marital status: Married    Spouse name: Not on file   Number of children: Not on file   Years of education: Not on file   Highest education level: Not on file  Occupational History   Not on file  Tobacco Use   Smoking status: Never   Smokeless tobacco: Never  Vaping Use   Vaping Use: Never used  Substance and Sexual Activity   Alcohol use: Not Currently    Comment: rarely   Drug use: No   Sexual activity: Yes    Birth control/protection: Surgical    Comment: INTERCOURSE AGE 15, SEXUAL PARTNERS LESS THAN 5  Other Topics Concern   Not on file  Social History Narrative   Not on file   Social Determinants  of Health   Financial Resource Strain: Not on file  Food Insecurity: Not on file  Transportation Needs: Not on file  Physical Activity: Not on file  Stress: Not on file  Social Connections: Not on file  Intimate Partner Violence: Not on file    FAMILY HISTORY: Family History  Problem Relation Age of Onset   Diabetes Mother    Hypertension Mother    Stroke Mother    Heart disease Mother    Hypertension Father    Melanoma Father 3       metastasized   Breast cancer Cousin        dx. 40s     PHYSICAL EXAMINATION  ECOG PERFORMANCE STATUS: 1 - Symptomatic but completely ambulatory  Vitals:   03/07/22 0936  BP: 137/90  Pulse: 85  Resp: 18  Temp: 98.1 F (36.7 C)  SpO2: 97%    Physical Exam Constitutional:      General: She is  not in acute distress.    Appearance: Normal appearance. She is not toxic-appearing.  HENT:     Head: Normocephalic and atraumatic.  Eyes:     General: No scleral icterus. Cardiovascular:     Rate and Rhythm: Normal rate and regular rhythm.     Pulses: Normal pulses.     Heart sounds: Normal heart sounds.  Pulmonary:     Effort: Pulmonary effort is normal.     Breath sounds: Normal breath sounds.  Chest:     Comments: Right breast undergoing radiation, as expected postradiation changes Abdominal:     General: Abdomen is flat. Bowel sounds are normal. There is no distension.     Palpations: Abdomen is soft.     Tenderness: There is no abdominal tenderness.  Musculoskeletal:        General: No swelling.     Cervical back: Neck supple.  Lymphadenopathy:     Cervical: No cervical adenopathy.  Skin:    General: Skin is warm and dry.     Findings: No rash.  Neurological:     General: No focal deficit present.     Mental Status: She is alert.  Psychiatric:        Mood and Affect: Mood normal.        Behavior: Behavior normal.     LABORATORY DATA:  CBC    Component Value Date/Time   WBC 8.5 01/09/2022 0855   WBC 11.7 (H) 11/06/2021 1103   RBC 3.44 (L) 01/09/2022 0855   HGB 10.7 (L) 01/09/2022 0855   HCT 31.5 (L) 01/09/2022 0855   PLT 382 01/09/2022 0855   MCV 91.6 01/09/2022 0855   MCH 31.1 01/09/2022 0855   MCHC 34.0 01/09/2022 0855   RDW 15.4 01/09/2022 0855   LYMPHSABS 1.1 01/09/2022 0855   MONOABS 0.6 01/09/2022 0855   EOSABS 0.0 01/09/2022 0855   BASOSABS 0.0 01/09/2022 0855    CMP     Component Value Date/Time   NA 136 01/09/2022 0855   K 3.8 01/09/2022 0855   CL 104 01/09/2022 0855   CO2 21 (L) 01/09/2022 0855   GLUCOSE 165 (H) 01/09/2022 0855   BUN 17 01/09/2022 0855   CREATININE 0.80 01/09/2022 0855   CREATININE 0.80 02/06/2021 0930   CALCIUM 9.2 01/09/2022 0855   PROT 6.4 (L) 01/09/2022 0855   ALBUMIN 3.9 01/09/2022 0855   AST 19 01/09/2022  0855   ALT 19 01/09/2022 0855   ALKPHOS 58 01/09/2022 0855   BILITOT 0.6 01/09/2022 0855  GFRNONAA >60 01/09/2022 0855    ASSESSMENT and THERAPY PLAN:   Malignant neoplasm of upper-outer quadrant of right breast in female, estrogen receptor positive ([place]) This is a very pleasant 66 year old postmenopausal female patient with no significant past medical history referred to medical oncology for new diagnosis of right breast invasive ductal carcinoma. We have discussed about consideration for adjuvant chemotherapy given her tumor with high grade, high proliferation index of 90% on the original specimen, The tumor has many unfavorable features, including grade 3, ER weakly positive, PR negative, Ki 90%. We discussed about oncotype testing however given several unfavorable features, she is now receiving adjuvant docetaxel and cyclophosphamide.  She completed adjuvant chemotherapy on 01/09/2022.  She has recovered well from chemotherapy, continues to however have fatigue and low back pain radiating down the left leg.  No physical examination findings.  She will be due for mammogram January 2024, this has been ordered.  Postradiation, she will start antiestrogen therapy given her weak ER positive tumor.  We have today once again discussed about role of tamoxifen versus aromatase inhibitors, mechanism of action for each class, adverse effects from each class.  We have decided to start her on anastrozole.  Baseline bone density scan has been ordered.  I have recommended her to complete radiation, wait for couple weeks and start antiestrogen therapy.  She will follow-up with our survivorship care clinic in 3 months which will be around November 2023.  For the back pain radiating down the left leg, we have discussed that this is likely unrelated to the cancer or chemotherapy.  This very much sounds like a sciatic nerve pain.  I have ordered an x-ray of her lumbar spine.  She was instructed to walk-in today to  get this done.  If x-rays are unremarkable, she may want to follow-up about this with her PCP and she expressed understanding.   All questions were answered. The patient knows to call the clinic with any problems, questions or concerns. We can certainly see the patient much sooner if nec  Total encounter time:30 minutes*in face-to-face visit time, chart review, lab review, care coordination, order entry, and documentation of the encounter time.  Benay Pike MD   *Total Encounter Time as defined by the Centers for Medicare and Medicaid Services includes, in addition to the face-to-face time of a patient visit (documented in the note above) non-face-to-face time: obtaining and reviewing outside history, ordering and reviewing medications, tests or procedures, care coordination (communications with other health care professionals or caregivers) and documentation in the medical record.

## 2022-03-08 ENCOUNTER — Telehealth: Payer: Self-pay | Admitting: *Deleted

## 2022-03-08 ENCOUNTER — Other Ambulatory Visit: Payer: Self-pay

## 2022-03-08 ENCOUNTER — Ambulatory Visit
Admission: RE | Admit: 2022-03-08 | Discharge: 2022-03-08 | Disposition: A | Discharge status: Home / Self Care | Payer: BC Managed Care – PPO | Source: Ambulatory Visit | Attending: Radiation Oncology | Admitting: Radiation Oncology

## 2022-03-08 DIAGNOSIS — C50411 Malignant neoplasm of upper-outer quadrant of right female breast: Secondary | ICD-10-CM | POA: Diagnosis not present

## 2022-03-08 LAB — RAD ONC ARIA SESSION SUMMARY
Course Elapsed Days: 23
Plan Fractions Treated to Date: 1
Plan Prescribed Dose Per Fraction: 2 Gy
Plan Total Fractions Prescribed: 4
Plan Total Prescribed Dose: 8 Gy
Reference Point Dosage Given to Date: 44.56 Gy
Reference Point Session Dosage Given: 2 Gy
Session Number: 17

## 2022-03-08 NOTE — Telephone Encounter (Signed)
This RN spoke with pt per results of lumbar spine films- reviewed noted areas of degeneration -and possible benefits from exercises-stretches- use of ice and when to use tylenol and ibuprofen.  Pt verbalized understanding- as well as best to follow up with primary MD for further evaluation and recommendations.

## 2022-03-08 NOTE — Telephone Encounter (Signed)
-----   Message from Benay Pike, MD sent at 03/08/2022  1:41 PM EDT ----- Can you let her know the X ray results please  Thanks,

## 2022-03-09 ENCOUNTER — Ambulatory Visit
Admission: RE | Admit: 2022-03-09 | Discharge: 2022-03-09 | Disposition: A | Discharge status: Home / Self Care | Payer: BC Managed Care – PPO | Source: Ambulatory Visit | Attending: Radiation Oncology | Admitting: Radiation Oncology

## 2022-03-09 ENCOUNTER — Ambulatory Visit: Payer: BC Managed Care – PPO

## 2022-03-09 ENCOUNTER — Other Ambulatory Visit: Payer: Self-pay

## 2022-03-09 DIAGNOSIS — C50411 Malignant neoplasm of upper-outer quadrant of right female breast: Secondary | ICD-10-CM | POA: Diagnosis not present

## 2022-03-09 LAB — RAD ONC ARIA SESSION SUMMARY
Course Elapsed Days: 24
Plan Fractions Treated to Date: 2
Plan Prescribed Dose Per Fraction: 2 Gy
Plan Total Fractions Prescribed: 4
Plan Total Prescribed Dose: 8 Gy
Reference Point Dosage Given to Date: 46.56 Gy
Reference Point Session Dosage Given: 2 Gy
Session Number: 18

## 2022-03-12 ENCOUNTER — Encounter: Payer: Self-pay | Admitting: *Deleted

## 2022-03-12 ENCOUNTER — Ambulatory Visit
Admission: RE | Admit: 2022-03-12 | Discharge: 2022-03-12 | Disposition: A | Discharge status: Home / Self Care | Payer: BC Managed Care – PPO | Source: Ambulatory Visit | Attending: Radiation Oncology | Admitting: Radiation Oncology

## 2022-03-12 ENCOUNTER — Other Ambulatory Visit: Payer: Self-pay

## 2022-03-12 DIAGNOSIS — C50411 Malignant neoplasm of upper-outer quadrant of right female breast: Secondary | ICD-10-CM | POA: Diagnosis not present

## 2022-03-12 DIAGNOSIS — Z170 Estrogen receptor positive status [ER+]: Secondary | ICD-10-CM

## 2022-03-12 LAB — RAD ONC ARIA SESSION SUMMARY
Course Elapsed Days: 27
Plan Fractions Treated to Date: 3
Plan Prescribed Dose Per Fraction: 2 Gy
Plan Total Fractions Prescribed: 4
Plan Total Prescribed Dose: 8 Gy
Reference Point Dosage Given to Date: 48.56 Gy
Reference Point Session Dosage Given: 2 Gy
Session Number: 19

## 2022-03-12 IMAGING — MG MM BREAST LOCALIZATION CLIP
4 series · 4 of 12 positions shown · non-contrast
Comparison: Previous exam(s).

CLINICAL DATA: Evaluate radioactive seed placement

EXAM:
3D DIAGNOSTIC RIGHT MAMMOGRAM POST ULTRASOUND GUIDED RADIOACTIVE
SEED PLACEMENT

[R ML synth-2D]
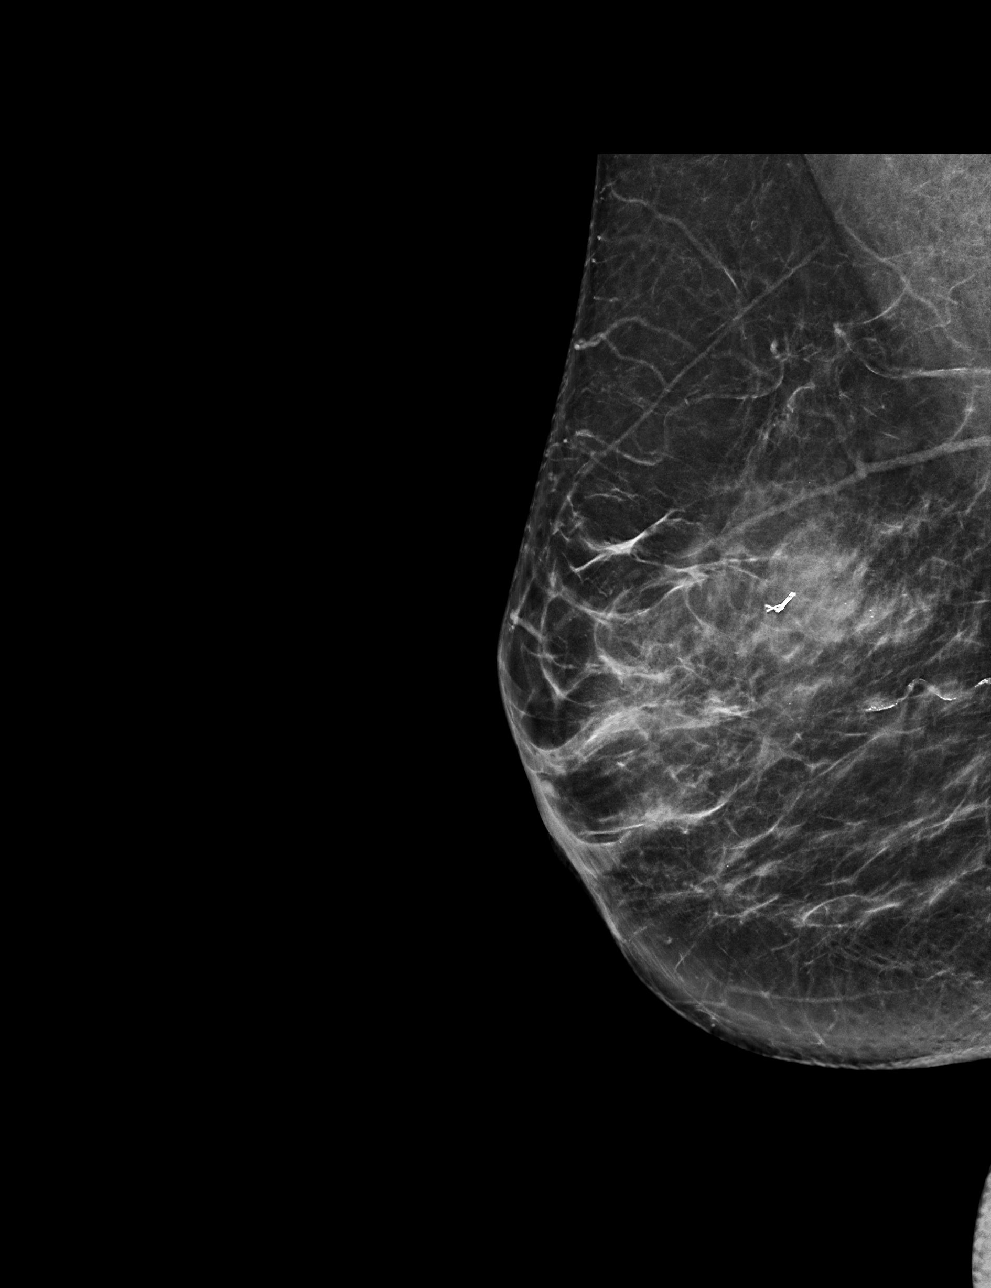

[R CC synth-2D]
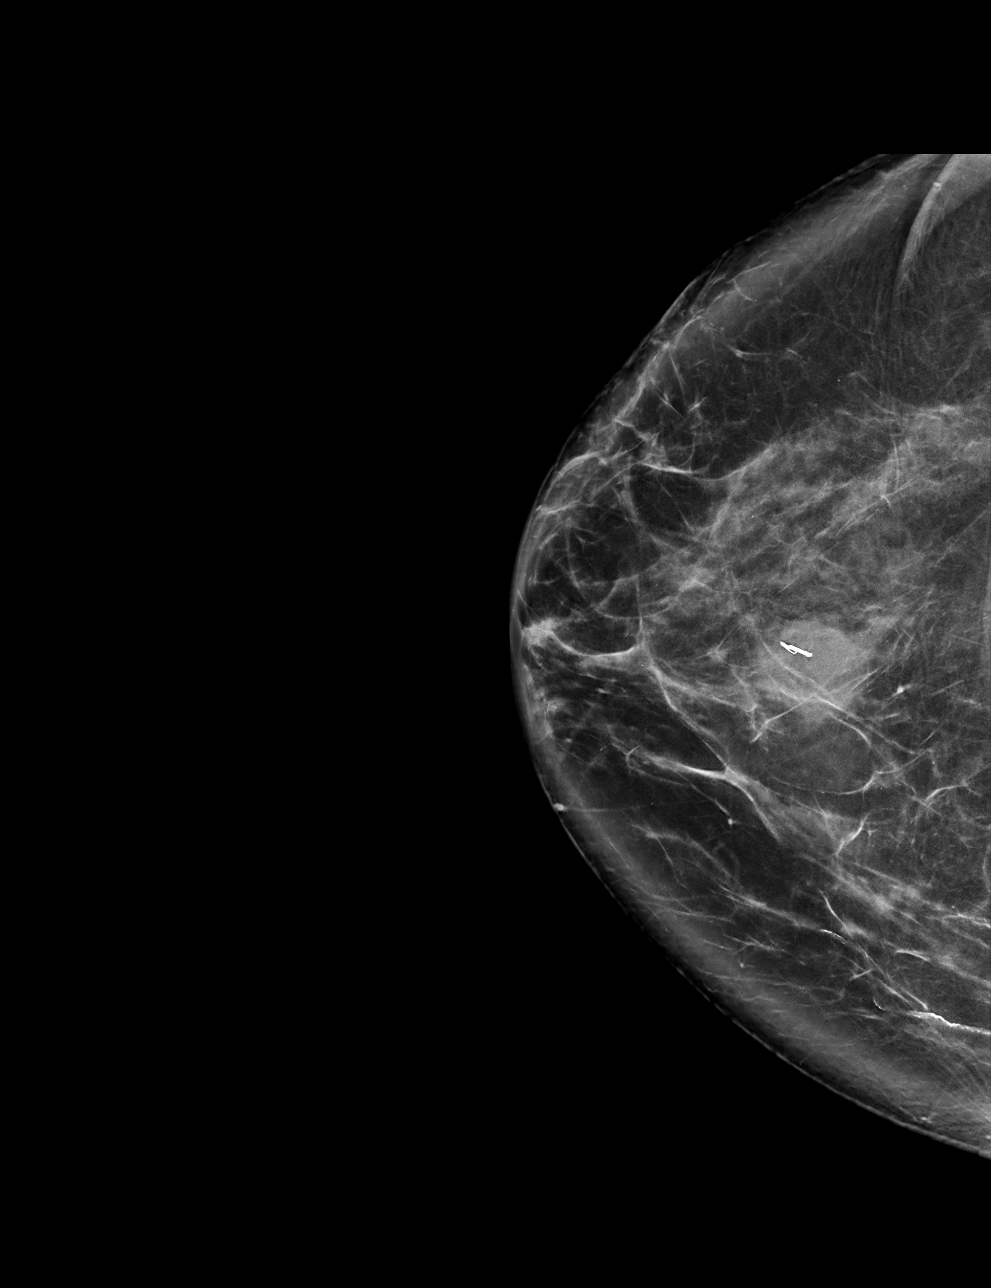

[R CC tomo · tomo slice 41/82.0]
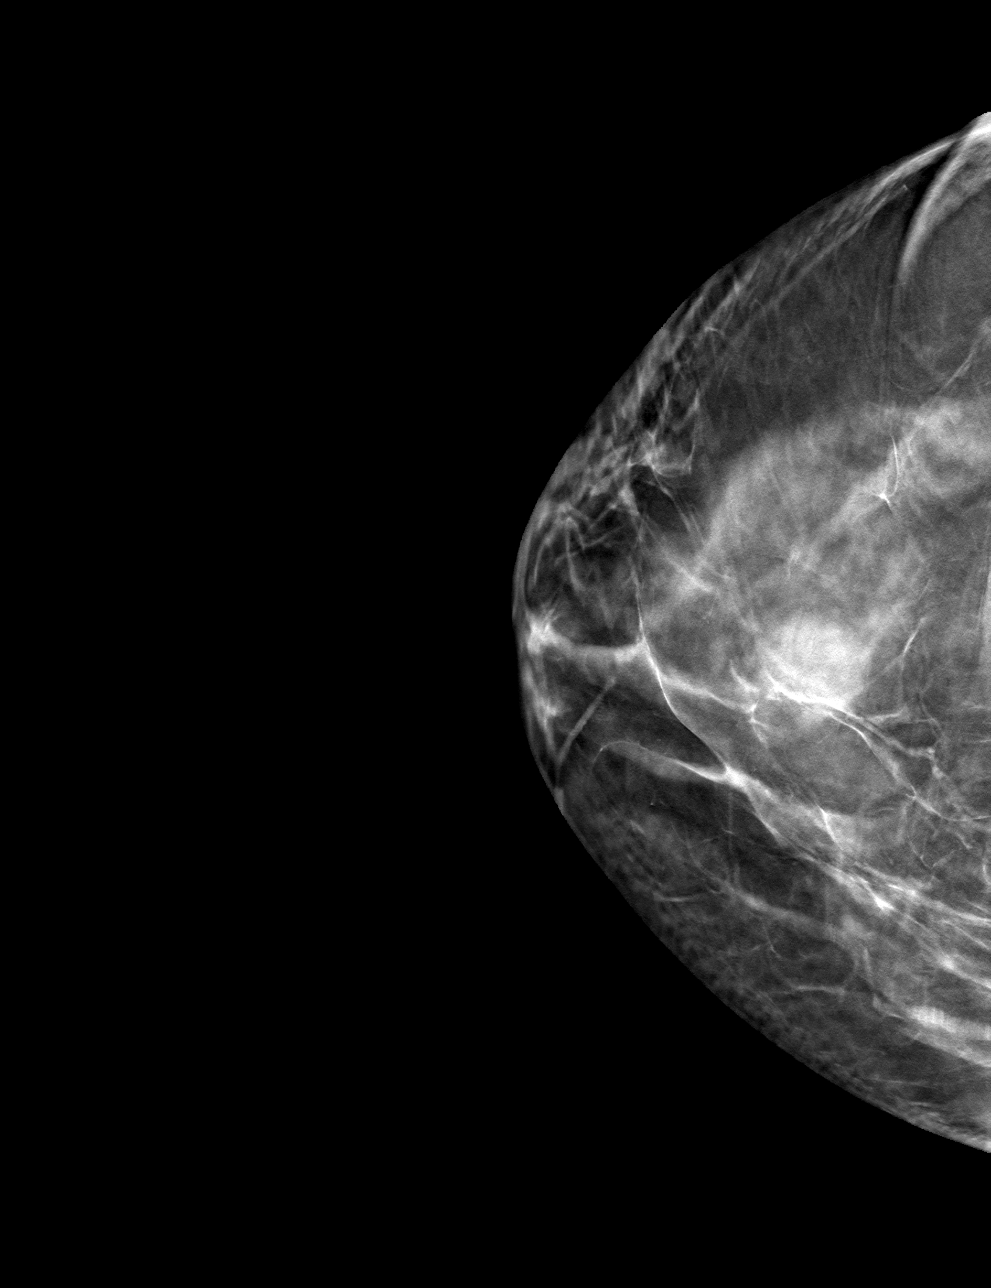

[R ML tomo · tomo slice 35/69.0]
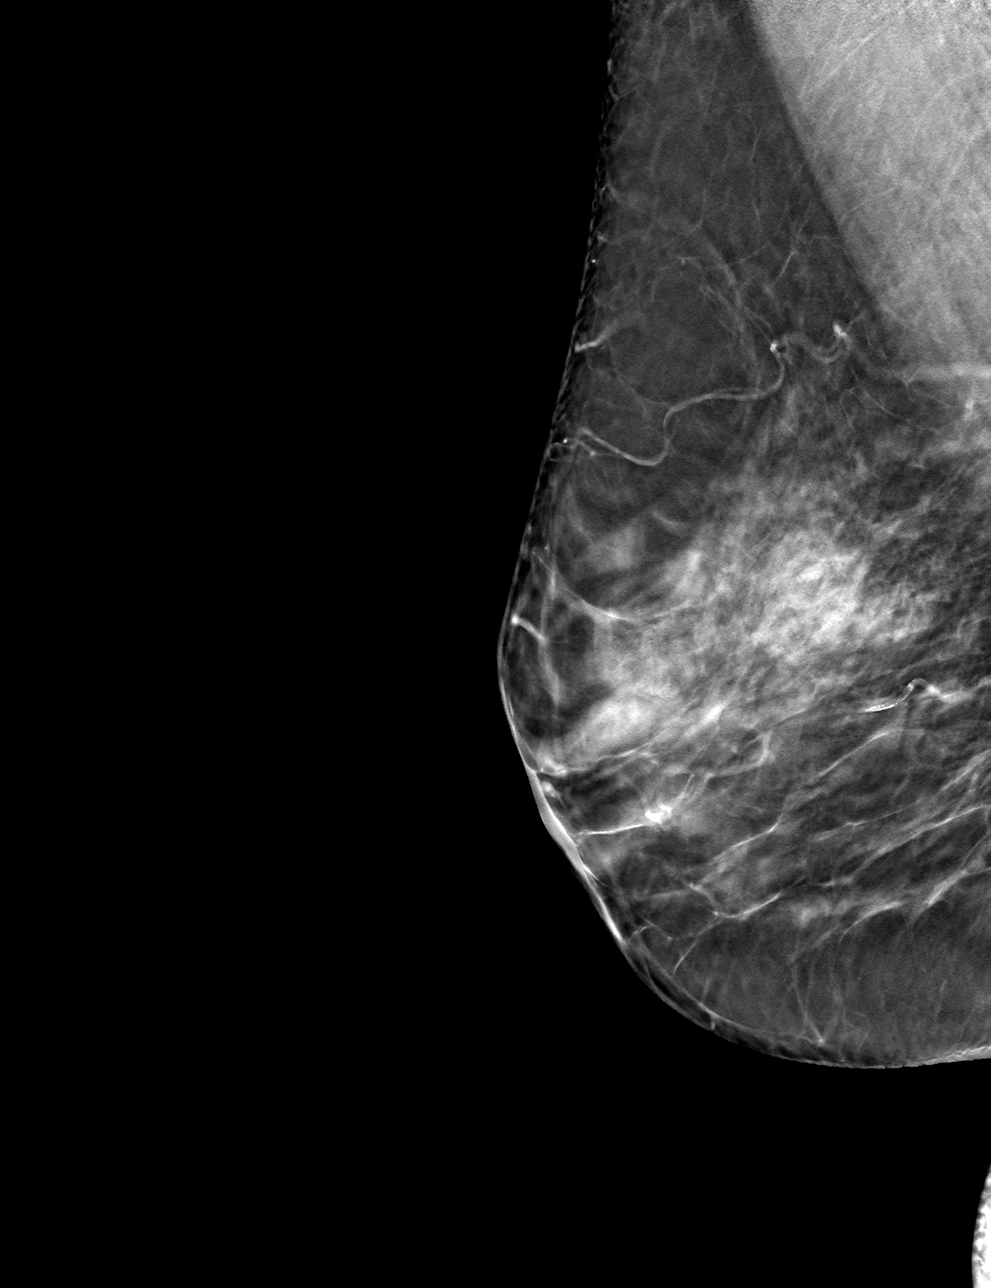

[4 of 12 positions shown; findings below may reference images not displayed]

FINDINGS: 3D Mammographic images were obtained following ultrasound guided
radioactive seed placement. The radioactive seed is within the known
malignancy, immediately adjacent to the biopsy clip.
IMPRESSION: The radioactive seed is within the known malignancy, immediately
adjacent to the biopsy clip.

Final Assessment: Post Procedure Mammograms for Marker Placement

## 2022-03-12 IMAGING — US US NEEDLE LOCALIZATION*R*
1 series · 2 of 2 positions shown · non-contrast
Comparison: Previous exam(s).

CLINICAL DATA: Localization prior to surgery

EXAM:
ULTRASOUND GUIDED RADIOACTIVE SEED LOCALIZATION OF THE RIGHT BREAST

[Series 1: us needle localization*right* · 0.07mm/px · 2 of 2 slices shown]
[im 1/2]
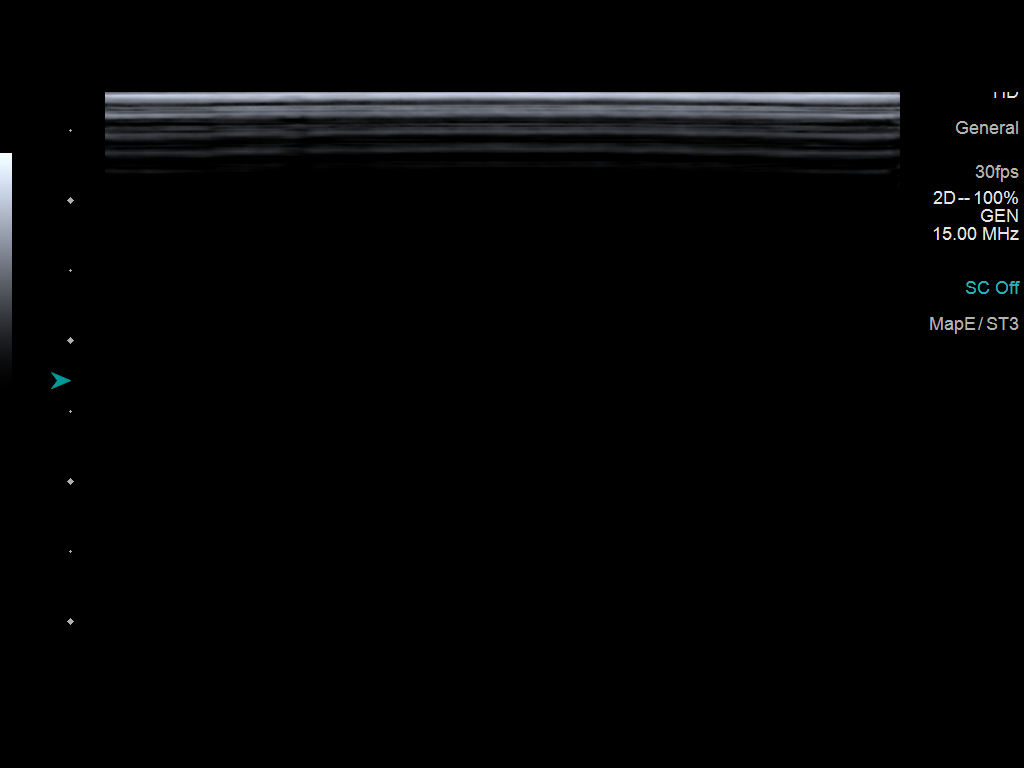
[im 2/2]
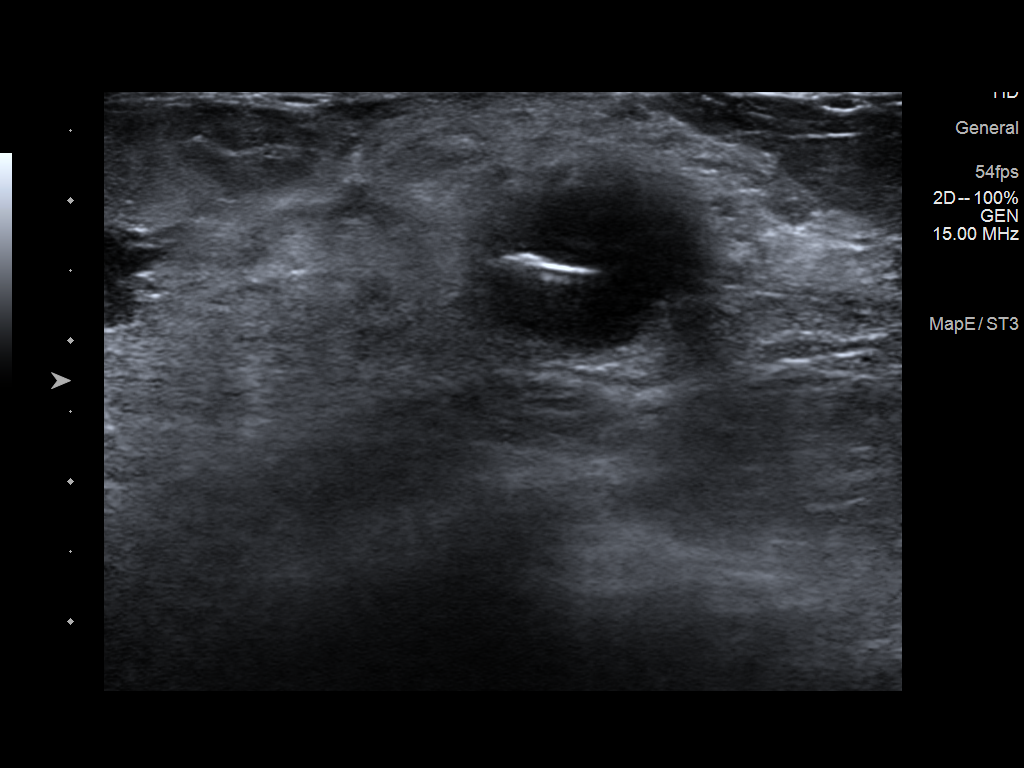

[2 of 2 positions shown; findings below may reference images not displayed]



The usual time-out protocol was performed immediately prior to the
procedure.

Using ultrasound guidance, sterile technique, 1% lidocaine and an
L-QFK radioactive seed, the known malignancy at 12 o'clock in the
right breast was localized using a lateral approach. The follow-up
mammogram images confirm the seed in the expected location and were
marked for the surgeon.

Follow-up survey of the patient confirms presence of the radioactive
seed.

Order number of L-QFK seed:  [id number].

Total activity:  0.251 millicuries reference Date: October 02, 2021

The patient tolerated the procedure well and was released from the
[REDACTED]. She was given instructions regarding seed removal.
IMPRESSION: Radioactive seed localization right breast. No apparent
complications.

## 2022-03-13 ENCOUNTER — Encounter: Payer: Self-pay | Admitting: Radiation Oncology

## 2022-03-13 ENCOUNTER — Ambulatory Visit
Admission: RE | Admit: 2022-03-13 | Discharge: 2022-03-13 | Disposition: A | Discharge status: Home / Self Care | Payer: BC Managed Care – PPO | Source: Ambulatory Visit | Attending: Radiation Oncology | Admitting: Radiation Oncology

## 2022-03-13 ENCOUNTER — Other Ambulatory Visit: Payer: Self-pay

## 2022-03-13 DIAGNOSIS — C50411 Malignant neoplasm of upper-outer quadrant of right female breast: Secondary | ICD-10-CM | POA: Diagnosis not present

## 2022-03-13 LAB — RAD ONC ARIA SESSION SUMMARY
Course Elapsed Days: 28
Plan Fractions Treated to Date: 4
Plan Prescribed Dose Per Fraction: 2 Gy
Plan Total Fractions Prescribed: 4
Plan Total Prescribed Dose: 8 Gy
Reference Point Dosage Given to Date: 50.56 Gy
Reference Point Session Dosage Given: 2 Gy
Session Number: 20

## 2022-03-13 IMAGING — DX MM BREAST SURGICAL SPECIMEN
1 series · 2 of 2 positions shown · non-contrast
Comparison: Previous exam(s).

CLINICAL DATA: Evaluate surgical specimen following lumpectomy for
RIGHT breast cancer.

EXAM:
SPECIMEN RADIOGRAPH OF THE RIGHT BREAST

[Series 1: specimen digital x-ray · right · 0.07mm/px · 2 of 2 slices shown]
[im 1/2]
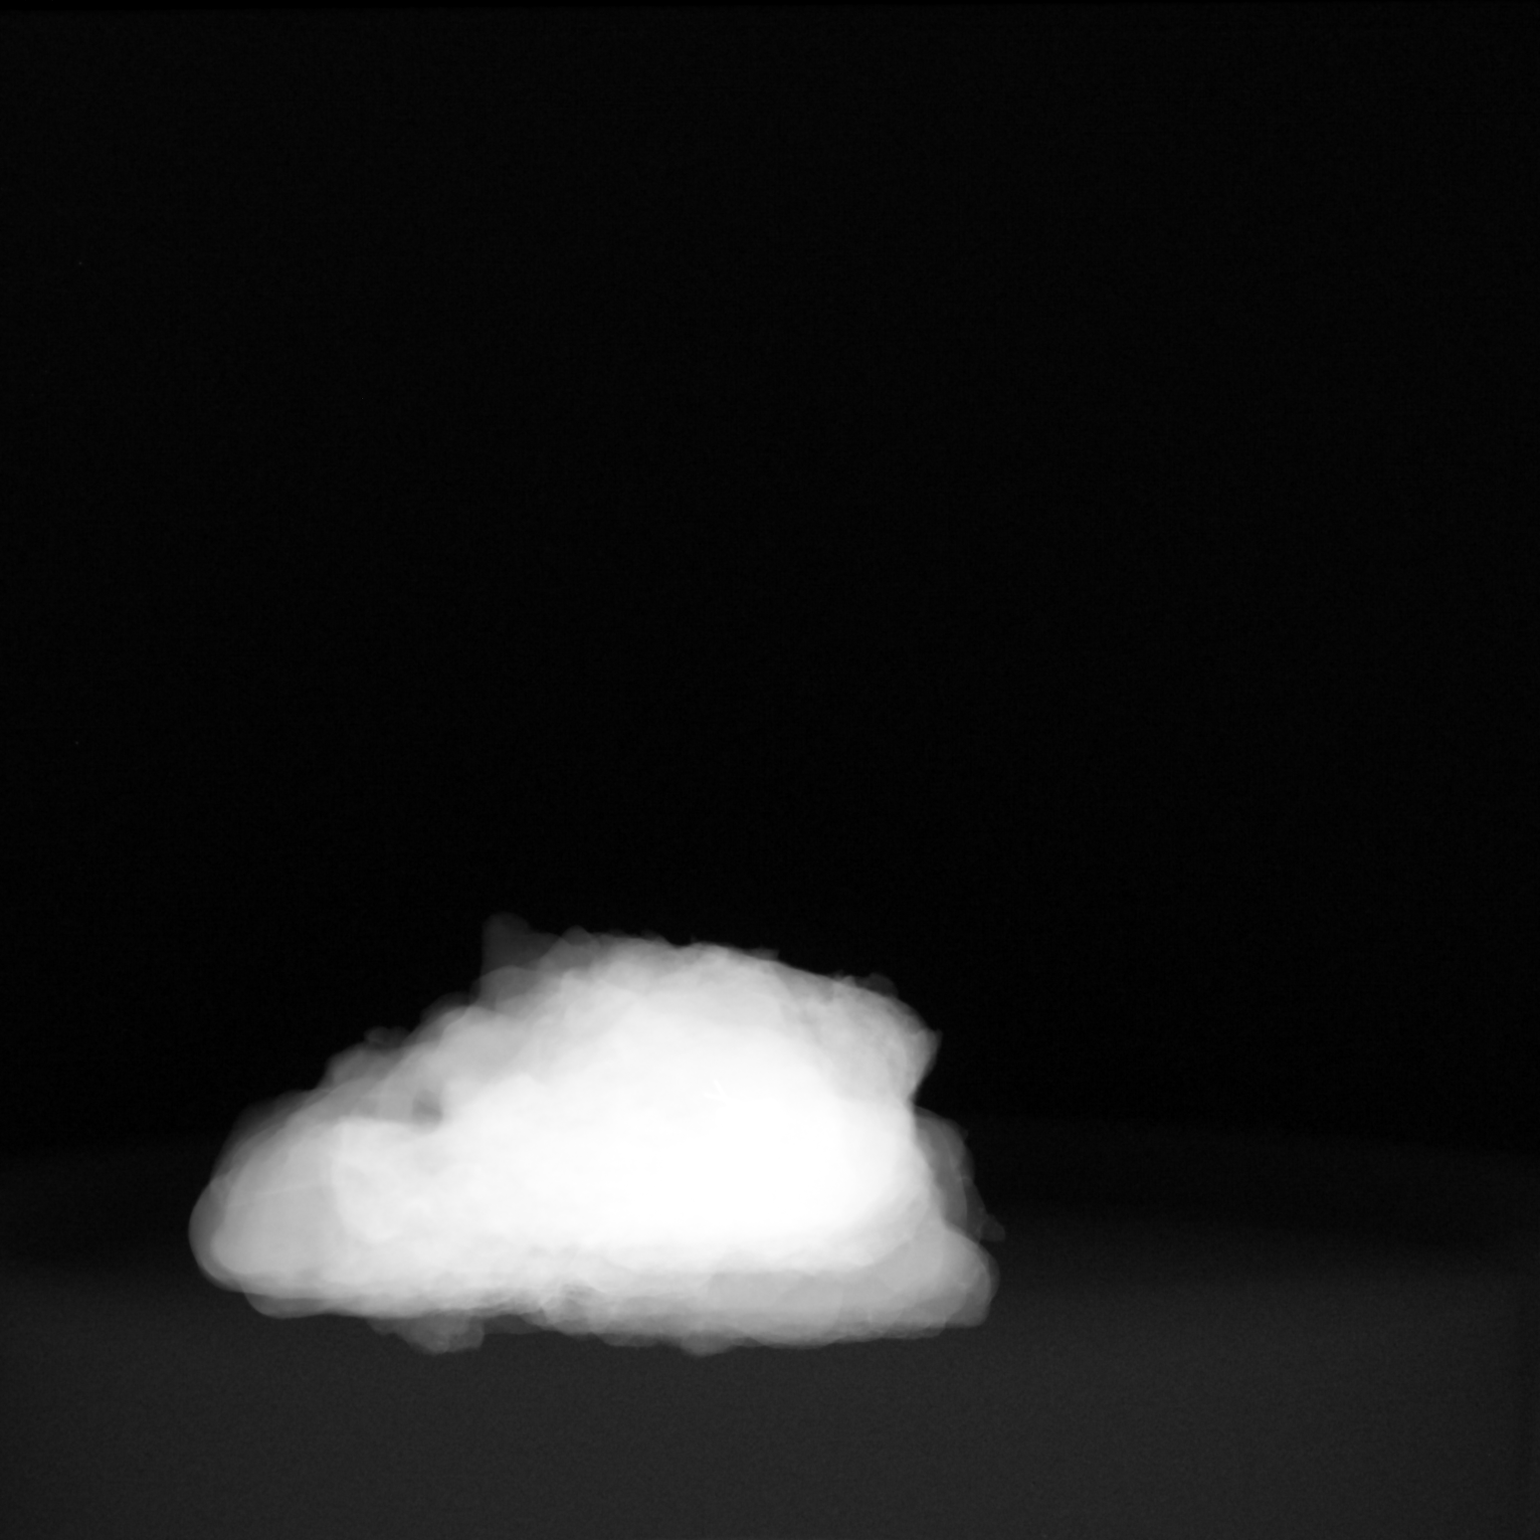
[im 2/2]
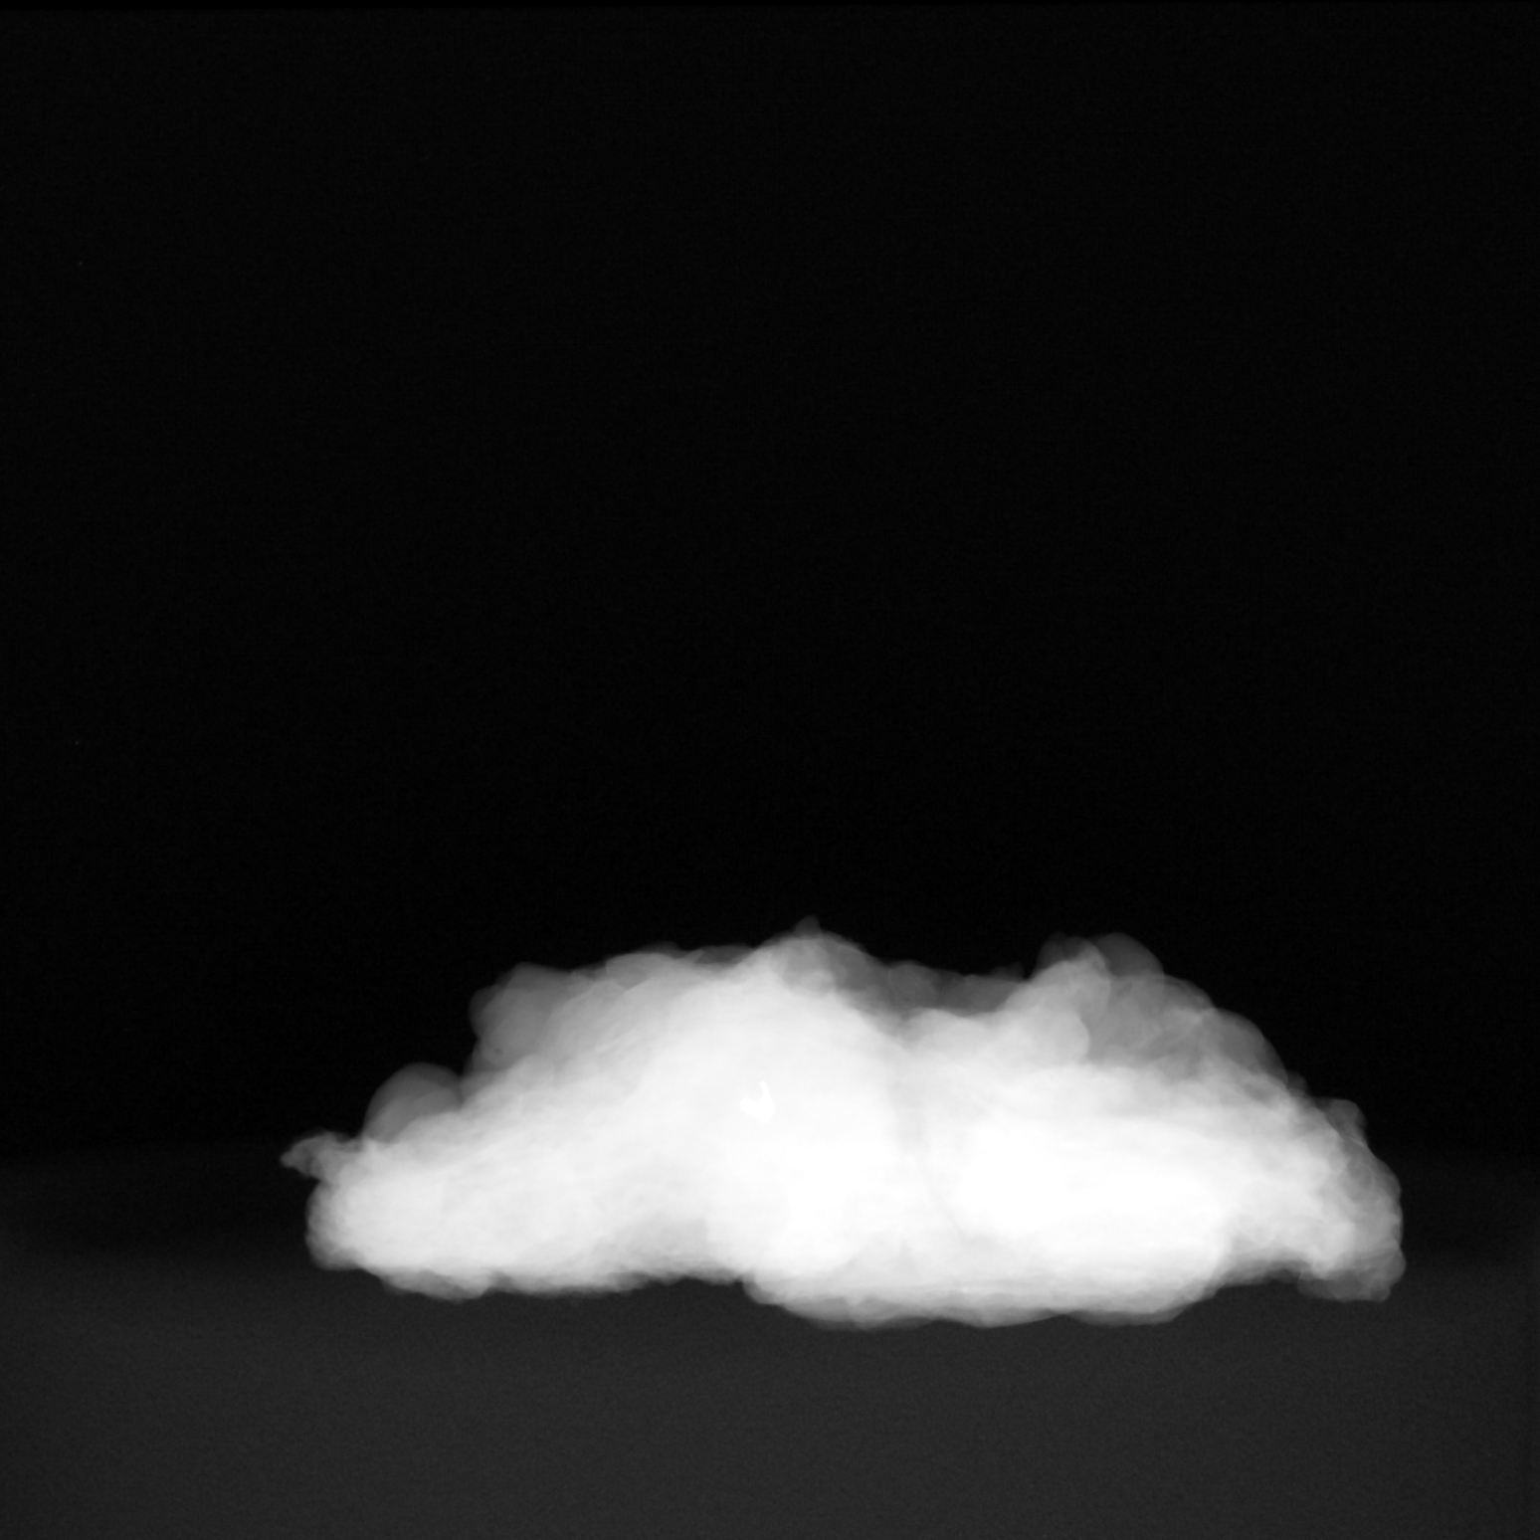

[2 of 2 positions shown; findings below may reference images not displayed]

FINDINGS: Status post excision of the RIGHT breast. The radioactive seed and
RIBBON biopsy marker clip are present and completely intact.
IMPRESSION: Specimen radiograph of the RIGHT breast.

## 2022-03-14 ENCOUNTER — Encounter: Payer: Self-pay | Admitting: Hematology and Oncology

## 2022-03-14 NOTE — Progress Notes (Signed)
                                                                                                                                                             Patient Name: Susan Benjamin MRN: [id number] DOB: 02-04-1956 Referring Physician: Benay Pike Date of Service: 03/13/2022 [facility], [place]                                                        End Of Treatment Note  Diagnoses: C50.411-Malignant neoplasm of upper-outer quadrant of right female breast  Cancer Staging: Stage IIA, pT2N0M0, functionally triple negative, grade 3 invasive ductal carcinoma of the right breast.  Intent: Curative  Radiation Treatment Dates: 02/13/2022 through 03/13/2022 Site Technique Total Dose (Gy) Dose per Fx (Gy) Completed Fx Beam Energies  Breast, Right: Breast_R 3D 42.56/42.56 2.66 16/16 6XFFF  Breast, Right: Breast_R_Bst 3D 8/8 2 4/4 6X, 10X   Narrative: The patient tolerated radiation therapy relatively well. She developed fatigue and anticipated skin changes in the treatment field.   Plan: The patient will receive a call in about one month from the radiation oncology department. She will continue follow up with Dr. Chryl Heck as well.   ________________________________________________    Carola Rhine, [facility]

## 2022-03-26 ENCOUNTER — Other Ambulatory Visit: Payer: Self-pay

## 2022-04-19 NOTE — Progress Notes (Signed)
  Radiation Oncology         [phone] ________________________________  Name: Susan Benjamin MRN: [id number]  Date of Service: 04/23/2022  DOB: [birth year]  Post Treatment Telephone Note  Diagnosis:   Stage IIA, pT2N0M0, functionally triple negative, grade 3 invasive ductal carcinoma of the right breast.  Intent: Curative  Radiation Treatment Dates: 02/13/2022 through 03/13/2022 Site Technique Total Dose (Gy) Dose per Fx (Gy) Completed Fx Beam Energies  Breast, Right: Breast_R 3D 42.56/42.56 2.66 16/16 6XFFF  Breast, Right: Breast_R_Bst 3D 8/8 2 4/4 6X, 10X   Narrative: The patient tolerated radiation therapy relatively well. She developed fatigue and anticipated skin changes in the treatment field.    Impression/Plan: 1. Stage IIA, pT2N0M0, functionally triple negative, grade 3 invasive ductal carcinoma of the right breast.  I was unable to reach the patient but left a voicemail and on the message, I discussed that we would be happy to continue to follow her as needed, but she will also continue to follow up with Dr. Chryl Heck in medical oncology. She was counseled to call if she had questions about skin care or measures to avoid sun exposure to this area.          Carola Rhine, PAC

## 2022-04-23 ENCOUNTER — Ambulatory Visit
Admission: RE | Admit: 2022-04-23 | Discharge: 2022-04-23 | Disposition: A | Discharge status: Home / Self Care | Payer: BC Managed Care – PPO | Source: Ambulatory Visit | Attending: Radiation Oncology | Admitting: Radiation Oncology

## 2022-04-23 DIAGNOSIS — C50411 Malignant neoplasm of upper-outer quadrant of right female breast: Secondary | ICD-10-CM | POA: Insufficient documentation

## 2022-04-23 DIAGNOSIS — Z170 Estrogen receptor positive status [ER+]: Secondary | ICD-10-CM | POA: Insufficient documentation

## 2022-05-14 ENCOUNTER — Ambulatory Visit: Payer: BC Managed Care – PPO

## 2022-05-15 ENCOUNTER — Ambulatory Visit: Payer: BC Managed Care – PPO | Attending: Surgery

## 2022-05-15 VITALS — Wt 164.4 lb

## 2022-05-15 DIAGNOSIS — Z483 Aftercare following surgery for neoplasm: Secondary | ICD-10-CM | POA: Insufficient documentation

## 2022-05-15 NOTE — Therapy (Signed)
OUTPATIENT PHYSICAL THERAPY SOZO SCREENING NOTE   Patient Name: Susan Benjamin MRN: [id number] DOB:February 23, 1956, 66 y.o., female Today's Date: 05/15/2022  PCP: [name], MD REFERRING PROVIDER: Donnie Mesa, MD   PT End of Session - 05/15/22 1149     Visit Number 2   # unchanged due to screen only   PT Start Time 1147    PT Stop Time 1155    PT Time Calculation (min) 8 min    Activity Tolerance Patient tolerated treatment well    Behavior During Therapy WFL for tasks assessed/performed             Past Medical History:  Diagnosis Date   Anxiety    Cancer ([place])    CIN I (cervical intraepithelial neoplasia I)    Ectopic pregnancy    X 2   Endometrial polyp    PONV (postoperative nausea and vomiting)    Seasonal allergies    Past Surgical History:  Procedure Laterality Date   BREAST LUMPECTOMY WITH RADIOACTIVE SEED AND SENTINEL LYMPH NODE BIOPSY Right 10/06/2021   Procedure: RIGHT BREAST LUMPECTOMY WITH RADIOACTIVE SEED AND SENTINEL LYMPH NODE BIOPSY;  Surgeon: Donnie Mesa, MD;  Location: [facility];  Service: General;  Laterality: Right;   CARPAL TUNNEL RELEASE     CERVICAL BIOPSY  W/ LOOP ELECTRODE EXCISION  2003   DILATION AND CURETTAGE OF UTERUS  2011   ECTOPIC PREGNANCY SURGERY     X 2-Right and Left salpingectomy   FOOT SURGERY     HYSTEROSCOPY  2011   endometrial polyp   PORTACATH PLACEMENT Right 10/06/2021   Procedure: INSERTION PORT-A-CATH;  Surgeon: Donnie Mesa, MD;  Location: [facility];  Service: General;  Laterality: Right;   TUBAL LIGATION     tubal lig and tubal reversal   VAGINAL HYSTERECTOMY  2012   Patient Active Problem List   Diagnosis Date Noted   Muscle cramps 01/09/2022   Normocytic normochromic anemia 01/09/2022   Dysgeusia 12/19/2021   Port-A-Cath in place 12/18/2021   Genetic testing 10/09/2021   Malignant neoplasm of upper-outer quadrant of right breast in female, estrogen receptor positive ([place])  09/25/2021   Benign colon polyp 10/10/2012    REFERRING DIAG: right breast cancer at risk for lymphedema  THERAPY DIAG: Aftercare following surgery for neoplasm  PERTINENT HISTORY: Patient was diagnosed on 09/19/2021 with right 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 She underwent a right lumpectomy and sentinel node biopsy (3 negative nodes) on 10/06/2021. It is weakly ER positive, PR negative and HER2 negative with a Ki67 of 90%.   PRECAUTIONS: right UE Lymphedema risk, None  SUBJECTIVE: Pt returns for her 3 month L-Dex screen.   PAIN:  Are you having pain? No  SOZO SCREENING: Patient was assessed today using the SOZO machine to determine the lymphedema index score. This was compared to her baseline score. It was determined that she is within the recommended range when compared to her baseline and no further action is needed at this time. She will continue SOZO screenings. These are done every 3 months for 2 years post operatively followed by every 6 months for 2 years, and then annually.   L-DEX FLOWSHEETS - 05/15/22 1100       L-DEX LYMPHEDEMA SCREENING   Measurement Type Unilateral    L-DEX MEASUREMENT EXTREMITY Upper Extremity    POSITION  Standing    DOMINANT SIDE Right    At Risk Side Right    BASELINE SCORE (UNILATERAL) -  4.4    L-DEX SCORE (UNILATERAL) -0.4    VALUE CHANGE (UNILAT) 4              Otelia Limes, PTA 05/15/2022, 11:55 AM

## 2022-05-17 ENCOUNTER — Encounter: Payer: Self-pay | Admitting: Hematology and Oncology

## 2022-05-17 NOTE — Telephone Encounter (Signed)
No entry 

## 2022-05-30 ENCOUNTER — Encounter: Payer: Self-pay | Admitting: Genetic Counselor

## 2022-06-04 NOTE — Progress Notes (Signed)
Referring Physician:  No referring provider defined for this encounter.  Primary Physician:  Susan Dus, Benjamin  History of Present Illness: 06/04/2022 Susan Benjamin is here today with a chief complaint of low back and left leg pain.  She has pain down the back of her leg and the back of her calf to her ankle.  This has been going on intermittently, but consistently over the last 6 months.  She has been undergoing chemotherapy for breast cancer, so had not started treatment of this.  Standing makes her pain better, while sitting or walking significant distances make it worse.  She denies weakness  bowel/Bladder Dysfunction: none  Conservative measures:  Physical therapy:  has not participated  Multimodal medical therapy including regular antiinflammatories:  baclofen, ibuprofen, tylenol, aleve, naproxen Injections:  has not received epidural steroid injections  Past Surgery: denies  Susan Benjamin has no symptoms of cervical myelopathy.  The symptoms are causing a significant impact on the patient's life.   Review of Systems:  A 10 point review of systems is negative, except for the pertinent positives and negatives detailed in the HPI.  Past Medical History: Past Medical History:  Diagnosis Date   Anxiety    Cancer ([place])    CIN I (cervical intraepithelial neoplasia I)    Ectopic pregnancy    X 2   Endometrial polyp    PONV (postoperative nausea and vomiting)    Seasonal allergies     Past Surgical History: Past Surgical History:  Procedure Laterality Date   BREAST LUMPECTOMY WITH RADIOACTIVE SEED AND SENTINEL LYMPH NODE BIOPSY Right 10/06/2021   Procedure: RIGHT BREAST LUMPECTOMY WITH RADIOACTIVE SEED AND SENTINEL LYMPH NODE BIOPSY;  Surgeon: Susan Mesa, Benjamin;  Location: [place];  Service: General;  Laterality: Right;   CARPAL TUNNEL RELEASE     CERVICAL BIOPSY  W/ LOOP ELECTRODE EXCISION  2003   DILATION AND CURETTAGE OF UTERUS  2011    ECTOPIC PREGNANCY SURGERY     X 2-Right and Left salpingectomy   FOOT SURGERY     HYSTEROSCOPY  2011   endometrial polyp   PORTACATH PLACEMENT Right 10/06/2021   Procedure: INSERTION PORT-A-CATH;  Surgeon: Susan Mesa, Benjamin;  Location: [place];  Service: General;  Laterality: Right;   TUBAL LIGATION     tubal lig and tubal reversal   VAGINAL HYSTERECTOMY  2012    Allergies: Allergies as of 06/05/2022   (No Known Allergies)    Medications: No outpatient medications have been marked as taking for the 06/05/22 encounter (Appointment) with Susan Benjamin.    Social History: Social History   Tobacco Use   Smoking status: Never   Smokeless tobacco: Never  Vaping Use   Vaping Use: Never used  Substance Use Topics   Alcohol use: Not Currently    Comment: rarely   Drug use: No    Family Medical History: Family History  Problem Relation Age of Onset   Diabetes Mother    Hypertension Mother    Stroke Mother    Heart disease Mother    Hypertension Father    Melanoma Father 48       metastasized   Breast cancer Cousin        dx. 40s    Physical Examination: There were no vitals filed for this visit.  General: Patient is well developed, well nourished, calm, collected, and in no apparent distress. Attention to examination is appropriate.  Neck:  Supple.  Full range of motion.  Respiratory: Patient is breathing without any difficulty.   NEUROLOGICAL:     Awake, alert, oriented to person, place, and time.  Speech is clear and fluent. Fund of knowledge is appropriate.   Cranial Nerves: Pupils equal round and reactive to light.  Facial tone is symmetric.  Facial sensation is symmetric. Shoulder shrug is symmetric. Tongue protrusion is midline.  There is no pronator drift.  ROM of spine: full.    Strength: Side Biceps Triceps Deltoid Interossei Grip Wrist Ext. Wrist Flex.  R '5 5 5 5 5 5 5  '$ L '5 5 5 5 5 5 5   '$ Side Iliopsoas Quads Hamstring  PF DF EHL  R '5 5 5 5 5 5  '$ L '5 5 5 5 5 5   '$ Reflexes are 2+ and symmetric at the biceps, triceps, brachioradialis, patella and achilles.   Hoffman's is absent.   Bilateral upper and lower extremity sensation is intact to light touch.    No evidence of dysmetria noted.  Gait is antalgic.     Medical Decision Making  Imaging: L spine xrays 03/07/22 FINDINGS: Lumbar vertebral body height are preserved without evidence of fracture. Grade 1 anterolisthesis of L3 on L4 and minimally L4 on L5. No focal bone lesions identified. No spondylolysis visualized. Facet arthropathy most significant from L3-S1. Mild intervertebral disc space narrowing from L3-S1.   IMPRESSION: Degenerative changes as described. No focal bone lesions identified.     Electronically Signed   By: Susan Benjamin M.D.   On: 03/07/2022 13:13  I have personally reviewed the images and agree with the above interpretation.  Assessment and Plan: [name] is a pleasant 66 y.o. female with lumbar radiculopathy most consistent with S1 or perhaps L5 distribution.  I would like to start her on physical therapy.  She has had symptoms for more than 6 months and had x-rays 3 months ago which showed abnormal findings, so I think it is appropriate to pursue lumbar spine MRI at this time.  We will start gabapentin 300 mg at night for 1 week and then increase to 300 mg 3 times daily.  I will also prescribe methocarbamol.  I have encouraged her to take naproxen 2 over-the-counter pills 2-3 times daily.  Based on her MRI results, we may refer her for an injection.  She will go back to her prior physical therapist to evaluate for her back issue.  I will see her back in clinic in approximately 8 weeks.  I spent a total of 30 minutes in face-to-face and non-face-to-face activities related to this patient's care today.  Thank you for involving me in the care of this patient.      Susan Benjamin K. Izora Ribas Benjamin,  [facility]

## 2022-06-05 ENCOUNTER — Encounter: Payer: Self-pay | Admitting: Neurosurgery

## 2022-06-05 ENCOUNTER — Ambulatory Visit (INDEPENDENT_AMBULATORY_CARE_PROVIDER_SITE_OTHER): Payer: Medicare Other | Admitting: Neurosurgery

## 2022-06-05 VITALS — BP 165/100 | HR 79 | Ht 64.0 in | Wt 168.0 lb

## 2022-06-05 DIAGNOSIS — M5416 Radiculopathy, lumbar region: Secondary | ICD-10-CM

## 2022-06-05 MED ORDER — GABAPENTIN 300 MG PO CAPS: 300.0000 mg | ORAL_CAPSULE | Freq: Three times a day (TID) | ORAL | 1 refills | Status: DC

## 2022-06-05 MED ORDER — METHOCARBAMOL 500 MG PO TABS: 500.0000 mg | ORAL_TABLET | Freq: Four times a day (QID) | ORAL | 0 refills | Status: DC | PRN

## 2022-06-06 ENCOUNTER — Encounter: Payer: Self-pay | Admitting: Hematology and Oncology

## 2022-06-13 ENCOUNTER — Encounter: Payer: Self-pay | Admitting: Physical Therapy

## 2022-06-13 ENCOUNTER — Ambulatory Visit
Admission: RE | Admit: 2022-06-13 | Discharge: 2022-06-13 | Disposition: A | Discharge status: Home / Self Care | Payer: Medicare Other | Source: Ambulatory Visit | Attending: Neurosurgery | Admitting: Neurosurgery

## 2022-06-13 ENCOUNTER — Other Ambulatory Visit: Payer: Self-pay

## 2022-06-13 ENCOUNTER — Ambulatory Visit: Payer: Medicare Other | Attending: Neurosurgery | Admitting: Physical Therapy

## 2022-06-13 DIAGNOSIS — M5416 Radiculopathy, lumbar region: Secondary | ICD-10-CM | POA: Insufficient documentation

## 2022-06-13 DIAGNOSIS — M6281 Muscle weakness (generalized): Secondary | ICD-10-CM | POA: Insufficient documentation

## 2022-06-13 NOTE — Therapy (Signed)
OUTPATIENT PHYSICAL THERAPY THORACOLUMBAR EVALUATION   Patient Name: Susan Benjamin MRN: [id number] DOB:25-Feb-1956, 66 y.o., female Today's Date: 06/13/2022   PT End of Session - 06/13/22 0940     Visit Number 1    Date for PT Re-Evaluation 08/08/22    Authorization Type [employer], medicare    PT Start Time [id number]    PT Stop Time 1015    PT Time Calculation (min) 38 min    Activity Tolerance Patient tolerated treatment well             Past Medical History:  Diagnosis Date   Anxiety    Cancer ([place])    CIN I (cervical intraepithelial neoplasia I)    Ectopic pregnancy    X 2   Endometrial polyp    PONV (postoperative nausea and vomiting)    Seasonal allergies    Past Surgical History:  Procedure Laterality Date   BREAST LUMPECTOMY WITH RADIOACTIVE SEED AND SENTINEL LYMPH NODE BIOPSY Right 10/06/2021   Procedure: RIGHT BREAST LUMPECTOMY WITH RADIOACTIVE SEED AND SENTINEL LYMPH NODE BIOPSY;  Surgeon: Donnie Mesa, MD;  Location: [place];  Service: General;  Laterality: Right;   CARPAL TUNNEL RELEASE     CERVICAL BIOPSY  W/ LOOP ELECTRODE EXCISION  2003   DILATION AND CURETTAGE OF UTERUS  2011   ECTOPIC PREGNANCY SURGERY     X 2-Right and Left salpingectomy   FOOT SURGERY     HYSTEROSCOPY  2011   endometrial polyp   PORTACATH PLACEMENT Right 10/06/2021   Procedure: INSERTION PORT-A-CATH;  Surgeon: Donnie Mesa, MD;  Location: [place];  Service: General;  Laterality: Right;   TUBAL LIGATION     tubal lig and tubal reversal   VAGINAL HYSTERECTOMY  2012   Patient Active Problem List   Diagnosis Date Noted   Muscle cramps 01/09/2022   Normocytic normochromic anemia 01/09/2022   Dysgeusia 12/19/2021   Port-A-Cath in place 12/18/2021   Genetic testing 10/09/2021   Malignant neoplasm of upper-outer quadrant of right breast in female, estrogen receptor positive ([place]) 09/25/2021   Benign colon polyp 10/10/2012    PCP: [name]  MD  REFERRING PROVIDER: Meade Maw MD  REFERRING DIAG: M54.16  Lumbar radiculopathy  Rationale for Evaluation and Treatment Rehabilitation  THERAPY DIAG:  Lumbar radiculopathy ONSET DATE: December 27, 2021  SUBJECTIVE:                                                                                                                                                                                           SUBJECTIVE STATEMENT: On 3rd chemo treatment in April had the  onset of left buttock and left LE posterior thigh, calf to ankle pain.  I could hardly get out of the chemo chair.  I was down for days after that.  No numbness/tingling. My daughter says I waddle when I walk.  Can't walk as far. PERTINENT HISTORY:   Patient was diagnosed on 09/19/2021 with right 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 She underwent a right lumpectomy and sentinel node biopsy (3 negative nodes) on 10/06/2021  Are you having pain? Yes NPRS scale: 5/10 Pain location: Left buttock and left LE posterior thigh, calf to ankle pain  Aggravating factors: sitting (better with recliner), can't lie on left side; supine; mornings (feeding the animals) Relieving factors: Advil, gabapentin, naproxen; flex/rotation , prop LE on pillow ; cold pack  PRECAUTIONS: Other: right UE lymphedema risk  WEIGHT BEARING RESTRICTIONS No  FALLS:  Has patient fallen in last 6 months? No  LIVING ENVIRONMENT: Lives with: spouse Lives in: House/apartment Stairs: Yes: External: 3 steps; on right going up OCCUPATION: retired   PLOF: Independent  PATIENT GOALS pain to stop; I love the outdoors, hiking    OBJECTIVE:   DIAGNOSTIC FINDINGS:  X-ray "pinched nerve"  going for MRI today at 12:00  PATIENT SURVEYS:  FOTO 53%  COGNITION:  Overall cognitive status: Within functional limits for tasks assessed     MUSCLE LENGTH: Thomas test: Right 15 deg; Left 0 deg  POSTURE: decreased lumbar lordosis  LUMBAR ROM:   Increased back pain with prone lying;  increased pain with DKTC  Active  A/PROM  eval  Flexion 50 pain  Extension 15  Right lateral flexion 35  Left lateral flexion 30  Right rotation   Left rotation    (Blank rows = not tested)  LOWER EXTREMITY ROM:   decreased left hip internal rotation 0 degrees and painful; decreased right hip external rotation 25 degrees  TRUNK STRENGTH:  Decreased activation of transverse abdominus muscles; abdominals 4-/5; decreased activation of lumbar multifidi; trunk extensors 4-/5 LOWER EXTREMITY MMT:    MMT Right eval Left eval  Hip flexion 5 4  Hip extension 5 4  Hip abduction 4+ 3+  Hip adduction    Hip internal rotation    Hip external rotation 5 3+  Knee flexion    Knee extension 5 4  Ankle dorsiflexion 5 5  Ankle plantarflexion 5 5  Ankle inversion 5 5  Ankle eversion 5 5   (Blank rows = not tested)  LUMBAR SPECIAL TESTS:  Slump test: Positive and Single leg stance test: Positive Pain relief with left long leg hip distraction and inferior hip mobilization Lateral trunk lean and pelvic drop with attempted left SLS 3 sec  FUNCTIONAL TESTS:  Able to rise sit to stand without UE use GAIT: Comments: decreased left hip extension    TODAY'S TREATMENT  Plan of care   PATIENT EDUCATION:  Education details: use of lumbar roll, limit sitting; stop light system for LE symptoms Person educated: Patient Education method: Customer service manager Education comprehension: verbalized understanding   HOME EXERCISE PROGRAM: To be started  ASSESSMENT:  CLINICAL IMPRESSION: Patient is a 66 y.o. female who was seen today for physical therapy evaluation and treatment for lumbar radiculopathy with left buttock, posterior thigh and lower leg pain. No clear directional preference flexion vs. Extension.  +neural signs.  The patient would benefit from PT to address trunk and hip range of motion deficits, strength asymmetries in lumbo/pelvic  and hip regions and pain levels that are currently  affecting activities of daily living at home including sitting, standing, sleeping, walking, lifting and performing hobbies and recreational activities.      OBJECTIVE IMPAIRMENTS decreased activity tolerance, difficulty walking, decreased ROM, decreased strength, impaired flexibility, and pain.   ACTIVITY LIMITATIONS lifting, bending, sitting, standing, sleeping, stairs, hygiene/grooming, and locomotion level  PARTICIPATION LIMITATIONS: meal prep, cleaning, laundry, shopping, community activity, and yard work  PERSONAL FACTORS Time since onset of injury/illness/exacerbation and 1 comorbidity: breast cancer  are also affecting patient's functional outcome.   REHAB POTENTIAL: Good  CLINICAL DECISION MAKING: Stable/uncomplicated  EVALUATION COMPLEXITY: Low   GOALS: Goals reviewed with patient? Yes  SHORT TERM GOALS: Target date: 07/11/2022  The patient will demonstrate knowledge of basic self care strategies and exercises to promote healing   Baseline: Goal status: INITIAL  2.  The patient will report a 40% improvement in pain levels with functional activities  including feeding the animals in the mornings, sitting, walking Baseline:  Goal status: INITIAL  3.  The patient will have improved trunk flexor and extensor muscle strength to at least 4/5 needed for lifting medium weight objects such as grocery bags Baseline:  Goal status: INITIAL  4.  The patient will have improved hip strength to at least 4/5 needed for standing, walking longer distances and descending stairs at home and in the community  Baseline:  Goal status: INITIAL     LONG TERM GOALS: Target date: 08/08/2022  The patient will be independent in a safe self progression of a home exercise program to promote further recovery of function   Baseline:  Goal status: INITIAL  2.  The patient will report a 75% improvement in pain levels with functional activities  which are currently difficult including feeding the animals in the morning,  Baseline:  Goal status: INITIAL  3.  The patient will have improved trunk flexor and extensor muscle strength to at least 4+/5 needed for lifting medium weight objects such as laundry and luggage  Baseline:  Goal status: INITIAL  4.  The patient will have improved hip strength to at least 4+/5 needed for standing, walking longer distances and return to hiking Baseline:  Goal status: INITIAL  5.  The patient will have improved FOTO score to    64%   indicating improved function with less pain  Baseline:  Goal status: INITIAL  6.  The patient will be able to ambulate 1/2 mile with pain 3/10  PLAN: PT FREQUENCY: 1-2x/week  PT DURATION: 8 weeks  PLANNED INTERVENTIONS: Therapeutic exercises, Therapeutic activity, Neuromuscular re-education, Gait training, Patient/Family education, Self Care, Joint mobilization, Aquatic Therapy, Dry Needling, Spinal manipulation, Spinal mobilization, Cryotherapy, Moist heat, Taping, Traction, Ultrasound, Ionotophoresis '4mg'$ /ml Dexamethasone, Manual therapy, and Re-evaluation.  PLAN FOR NEXT SESSION: check MRI results from 10/11;  lateral left "opening" or gapping type ex's; manual traction or left hip long axis distraction and inferior mobs; neural flossing/gliding  Ruben Im, PT 06/13/22 8:21 PM Phone: [phone] Fax: [phone]

## 2022-06-14 ENCOUNTER — Telehealth: Payer: Self-pay

## 2022-06-14 DIAGNOSIS — M5416 Radiculopathy, lumbar region: Secondary | ICD-10-CM

## 2022-06-14 NOTE — Telephone Encounter (Signed)
Ms Deans's daughter contacted me and asked if you would be willing to prescribe meloxicam for her?   [place]

## 2022-06-15 ENCOUNTER — Encounter: Payer: BC Managed Care – PPO | Admitting: Physical Therapy

## 2022-06-15 ENCOUNTER — Other Ambulatory Visit: Payer: Self-pay | Admitting: Neurosurgery

## 2022-06-15 ENCOUNTER — Other Ambulatory Visit: Payer: Self-pay

## 2022-06-15 MED ORDER — MELOXICAM 15 MG PO TABS: 15.0000 mg | ORAL_TABLET | Freq: Every day | ORAL | 0 refills | Status: DC

## 2022-06-19 ENCOUNTER — Ambulatory Visit: Payer: Medicare Other

## 2022-06-19 DIAGNOSIS — M5416 Radiculopathy, lumbar region: Secondary | ICD-10-CM | POA: Diagnosis not present

## 2022-06-19 DIAGNOSIS — M6281 Muscle weakness (generalized): Secondary | ICD-10-CM

## 2022-06-19 DIAGNOSIS — R293 Abnormal posture: Secondary | ICD-10-CM

## 2022-06-19 NOTE — Therapy (Signed)
OUTPATIENT PHYSICAL THERAPY THORACOLUMBAR EVALUATION   Patient Name: Susan Benjamin MRN: [id number] DOB:11/19/1955, 66 y.o., female Today's Date: 06/19/2022   PT End of Session - 06/19/22 1058     Visit Number 2    Date for PT Re-Evaluation 08/08/22    Authorization Type BCBS, medicare    PT Start Time 1016    PT Stop Time 1059    PT Time Calculation (min) 43 min    Activity Tolerance Patient tolerated treatment well    Behavior During Therapy WFL for tasks assessed/performed              Past Medical History:  Diagnosis Date   Anxiety    Cancer ([place])    CIN I (cervical intraepithelial neoplasia I)    Ectopic pregnancy    X 2   Endometrial polyp    PONV (postoperative nausea and vomiting)    Seasonal allergies    Past Surgical History:  Procedure Laterality Date   BREAST LUMPECTOMY WITH RADIOACTIVE SEED AND SENTINEL LYMPH NODE BIOPSY Right 10/06/2021   Procedure: RIGHT BREAST LUMPECTOMY WITH RADIOACTIVE SEED AND SENTINEL LYMPH NODE BIOPSY;  Surgeon: Donnie Mesa, MD;  Location: [facility];  Service: General;  Laterality: Right;   CARPAL TUNNEL RELEASE     CERVICAL BIOPSY  W/ LOOP ELECTRODE EXCISION  2003   DILATION AND CURETTAGE OF UTERUS  2011   ECTOPIC PREGNANCY SURGERY     X 2-Right and Left salpingectomy   FOOT SURGERY     HYSTEROSCOPY  2011   endometrial polyp   PORTACATH PLACEMENT Right 10/06/2021   Procedure: INSERTION PORT-A-CATH;  Surgeon: Donnie Mesa, MD;  Location: [facility];  Service: General;  Laterality: Right;   TUBAL LIGATION     tubal lig and tubal reversal   VAGINAL HYSTERECTOMY  2012   Patient Active Problem List   Diagnosis Date Noted   Muscle cramps 01/09/2022   Normocytic normochromic anemia 01/09/2022   Dysgeusia 12/19/2021   Port-A-Cath in place 12/18/2021   Genetic testing 10/09/2021   Malignant neoplasm of upper-outer quadrant of right breast in female, estrogen receptor positive ([place]) 09/25/2021    Benign colon polyp 10/10/2012    PCP: [name] MD  REFERRING PROVIDER: Meade Maw MD  REFERRING DIAG: M54.16  Lumbar radiculopathy  Rationale for Evaluation and Treatment Rehabilitation  THERAPY DIAG:  Lumbar radiculopathy ONSET DATE: December 27, 2021  SUBJECTIVE:  SUBJECTIVE STATEMENT: Pt received MRI results and has stenosis and DDD in the lumbar spine.  Pt will have injection tomorrow. PERTINENT HISTORY:   Patient was diagnosed on 09/19/2021 with right 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 She underwent a right lumpectomy and sentinel node biopsy (3 negative nodes) on 10/06/2021  Are you having pain? Yes NPRS scale: 6/10 Pain location: Left buttock and left LE posterior thigh, calf to ankle pain  Aggravating factors: sitting (better with recliner), can't lie on left side; supine; mornings (feeding the animals) Relieving factors: Advil, gabapentin, naproxen; flex/rotation , prop LE on pillow ; cold pack  PRECAUTIONS: Other: right UE lymphedema risk  WEIGHT BEARING RESTRICTIONS No  FALLS:  Has patient fallen in last 6 months? No  LIVING ENVIRONMENT: Lives with: spouse Lives in: House/apartment Stairs: Yes: External: 3 steps; on right going up OCCUPATION: retired   PLOF: Independent  PATIENT GOALS pain to stop; I love the outdoors, hiking   OBJECTIVE:   DIAGNOSTIC FINDINGS:  X-ray "pinched nerve"  MRI: IMPRESSION: 1. Lumbar spine degeneration especially affecting the facets with L3-4 and L4-5 anterolisthesis. 2. L3-4 advanced spinal stenosis. 3. L4-5 bilateral subarticular recess stenosis that could affect either L5 nerve root.  PATIENT SURVEYS:  FOTO 53%  COGNITION:  Overall cognitive status: Within functional limits for tasks assessed     MUSCLE  LENGTH: Marcello Moores test: Right 15 deg; Left 0 deg  POSTURE: decreased lumbar lordosis  LUMBAR ROM:  Increased back pain with prone lying;  increased pain with DKTC  Active  A/PROM  eval  Flexion 50 pain  Extension 15  Right lateral flexion 35  Left lateral flexion 30  Right rotation   Left rotation    (Blank rows = not tested)  LOWER EXTREMITY ROM:   decreased left hip internal rotation 0 degrees and painful; decreased right hip external rotation 25 degrees  TRUNK STRENGTH:  Decreased activation of transverse abdominus muscles; abdominals 4-/5; decreased activation of lumbar multifidi; trunk extensors 4-/5 LOWER EXTREMITY MMT:    MMT Right eval Left eval  Hip flexion 5 4  Hip extension 5 4  Hip abduction 4+ 3+  Hip adduction    Hip internal rotation    Hip external rotation 5 3+  Knee flexion    Knee extension 5 4  Ankle dorsiflexion 5 5  Ankle plantarflexion 5 5  Ankle inversion 5 5  Ankle eversion 5 5   (Blank rows = not tested)  LUMBAR SPECIAL TESTS:  Slump test: Positive and Single leg stance test: Positive Pain relief with left long leg hip distraction and inferior hip mobilization Lateral trunk lean and pelvic drop with attempted left SLS 3 sec  FUNCTIONAL TESTS:  Able to rise sit to stand without UE use GAIT: Comments: decreased left hip extension  TODAY'S TREATMENT  Date: 06/19/22 HEP established-seated hamstring stretch, knee to chest, piriformis seated and trunk rotation 3x20 seconds Trigger Point Dry-Needling  Treatment instructions: Expect mild to moderate muscle soreness. S/S of pneumothorax if dry needled over a lung field, and to seek immediate medical attention should they occur. Patient verbalized understanding of these instructions and education.  Patient Consent Given: Yes Education handout provided: Yes Muscles treated: bil lumbar paraspinals, Lt gluteals Treatment response/outcome: Utilized skilled palpation to identify trigger points.   During dry needling able to palpate muscle twitch and muscle elongation  Elongation and release after dry needling  Skilled palpation and monitoring by PT during dry needling      PATIENT EDUCATION:  DN infoAccess Code: [email or web address] URL: [email or web address] Date: 06/19/2022 Prepared by: Claiborne Billings  Exercises - Seated Hamstring Stretch  - 3 x daily - 7 x weekly - 1 sets - 3 reps - 20 hold - Seated Figure 4 Piriformis Stretch  - 3 x daily - 7 x weekly - 1 sets - 3 reps - 20 hold - Supine Lower Trunk Rotation  - 3 x daily - 7 x weekly - 1 sets - 3 reps - 20 hold - Supine Single Knee to Chest  - 3 x daily - 7 x weekly - 1 sets - 3 reps - 20 hold  HOME EXERCISE PROGRAM: Access Code: [email or web address]  ASSESSMENT:  CLINICAL IMPRESSION: Pt received MRI results.   She has stenosis and DDD and will have injection.  Pt did well with gentle flexibility and will do this for HEP.  Pt with tension in bil lumbar multifidi and trigger points in Lt gluteals and had good response to DN today with improved tissue mobility and reduced tension reported post session.  Patient will benefit from skilled PT to address the below impairments and improve overall function.    OBJECTIVE IMPAIRMENTS decreased activity tolerance, difficulty walking, decreased ROM, decreased strength, impaired flexibility, and pain.   ACTIVITY LIMITATIONS lifting, bending, sitting, standing, sleeping, stairs, hygiene/grooming, and locomotion level  PARTICIPATION LIMITATIONS: meal prep, cleaning, laundry, shopping, community activity, and yard work  PERSONAL FACTORS Time since onset of injury/illness/exacerbation and 1 comorbidity: breast cancer  are also affecting patient's functional outcome.   REHAB POTENTIAL: Good  CLINICAL DECISION MAKING: Stable/uncomplicated  EVALUATION COMPLEXITY: Low   GOALS: Goals reviewed with patient? Yes  SHORT TERM GOALS: Target date: 07/11/2022  The patient will demonstrate knowledge of  basic self care strategies and exercises to promote healing   Baseline: Goal status: INITIAL  2.  The patient will report a 40% improvement in pain levels with functional activities  including feeding the animals in the mornings, sitting, walking Baseline:  Goal status: INITIAL  3.  The patient will have improved trunk flexor and extensor muscle strength to at least 4/5 needed for lifting medium weight objects such as grocery bags Baseline:  Goal status: INITIAL  4.  The patient will have improved hip strength to at least 4/5 needed for standing, walking longer distances and descending stairs at home and in the community  Baseline:  Goal status: INITIAL     LONG TERM GOALS: Target date: 08/08/2022  The patient will be independent in a safe self progression of a home exercise program to promote further recovery of function   Baseline:  Goal status: INITIAL  2.  The patient will report a 75% improvement in pain levels with functional activities which are currently difficult including feeding the animals in the morning,  Baseline:  Goal status: INITIAL  3.  The patient will have improved trunk flexor and extensor muscle strength to at least 4+/5 needed for lifting medium weight objects such as laundry and luggage  Baseline:  Goal status: INITIAL  4.  The patient will have improved hip strength to at least 4+/5 needed for standing, walking longer distances and return to hiking Baseline:  Goal status: INITIAL  5.  The patient will have improved FOTO score to    64%   indicating improved function with less pain  Baseline:  Goal status: INITIAL  6.  The patient will be able to ambulate 1/2 mile with pain 3/10  PLAN: PT FREQUENCY: 1-2x/week  PT DURATION: 8 weeks  PLANNED INTERVENTIONS: Therapeutic exercises, Therapeutic activity, Neuromuscular re-education, Gait training, Patient/Family education, Self Care, Joint mobilization, Aquatic Therapy, Dry Needling, Spinal  manipulation, Spinal mobilization, Cryotherapy, Moist heat, Taping, Traction, Ultrasound, Ionotophoresis '4mg'$ /ml Dexamethasone, Manual therapy, and Re-evaluation.  PLAN FOR NEXT SESSION: see how injection goes, assess response to DN, review HEP, lumbar traction, add core strength, encourage flexibility and mobility as pain allows  Sigurd Sos, PT 06/19/22 12:30 PM  Phone: [phone] Fax: [phone]

## 2022-06-19 NOTE — Patient Instructions (Signed)

## 2022-06-20 ENCOUNTER — Encounter: Payer: Self-pay | Admitting: Hematology and Oncology

## 2022-06-21 ENCOUNTER — Ambulatory Visit: Payer: Medicare Other

## 2022-06-21 DIAGNOSIS — R293 Abnormal posture: Secondary | ICD-10-CM

## 2022-06-21 DIAGNOSIS — M5416 Radiculopathy, lumbar region: Secondary | ICD-10-CM

## 2022-06-21 DIAGNOSIS — M6281 Muscle weakness (generalized): Secondary | ICD-10-CM

## 2022-06-21 NOTE — Therapy (Signed)
OUTPATIENT PHYSICAL THERAPY THORACOLUMBAR EVALUATION   Patient Name: Susan Benjamin MRN: [id number] DOB:December 30, 1955, 66 y.o., female Today's Date: 06/21/2022   PT End of Session - 06/21/22 0938     Visit Number 3    Date for PT Re-Evaluation 08/08/22    Authorization Type BCBS, medicare    PT Start Time [phone]    PT Stop Time 0933    PT Time Calculation (min) 46 min    Activity Tolerance Patient tolerated treatment well    Behavior During Therapy WFL for tasks assessed/performed               Past Medical History:  Diagnosis Date   Anxiety    Cancer ([place])    CIN I (cervical intraepithelial neoplasia I)    Ectopic pregnancy    X 2   Endometrial polyp    PONV (postoperative nausea and vomiting)    Seasonal allergies    Past Surgical History:  Procedure Laterality Date   BREAST LUMPECTOMY WITH RADIOACTIVE SEED AND SENTINEL LYMPH NODE BIOPSY Right 10/06/2021   Procedure: RIGHT BREAST LUMPECTOMY WITH RADIOACTIVE SEED AND SENTINEL LYMPH NODE BIOPSY;  Surgeon: Donnie Mesa, MD;  Location: [facility];  Service: General;  Laterality: Right;   CARPAL TUNNEL RELEASE     CERVICAL BIOPSY  W/ LOOP ELECTRODE EXCISION  2003   DILATION AND CURETTAGE OF UTERUS  2011   ECTOPIC PREGNANCY SURGERY     X 2-Right and Left salpingectomy   FOOT SURGERY     HYSTEROSCOPY  2011   endometrial polyp   PORTACATH PLACEMENT Right 10/06/2021   Procedure: INSERTION PORT-A-CATH;  Surgeon: Donnie Mesa, MD;  Location: [facility];  Service: General;  Laterality: Right;   TUBAL LIGATION     tubal lig and tubal reversal   VAGINAL HYSTERECTOMY  2012   Patient Active Problem List   Diagnosis Date Noted   Muscle cramps 01/09/2022   Normocytic normochromic anemia 01/09/2022   Dysgeusia 12/19/2021   Port-A-Cath in place 12/18/2021   Genetic testing 10/09/2021   Malignant neoplasm of upper-outer quadrant of right breast in female, estrogen receptor positive ([place])  09/25/2021   Benign colon polyp 10/10/2012    PCP: [name] MD  REFERRING PROVIDER: Meade Maw MD  REFERRING DIAG: M54.16  Lumbar radiculopathy  Rationale for Evaluation and Treatment Rehabilitation  THERAPY DIAG:  Lumbar radiculopathy ONSET DATE: December 27, 2021  SUBJECTIVE:  SUBJECTIVE STATEMENT: Injection really helped. I had relief of my Lt leg symptoms after injection.  Some soreness after the injection in my low back.    PERTINENT HISTORY:   Patient was diagnosed on 09/19/2021 with right 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 She underwent a right lumpectomy and sentinel node biopsy (3 negative nodes) on 10/06/2021  Are you having pain? Yes NPRS scale: 1-2/10 Pain location: Left buttock and left LE posterior thigh, calf to ankle pain  Aggravating factors: sitting (better with recliner), can't lie on left side; supine; mornings (feeding the animals) Relieving factors: Advil, gabapentin, naproxen; flex/rotation , prop LE on pillow ; cold pack  PRECAUTIONS: Other: right UE lymphedema risk  WEIGHT BEARING RESTRICTIONS No  FALLS:  Has patient fallen in last 6 months? No  LIVING ENVIRONMENT: Lives with: spouse Lives in: House/apartment Stairs: Yes: External: 3 steps; on right going up OCCUPATION: retired   PLOF: Independent  PATIENT GOALS pain to stop; I love the outdoors, hiking   OBJECTIVE:   DIAGNOSTIC FINDINGS:  X-ray "pinched nerve"  MRI: IMPRESSION: 1. Lumbar spine degeneration especially affecting the facets with L3-4 and L4-5 anterolisthesis. 2. L3-4 advanced spinal stenosis. 3. L4-5 bilateral subarticular recess stenosis that could affect either L5 nerve root.  PATIENT SURVEYS:  FOTO 53%  COGNITION:  Overall cognitive status: Within functional  limits for tasks assessed     MUSCLE LENGTH: Marcello Moores test: Right 15 deg; Left 0 deg  POSTURE: decreased lumbar lordosis  LUMBAR ROM:  Increased back pain with prone lying;  increased pain with DKTC  Active  A/PROM  eval  Flexion 50 pain  Extension 15  Right lateral flexion 35  Left lateral flexion 30  Right rotation   Left rotation    (Blank rows = not tested)  LOWER EXTREMITY ROM:   decreased left hip internal rotation 0 degrees and painful; decreased right hip external rotation 25 degrees  TRUNK STRENGTH:  Decreased activation of transverse abdominus muscles; abdominals 4-/5; decreased activation of lumbar multifidi; trunk extensors 4-/5 LOWER EXTREMITY MMT:    MMT Right eval Left eval  Hip flexion 5 4  Hip extension 5 4  Hip abduction 4+ 3+  Hip adduction    Hip internal rotation    Hip external rotation 5 3+  Knee flexion    Knee extension 5 4  Ankle dorsiflexion 5 5  Ankle plantarflexion 5 5  Ankle inversion 5 5  Ankle eversion 5 5   (Blank rows = not tested)  LUMBAR SPECIAL TESTS:  Slump test: Positive and Single leg stance test: Positive Pain relief with left long leg hip distraction and inferior hip mobilization Lateral trunk lean and pelvic drop with attempted left SLS 3 sec  FUNCTIONAL TESTS:  Able to rise sit to stand without UE use GAIT: Comments: decreased left hip extension  TODAY'S TREATMENT  Date: 06/21/22 HEP reviewed-seated hamstring stretch, knee to chest, piriformis seated and trunk rotation 3x20 seconds- good return demo Sciatic nerve glides x10 Sidelying clam x10 TA activation x10 Supine TA activation with ball squeeze x10 Body mechanics education Manual: Addaday to Lt gluteals and lumbar spine x 10 min  Date: 06/19/22 HEP established-seated hamstring stretch, knee to chest, piriformis seated and trunk rotation 3x20 seconds Trigger Point Dry-Needling  Treatment instructions: Expect mild to moderate muscle soreness. S/S of  pneumothorax if dry needled over a lung field, and to seek immediate medical attention should they occur. Patient verbalized understanding of these instructions and education.  Patient Consent  Given: Yes Education handout provided: Yes Muscles treated: bil lumbar paraspinals, Lt gluteals Treatment response/outcome: Utilized skilled palpation to identify trigger points.  During dry needling able to palpate muscle twitch and muscle elongation  Elongation and release after dry needling  Skilled palpation and monitoring by PT during dry needling     PATIENT EDUCATION:  Access Code: [email or web address], body mechanics (06/21/22) URL: [email or web address] Date: 06/21/2022 Prepared by: Claiborne Billings  Exercises - Seated Hamstring Stretch  - 3 x daily - 7 x weekly - 1 sets - 3 reps - 20 hold - Seated Figure 4 Piriformis Stretch  - 3 x daily - 7 x weekly - 1 sets - 3 reps - 20 hold - Supine Lower Trunk Rotation  - 3 x daily - 7 x weekly - 1 sets - 3 reps - 20 hold - Supine Single Knee to Chest  - 3 x daily - 7 x weekly - 1 sets - 3 reps - 20 hold - Clamshell  - 2 x daily - 7 x weekly - 2 sets - 10 reps - Seated Sciatic Tensioner  - 2 x daily - 7 x weekly - 1 sets - 10 reps - Supine Hip Adduction Isometric with Ball  - 2 x daily - 7 x weekly - 2 sets - 10 reps - 5 hold - Supine Transversus Abdominis Bracing - Hands on Stomach  - 1 x daily - 7 x weekly - 3 sets - 10 reps  HOME EXERCISE PROGRAM: Access Code: [email or web address]  ASSESSMENT:  CLINICAL IMPRESSION:  Pt had spinal injection yesterday and reports significant reduction in Lt LE pain today.  Pt demonstrated improved symmetry with ambulation and improved mobility with exercises.  She had good response to DN last session with reduced tension in the lumbar spine and Lt gluteals.  Session today focused on review of flexibility exercises, addition of strength for core and hips, body mechanics education and manual for tissue mobility.  Patient will benefit  from skilled PT to address the below impairments and improve overall function.    OBJECTIVE IMPAIRMENTS decreased activity tolerance, difficulty walking, decreased ROM, decreased strength, impaired flexibility, and pain.   ACTIVITY LIMITATIONS lifting, bending, sitting, standing, sleeping, stairs, hygiene/grooming, and locomotion level  PARTICIPATION LIMITATIONS: meal prep, cleaning, laundry, shopping, community activity, and yard work  PERSONAL FACTORS Time since onset of injury/illness/exacerbation and 1 comorbidity: breast cancer  are also affecting patient's functional outcome.   REHAB POTENTIAL: Good  CLINICAL DECISION MAKING: Stable/uncomplicated  EVALUATION COMPLEXITY: Low   GOALS: Goals reviewed with patient? Yes  SHORT TERM GOALS: Target date: 07/11/2022  The patient will demonstrate knowledge of basic self care strategies and exercises to promote healing   Baseline: body mechanics education today (06/20/22) Goal status: INITIAL  2.  The patient will report a 40% improvement in pain levels with functional activities  including feeding the animals in the mornings, sitting, walking Baseline:  Goal status: INITIAL  3.  The patient will have improved trunk flexor and extensor muscle strength to at least 4/5 needed for lifting medium weight objects such as grocery bags Baseline:  Goal status: INITIAL  4.  The patient will have improved hip strength to at least 4/5 needed for standing, walking longer distances and descending stairs at home and in the community  Baseline:  Goal status: INITIAL     LONG TERM GOALS: Target date: 08/08/2022  The patient will be independent in a safe self progression of a home exercise program to promote further recovery  of function   Baseline:  Goal status: INITIAL  2.  The patient will report a 75% improvement in pain levels with functional activities which are currently difficult including feeding the animals in the morning,  Baseline:   Goal status: INITIAL  3.  The patient will have improved trunk flexor and extensor muscle strength to at least 4+/5 needed for lifting medium weight objects such as laundry and luggage  Baseline:  Goal status: INITIAL  4.  The patient will have improved hip strength to at least 4+/5 needed for standing, walking longer distances and return to hiking Baseline:  Goal status: INITIAL  5.  The patient will have improved FOTO score to    64%   indicating improved function with less pain  Baseline:  Goal status: INITIAL  6.  The patient will be able to ambulate 1/2 mile with pain 3/10  PLAN: PT FREQUENCY: 1-2x/week  PT DURATION: 8 weeks  PLANNED INTERVENTIONS: Therapeutic exercises, Therapeutic activity, Neuromuscular re-education, Gait training, Patient/Family education, Self Care, Joint mobilization, Aquatic Therapy, Dry Needling, Spinal manipulation, Spinal mobilization, Cryotherapy, Moist heat, Taping, Traction, Ultrasound, Ionotophoresis '4mg'$ /ml Dexamethasone, Manual therapy, and Re-evaluation.  PLAN FOR NEXT SESSION:  Core strength, promote neutral movement and weightbearing, hip strength, DN again to lumbar and gluteals   Sigurd Sos, PT 06/21/22 9:39 AM  Phone: [phone] Fax: [phone]

## 2022-06-21 NOTE — Patient Instructions (Signed)
   Lifting Principles  Maintain proper posture and head alignment. Slide object as close as possible before lifting. Move obstacles out of the way. Test before lifting; ask for help if too heavy. Tighten stomach muscles without holding breath. Use smooth movements; do not jerk. Use legs to do the work, and pivot with feet. Distribute the work load symmetrically and close to the center of trunk. Push instead of pull whenever possible.   Squat down and hold basket close to stand. Use leg muscles to do the work.    Avoid twisting or bending back. Pivot around using foot movements, and bend at knees if needed when reaching for articles.        Getting Into / Out of Bed   Lower self to lie down on one side by raising legs and lowering head at the same time. Use arms to assist moving without twisting. Bend both knees to roll onto back if desired. To sit up, start from lying on side, and use same move-ments in reverse. Keep trunk aligned with legs.    Shift weight from front foot to back foot as item is lifted off shelf.    When leaning forward to pick object up from floor, extend one leg out behind. Keep back straight. Hold onto a sturdy support with other hand.      Sit upright, head facing forward. Try using a roll to support lower back. Keep shoulders relaxed, and avoid rounded back. Keep hips level with knees. Avoid crossing legs for long periods.     [facility] [address], [address] [place], [place] [address] Phone # [phone] Fax [phone]  

## 2022-06-26 ENCOUNTER — Ambulatory Visit: Payer: Medicare Other

## 2022-06-26 DIAGNOSIS — M5416 Radiculopathy, lumbar region: Secondary | ICD-10-CM

## 2022-06-26 DIAGNOSIS — R293 Abnormal posture: Secondary | ICD-10-CM

## 2022-06-26 DIAGNOSIS — M6281 Muscle weakness (generalized): Secondary | ICD-10-CM

## 2022-06-26 NOTE — Therapy (Signed)
OUTPATIENT PHYSICAL THERAPY TREATMENT  Patient Name: Susan Benjamin MRN: [id number] DOB:[birth year], 66 y.o., female Today's Date: 06/26/2022   PT End of Session - 06/26/22 1011     Visit Number 4    Date for PT Re-Evaluation 08/08/22    Authorization Type BCBS, medicare    PT Start Time 0935    PT Stop Time 1012    PT Time Calculation (min) 37 min    Activity Tolerance Patient tolerated treatment well    Behavior During Therapy WFL for tasks assessed/performed                Past Medical History:  Diagnosis Date   Anxiety    Cancer ([place])    CIN I (cervical intraepithelial neoplasia I)    Ectopic pregnancy    X 2   Endometrial polyp    PONV (postoperative nausea and vomiting)    Seasonal allergies    Past Surgical History:  Procedure Laterality Date   BREAST LUMPECTOMY WITH RADIOACTIVE SEED AND SENTINEL LYMPH NODE BIOPSY Right 10/06/2021   Procedure: RIGHT BREAST LUMPECTOMY WITH RADIOACTIVE SEED AND SENTINEL LYMPH NODE BIOPSY;  Surgeon: Donnie Mesa, MD;  Location: [place];  Service: General;  Laterality: Right;   CARPAL TUNNEL RELEASE     CERVICAL BIOPSY  W/ LOOP ELECTRODE EXCISION  2003   DILATION AND CURETTAGE OF UTERUS  2011   ECTOPIC PREGNANCY SURGERY     X 2-Right and Left salpingectomy   FOOT SURGERY     HYSTEROSCOPY  2011   endometrial polyp   PORTACATH PLACEMENT Right 10/06/2021   Procedure: INSERTION PORT-A-CATH;  Surgeon: Donnie Mesa, MD;  Location: [place];  Service: General;  Laterality: Right;   TUBAL LIGATION     tubal lig and tubal reversal   VAGINAL HYSTERECTOMY  2012   Patient Active Problem List   Diagnosis Date Noted   Muscle cramps 01/09/2022   Normocytic normochromic anemia 01/09/2022   Dysgeusia 12/19/2021   Port-A-Cath in place 12/18/2021   Genetic testing 10/09/2021   Malignant neoplasm of upper-outer quadrant of right breast in female, estrogen receptor positive ([place]) 09/25/2021   Benign  colon polyp 10/10/2012    PCP: [name] MD  REFERRING PROVIDER: Meade Maw MD  REFERRING DIAG: M54.16  Lumbar radiculopathy  Rationale for Evaluation and Treatment Rehabilitation  THERAPY DIAG:  Lumbar radiculopathy ONSET DATE: December 27, 2021  SUBJECTIVE:  SUBJECTIVE STATEMENT: I walked on my trip and I felt really good.  I got tired at the end of the day.  I am having a little bit of Lt LE pain now.     PERTINENT HISTORY:   Patient was diagnosed on 09/19/2021 with right 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 She underwent a right lumpectomy and sentinel node biopsy (3 negative nodes) on 10/06/2021  Are you having pain? Yes NPRS scale: 3/10 Pain location: Left buttock and left LE posterior thigh, calf to ankle pain  Aggravating factors: sitting (better with recliner), can't lie on left side; supine; mornings (feeding the animals) Relieving factors: Advil, gabapentin, naproxen; flex/rotation , prop LE on pillow ; cold pack  PRECAUTIONS: Other: right UE lymphedema risk  WEIGHT BEARING RESTRICTIONS No  FALLS:  Has patient fallen in last 6 months? No  LIVING ENVIRONMENT: Lives with: spouse Lives in: House/apartment Stairs: Yes: External: 3 steps; on right going up OCCUPATION: retired   PLOF: Independent  PATIENT GOALS pain to stop; I love the outdoors, hiking   OBJECTIVE:   DIAGNOSTIC FINDINGS:  X-ray "pinched nerve"  MRI: IMPRESSION: 1. Lumbar spine degeneration especially affecting the facets with L3-4 and L4-5 anterolisthesis. 2. L3-4 advanced spinal stenosis. 3. L4-5 bilateral subarticular recess stenosis that could affect either L5 nerve root.  PATIENT SURVEYS:  FOTO 53%  COGNITION:  Overall cognitive status: Within functional limits for tasks  assessed     MUSCLE LENGTH: Marcello Moores test: Right 15 deg; Left 0 deg  POSTURE: decreased lumbar lordosis  LUMBAR ROM:  Increased back pain with prone lying;  increased pain with DKTC  Active  A/PROM  eval  Flexion 50 pain  Extension 15  Right lateral flexion 35  Left lateral flexion 30  Right rotation   Left rotation    (Blank rows = not tested)  LOWER EXTREMITY ROM:   decreased left hip internal rotation 0 degrees and painful; decreased right hip external rotation 25 degrees  TRUNK STRENGTH:  Decreased activation of transverse abdominus muscles; abdominals 4-/5; decreased activation of lumbar multifidi; trunk extensors 4-/5 LOWER EXTREMITY MMT:    MMT Right eval Left eval  Hip flexion 5 4  Hip extension 5 4  Hip abduction 4+ 3+  Hip adduction    Hip internal rotation    Hip external rotation 5 3+  Knee flexion    Knee extension 5 4  Ankle dorsiflexion 5 5  Ankle plantarflexion 5 5  Ankle inversion 5 5  Ankle eversion 5 5   (Blank rows = not tested)  LUMBAR SPECIAL TESTS:  Slump test: Positive and Single leg stance test: Positive Pain relief with left long leg hip distraction and inferior hip mobilization Lateral trunk lean and pelvic drop with attempted left SLS 3 sec  FUNCTIONAL TESTS:  Able to rise sit to stand without UE use GAIT: Comments: decreased left hip extension  TODAY'S TREATMENT  Date: 06/25/22  TA activation x10 NuStep: Level 2x 6 minutes-PT present to discuss progress  Supine TA activation with ball squeeze x10 Body mechanics education  Trigger Point Dry-Needling  Treatment instructions: Expect mild to moderate muscle soreness. S/S of pneumothorax if dry needled over a lung field, and to seek immediate medical attention should they occur. Patient verbalized understanding of these instructions and education.  Patient Consent Given: Yes Education handout provided: Yes Muscles treated: bil lumbar paraspinals, Lt gluteals Treatment  response/outcome: Utilized skilled palpation to identify trigger points.  During dry needling able to palpate muscle  twitch and muscle elongation  Elongation and release after dry needling  Skilled palpation and monitoring by PT during dry needling    Date: 06/21/22 HEP reviewed-seated hamstring stretch, knee to chest, piriformis seated and trunk rotation 3x20 seconds- good return demo Sciatic nerve glides x10 Sidelying clam x10 TA activation x10 Supine TA activation with ball squeeze x10 Body mechanics education Manual: Addaday to Lt gluteals and lumbar spine x 10 min  Date: 06/19/22 HEP established-seated hamstring stretch, knee to chest, piriformis seated and trunk rotation 3x20 seconds Trigger Point Dry-Needling  Treatment instructions: Expect mild to moderate muscle soreness. S/S of pneumothorax if dry needled over a lung field, and to seek immediate medical attention should they occur. Patient verbalized understanding of these instructions and education.  Patient Consent Given: Yes Education handout provided: Yes Muscles treated: bil lumbar paraspinals, Lt gluteals Treatment response/outcome: Utilized skilled palpation to identify trigger points.  During dry needling able to palpate muscle twitch and muscle elongation  Elongation and release after dry needling  Skilled palpation and monitoring by PT during dry needling     PATIENT EDUCATION:  Access Code: [email or web address], body mechanics (06/21/22) URL: [email or web address] Date: 06/21/2022 Prepared by: Claiborne Billings  Exercises - Seated Hamstring Stretch  - 3 x daily - 7 x weekly - 1 sets - 3 reps - 20 hold - Seated Figure 4 Piriformis Stretch  - 3 x daily - 7 x weekly - 1 sets - 3 reps - 20 hold - Supine Lower Trunk Rotation  - 3 x daily - 7 x weekly - 1 sets - 3 reps - 20 hold - Supine Single Knee to Chest  - 3 x daily - 7 x weekly - 1 sets - 3 reps - 20 hold - Clamshell  - 2 x daily - 7 x weekly - 2 sets - 10 reps -  Seated Sciatic Tensioner  - 2 x daily - 7 x weekly - 1 sets - 10 reps - Supine Hip Adduction Isometric with Ball  - 2 x daily - 7 x weekly - 2 sets - 10 reps - 5 hold - Supine Transversus Abdominis Bracing - Hands on Stomach  - 1 x daily - 7 x weekly - 3 sets - 10 reps  HOME EXERCISE PROGRAM: Access Code: [email or web address]  ASSESSMENT:  CLINICAL IMPRESSION: Pt was on a trip to [place] and did a lot of walking.  She reports Lt LE pain that is mild now.  Gait is still more symmetrical than it was initially.  Session focused on review of HEP issued last session and DN/manual therapy to address muscle tension.  Pt with trigger points and good twitch response to DN with improved tissue mobility after manual therapy today.  Pt reports reduced Lt LE pain at the end of session.  Patient will benefit from skilled PT to address the below impairments and improve overall function.    OBJECTIVE IMPAIRMENTS decreased activity tolerance, difficulty walking, decreased ROM, decreased strength, impaired flexibility, and pain.   ACTIVITY LIMITATIONS lifting, bending, sitting, standing, sleeping, stairs, hygiene/grooming, and locomotion level  PARTICIPATION LIMITATIONS: meal prep, cleaning, laundry, shopping, community activity, and yard work  PERSONAL FACTORS Time since onset of injury/illness/exacerbation and 1 comorbidity: breast cancer  are also affecting patient's functional outcome.   REHAB POTENTIAL: Good  CLINICAL DECISION MAKING: Stable/uncomplicated  EVALUATION COMPLEXITY: Low   GOALS: Goals reviewed with patient? Yes  SHORT TERM GOALS: Target date: 07/11/2022  The patient will demonstrate knowledge of basic self care strategies  and exercises to promote healing   Baseline: body mechanics education today (06/20/22) Goal status: MET  2.  The patient will report a 40% improvement in pain levels with functional activities  including feeding the animals in the mornings, sitting,  walking Baseline: able to walk at [employer] (06/26/22) Goal status: MET  3.  The patient will have improved trunk flexor and extensor muscle strength to at least 4/5 needed for lifting medium weight objects such as grocery bags Baseline:  Goal status: INITIAL  4.  The patient will have improved hip strength to at least 4/5 needed for standing, walking longer distances and descending stairs at home and in the community  Baseline:  Goal status: INITIAL     LONG TERM GOALS: Target date: 08/08/2022  The patient will be independent in a safe self progression of a home exercise program to promote further recovery of function   Baseline:  Goal status: INITIAL  2.  The patient will report a 75% improvement in pain levels with functional activities which are currently difficult including feeding the animals in the morning,  Baseline:  Goal status: INITIAL  3.  The patient will have improved trunk flexor and extensor muscle strength to at least 4+/5 needed for lifting medium weight objects such as laundry and luggage  Baseline:  Goal status: INITIAL  4.  The patient will have improved hip strength to at least 4+/5 needed for standing, walking longer distances and return to hiking Baseline:  Goal status: INITIAL  5.  The patient will have improved FOTO score to    64%   indicating improved function with less pain  Baseline:  Goal status: INITIAL  6.  The patient will be able to ambulate 1/2 mile with pain 3/10  PLAN: PT FREQUENCY: 1-2x/week  PT DURATION: 8 weeks  PLANNED INTERVENTIONS: Therapeutic exercises, Therapeutic activity, Neuromuscular re-education, Gait training, Patient/Family education, Self Care, Joint mobilization, Aquatic Therapy, Dry Needling, Spinal manipulation, Spinal mobilization, Cryotherapy, Moist heat, Taping, Traction, Ultrasound, Ionotophoresis 4mg /ml Dexamethasone, Manual therapy, and Re-evaluation.  PLAN FOR NEXT SESSION:  Core strength, promote  neutral movement and weightbearing, hip strength, lumbar traction  Sigurd Sos, PT 06/26/22 10:14 AM  Phone: [phone] Fax: [phone]

## 2022-06-28 ENCOUNTER — Ambulatory Visit: Payer: Medicare Other

## 2022-06-28 DIAGNOSIS — M6281 Muscle weakness (generalized): Secondary | ICD-10-CM

## 2022-06-28 DIAGNOSIS — M5416 Radiculopathy, lumbar region: Secondary | ICD-10-CM

## 2022-06-28 DIAGNOSIS — R293 Abnormal posture: Secondary | ICD-10-CM

## 2022-06-28 NOTE — Therapy (Signed)
OUTPATIENT PHYSICAL THERAPY TREATMENT  Patient Name: Susan Benjamin MRN: [id number] DOB:1956/01/09, 66 y.o., female Today's Date: 06/28/2022   PT End of Session - 06/28/22 1015     Visit Number 5    Date for PT Re-Evaluation 08/08/22    Authorization Type BCBS, medicare    PT Start Time 0930    PT Stop Time 2010    PT Time Calculation (min) 45 min    Activity Tolerance Patient tolerated treatment well    Behavior During Therapy WFL for tasks assessed/performed                 Past Medical History:  Diagnosis Date   Anxiety    Cancer ([place])    CIN I (cervical intraepithelial neoplasia I)    Ectopic pregnancy    X 2   Endometrial polyp    PONV (postoperative nausea and vomiting)    Seasonal allergies    Past Surgical History:  Procedure Laterality Date   BREAST LUMPECTOMY WITH RADIOACTIVE SEED AND SENTINEL LYMPH NODE BIOPSY Right 10/06/2021   Procedure: RIGHT BREAST LUMPECTOMY WITH RADIOACTIVE SEED AND SENTINEL LYMPH NODE BIOPSY;  Surgeon: Donnie Mesa, MD;  Location: [place];  Service: General;  Laterality: Right;   CARPAL TUNNEL RELEASE     CERVICAL BIOPSY  W/ LOOP ELECTRODE EXCISION  2003   DILATION AND CURETTAGE OF UTERUS  2011   ECTOPIC PREGNANCY SURGERY     X 2-Right and Left salpingectomy   FOOT SURGERY     HYSTEROSCOPY  2011   endometrial polyp   PORTACATH PLACEMENT Right 10/06/2021   Procedure: INSERTION PORT-A-CATH;  Surgeon: Donnie Mesa, MD;  Location: [place];  Service: General;  Laterality: Right;   TUBAL LIGATION     tubal lig and tubal reversal   VAGINAL HYSTERECTOMY  2012   Patient Active Problem List   Diagnosis Date Noted   Muscle cramps 01/09/2022   Normocytic normochromic anemia 01/09/2022   Dysgeusia 12/19/2021   Port-A-Cath in place 12/18/2021   Genetic testing 10/09/2021   Malignant neoplasm of upper-outer quadrant of right breast in female, estrogen receptor positive ([place]) 09/25/2021   Benign  colon polyp 10/10/2012    PCP: [name] MD  REFERRING PROVIDER: Meade Maw MD  REFERRING DIAG: M54.16  Lumbar radiculopathy  Rationale for Evaluation and Treatment Rehabilitation  THERAPY DIAG:  Lumbar radiculopathy ONSET DATE: December 27, 2021  SUBJECTIVE:  SUBJECTIVE STATEMENT: I am still having some thigh and Lt LE pain below the knee.  I feel like I can rub it and make it feel better.     PERTINENT HISTORY:   Patient was diagnosed on 09/19/2021 with right 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 She underwent a right lumpectomy and sentinel node biopsy (3 negative nodes) on 10/06/2021  Are you having pain? Yes NPRS scale: 3/10 Pain location: Lt lower leg and lateral thigh  Aggravating factors: sitting (better with recliner), can't lie on left side; supine; mornings (feeding the animals) Relieving factors: Advil, gabapentin, naproxen; flex/rotation , prop LE on pillow ; cold pack  PRECAUTIONS: Other: right UE lymphedema risk  WEIGHT BEARING RESTRICTIONS No  FALLS:  Has patient fallen in last 6 months? No  LIVING ENVIRONMENT: Lives with: spouse Lives in: House/apartment Stairs: Yes: External: 3 steps; on right going up OCCUPATION: retired   PLOF: Independent  PATIENT GOALS pain to stop; I love the outdoors, hiking   OBJECTIVE:   DIAGNOSTIC FINDINGS:  X-ray "pinched nerve"  MRI: IMPRESSION: 1. Lumbar spine degeneration especially affecting the facets with L3-4 and L4-5 anterolisthesis. 2. L3-4 advanced spinal stenosis. 3. L4-5 bilateral subarticular recess stenosis that could affect either L5 nerve root.  PATIENT SURVEYS:  FOTO 53%  COGNITION:  Overall cognitive status: Within functional limits for tasks assessed     MUSCLE LENGTH: Marcello Moores test: Right 15  deg; Left 0 deg  POSTURE: decreased lumbar lordosis  LUMBAR ROM:  Increased back pain with prone lying;  increased pain with DKTC  Active  A/PROM  eval  Flexion 50 pain  Extension 15  Right lateral flexion 35  Left lateral flexion 30  Right rotation   Left rotation    (Blank rows = not tested)  LOWER EXTREMITY ROM:   decreased left hip internal rotation 0 degrees and painful; decreased right hip external rotation 25 degrees  TRUNK STRENGTH:  Decreased activation of transverse abdominus muscles; abdominals 4-/5; decreased activation of lumbar multifidi; trunk extensors 4-/5 LOWER EXTREMITY MMT:    MMT Right eval Left eval  Hip flexion 5 4  Hip extension 5 4  Hip abduction 4+ 3+  Hip adduction    Hip internal rotation    Hip external rotation 5 3+  Knee flexion    Knee extension 5 4  Ankle dorsiflexion 5 5  Ankle plantarflexion 5 5  Ankle inversion 5 5  Ankle eversion 5 5   (Blank rows = not tested)  LUMBAR SPECIAL TESTS:  Slump test: Positive and Single leg stance test: Positive Pain relief with left long leg hip distraction and inferior hip mobilization Lateral trunk lean and pelvic drop with attempted left SLS 3 sec  FUNCTIONAL TESTS:  Able to rise sit to stand without UE use GAIT: Comments: decreased left hip extension  TODAY'S TREATMENT  Date: 06/28/22 NuStep: Level 2x 6 minutes-PT present to discuss progress  Seated hamstring and figure 4 stretch 3x20 seconds  Sit to stand: focus on symmetry and gluteal activation 2x10 Standing hip abduction and extension 2x10 bil each Standing on balance pad: weight shifting 3 ways x1 each Addaday: Lt lateral quad, Tibialis and gluteals   Date: 06/25/22  TA activation x10 NuStep: Level 2x 6 minutes-PT present to discuss progress  Supine TA activation with ball squeeze x10 Body mechanics education  Trigger Point Dry-Needling  Treatment instructions: Expect mild to moderate muscle soreness. S/S of pneumothorax if  dry needled over a lung field, and to seek  immediate medical attention should they occur. Patient verbalized understanding of these instructions and education.  Patient Consent Given: Yes Education handout provided: Yes Muscles treated: bil lumbar paraspinals, Lt gluteals Treatment response/outcome: Utilized skilled palpation to identify trigger points.  During dry needling able to palpate muscle twitch and muscle elongation  Elongation and release after dry needling  Skilled palpation and monitoring by PT during dry needling    Date: 06/21/22 HEP reviewed-seated hamstring stretch, knee to chest, piriformis seated and trunk rotation 3x20 seconds- good return demo Sciatic nerve glides x10 Sidelying clam x10 TA activation x10 Supine TA activation with ball squeeze x10 Body mechanics education Manual: Addaday to Lt gluteals and lumbar spine x 10 min    PATIENT EDUCATION:  Access Code: [facility], body mechanics (06/21/22) URL: [email or web address] Date: 06/21/2022 Prepared by: Claiborne Billings  Exercises - Seated Hamstring Stretch  - 3 x daily - 7 x weekly - 1 sets - 3 reps - 20 hold - Seated Figure 4 Piriformis Stretch  - 3 x daily - 7 x weekly - 1 sets - 3 reps - 20 hold - Supine Lower Trunk Rotation  - 3 x daily - 7 x weekly - 1 sets - 3 reps - 20 hold - Supine Single Knee to Chest  - 3 x daily - 7 x weekly - 1 sets - 3 reps - 20 hold - Clamshell  - 2 x daily - 7 x weekly - 2 sets - 10 reps - Seated Sciatic Tensioner  - 2 x daily - 7 x weekly - 1 sets - 10 reps - Supine Hip Adduction Isometric with Ball  - 2 x daily - 7 x weekly - 2 sets - 10 reps - 5 hold - Supine Transversus Abdominis Bracing - Hands on Stomach  - 1 x daily - 7 x weekly - 3 sets - 10 reps  HOME EXERCISE PROGRAM: Access Code: [email or web address]  ASSESSMENT:  CLINICAL IMPRESSION: Pt continues to experience lateral thigh and calf pain that is 3/10.  Pt demonstrates improved symmetry with gait and PT worked on  symmetry with standing exercise and core activation. Pt did well with level pelvis and core activation.  Good response to Addaday with reduced pain after. Patient will benefit from skilled PT to address the below impairments and improve overall function.    OBJECTIVE IMPAIRMENTS decreased activity tolerance, difficulty walking, decreased ROM, decreased strength, impaired flexibility, and pain.   ACTIVITY LIMITATIONS lifting, bending, sitting, standing, sleeping, stairs, hygiene/grooming, and locomotion level  PARTICIPATION LIMITATIONS: meal prep, cleaning, laundry, shopping, community activity, and yard work  PERSONAL FACTORS Time since onset of injury/illness/exacerbation and 1 comorbidity: breast cancer  are also affecting patient's functional outcome.   REHAB POTENTIAL: Good  CLINICAL DECISION MAKING: Stable/uncomplicated  EVALUATION COMPLEXITY: Low   GOALS: Goals reviewed with patient? Yes  SHORT TERM GOALS: Target date: 07/11/2022  The patient will demonstrate knowledge of basic self care strategies and exercises to promote healing   Baseline: body mechanics education today (06/20/22) Goal status: MET  2.  The patient will report a 40% improvement in pain levels with functional activities  including feeding the animals in the mornings, sitting, walking Baseline: able to walk at [employer] (06/26/22) Goal status: MET  3.  The patient will have improved trunk flexor and extensor muscle strength to at least 4/5 needed for lifting medium weight objects such as grocery bags Baseline:  Goal status: INITIAL  4.  The patient will have improved hip strength to at least  4/5 needed for standing, walking longer distances and descending stairs at home and in the community  Baseline:  Goal status: INITIAL     LONG TERM GOALS: Target date: 08/08/2022  The patient will be independent in a safe self progression of a home exercise program to promote further recovery of function    Baseline:  Goal status: INITIAL  2.  The patient will report a 75% improvement in pain levels with functional activities which are currently difficult including feeding the animals in the morning,  Baseline:  Goal status: INITIAL  3.  The patient will have improved trunk flexor and extensor muscle strength to at least 4+/5 needed for lifting medium weight objects such as laundry and luggage  Baseline:  Goal status: INITIAL  4.  The patient will have improved hip strength to at least 4+/5 needed for standing, walking longer distances and return to hiking Baseline:  Goal status: INITIAL  5.  The patient will have improved FOTO score to    64%   indicating improved function with less pain  Baseline:  Goal status: INITIAL  6.  The patient will be able to ambulate 1/2 mile with pain 3/10  PLAN: PT FREQUENCY: 1-2x/week  PT DURATION: 8 weeks  PLANNED INTERVENTIONS: Therapeutic exercises, Therapeutic activity, Neuromuscular re-education, Gait training, Patient/Family education, Self Care, Joint mobilization, Aquatic Therapy, Dry Needling, Spinal manipulation, Spinal mobilization, Cryotherapy, Moist heat, Taping, Traction, Ultrasound, Ionotophoresis 4mg /ml Dexamethasone, Manual therapy, and Re-evaluation.  PLAN FOR NEXT SESSION:  Core strength, promote neutral movement and weightbearing, hip strength, lumbar traction, DN next week   Sigurd Sos, PT 06/28/22 10:18 AM  Phone: [phone] Fax: [phone]

## 2022-07-03 ENCOUNTER — Ambulatory Visit: Payer: Medicare Other

## 2022-07-04 ENCOUNTER — Telehealth: Payer: Self-pay | Admitting: Adult Health

## 2022-07-04 NOTE — Telephone Encounter (Signed)
Rescheduled appointment per provider PAL. Left voicemail. 

## 2022-07-05 ENCOUNTER — Ambulatory Visit: Payer: Medicare Other

## 2022-07-11 ENCOUNTER — Encounter: Payer: BC Managed Care – PPO | Admitting: Adult Health

## 2022-07-12 ENCOUNTER — Ambulatory Visit: Payer: Medicare Other | Attending: Surgery

## 2022-07-12 DIAGNOSIS — R293 Abnormal posture: Secondary | ICD-10-CM | POA: Insufficient documentation

## 2022-07-12 DIAGNOSIS — M5416 Radiculopathy, lumbar region: Secondary | ICD-10-CM | POA: Insufficient documentation

## 2022-07-12 DIAGNOSIS — M6281 Muscle weakness (generalized): Secondary | ICD-10-CM | POA: Insufficient documentation

## 2022-07-12 NOTE — Therapy (Signed)
OUTPATIENT PHYSICAL THERAPY TREATMENT  Patient Name: Susan Benjamin MRN: [id number] DOB:04-15-1956, 66 y.o., female Today's Date: 07/12/2022   PT End of Session - 07/12/22 0929     Visit Number 6    Date for PT Re-Evaluation 08/08/22    Authorization Type BCBS, medicare    PT Start Time [phone]    PT Stop Time 0932    PT Time Calculation (min) 42 min    Activity Tolerance Patient tolerated treatment well    Behavior During Therapy WFL for tasks assessed/performed                  Past Medical History:  Diagnosis Date   Anxiety    Cancer ([place])    CIN I (cervical intraepithelial neoplasia I)    Ectopic pregnancy    X 2   Endometrial polyp    PONV (postoperative nausea and vomiting)    Seasonal allergies    Past Surgical History:  Procedure Laterality Date   BREAST LUMPECTOMY WITH RADIOACTIVE SEED AND SENTINEL LYMPH NODE BIOPSY Right 10/06/2021   Procedure: RIGHT BREAST LUMPECTOMY WITH RADIOACTIVE SEED AND SENTINEL LYMPH NODE BIOPSY;  Surgeon: Donnie Mesa, MD;  Location: [place];  Service: General;  Laterality: Right;   CARPAL TUNNEL RELEASE     CERVICAL BIOPSY  W/ LOOP ELECTRODE EXCISION  2003   DILATION AND CURETTAGE OF UTERUS  2011   ECTOPIC PREGNANCY SURGERY     X 2-Right and Left salpingectomy   FOOT SURGERY     HYSTEROSCOPY  2011   endometrial polyp   PORTACATH PLACEMENT Right 10/06/2021   Procedure: INSERTION PORT-A-CATH;  Surgeon: Donnie Mesa, MD;  Location: [place];  Service: General;  Laterality: Right;   TUBAL LIGATION     tubal lig and tubal reversal   VAGINAL HYSTERECTOMY  2012   Patient Active Problem List   Diagnosis Date Noted   Muscle cramps 01/09/2022   Normocytic normochromic anemia 01/09/2022   Dysgeusia 12/19/2021   Port-A-Cath in place 12/18/2021   Genetic testing 10/09/2021   Malignant neoplasm of upper-outer quadrant of right breast in female, estrogen receptor positive ([place]) 09/25/2021   Benign  colon polyp 10/10/2012    PCP: [name] MD  REFERRING PROVIDER: Meade Maw MD  REFERRING DIAG: M54.16  Lumbar radiculopathy  Rationale for Evaluation and Treatment Rehabilitation  THERAPY DIAG:  Lumbar radiculopathy ONSET DATE: December 27, 2021  SUBJECTIVE:  SUBJECTIVE STATEMENT: I am having constant pain in the Lt lower leg and this has worsened.  The low back and Lt gluteals and upper leg are feeling better.     PERTINENT HISTORY:   Patient was diagnosed on 09/19/2021 with right 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 She underwent a right lumpectomy and sentinel node biopsy (3 negative nodes) on 10/06/2021  Are you having pain? Yes NPRS scale: 8-9/10 (Lt lower leg over tibialis anterior) Pain location: Lt lower leg and lateral thigh  Aggravating factors: it is constant, not impacted by position Relieving factors: Advil, gabapentin, naproxen; flex/rotation , prop LE on pillow ; cold pack  PRECAUTIONS: Other: right UE lymphedema risk  WEIGHT BEARING RESTRICTIONS No  FALLS:  Has patient fallen in last 6 months? No  LIVING ENVIRONMENT: Lives with: spouse Lives in: House/apartment Stairs: Yes: External: 3 steps; on right going up OCCUPATION: retired   PLOF: Independent  PATIENT GOALS pain to stop; I love the outdoors, hiking   OBJECTIVE:   DIAGNOSTIC FINDINGS:  X-ray "pinched nerve"  MRI: IMPRESSION: 1. Lumbar spine degeneration especially affecting the facets with L3-4 and L4-5 anterolisthesis. 2. L3-4 advanced spinal stenosis. 3. L4-5 bilateral subarticular recess stenosis that could affect either L5 nerve root.  MRI:  IMPRESSION: 1. Lumbar spine degeneration especially affecting the facets with L3-4 and L4-5 anterolisthesis. 2. L3-4 advanced spinal  stenosis. 3. L4-5 bilateral subarticular recess stenosis that could affect either L5 nerve root.  PATIENT SURVEYS:  FOTO 53%  COGNITION:  Overall cognitive status: Within functional limits for tasks assessed     MUSCLE LENGTH: Marcello Moores test: Right 15 deg; Left 0 deg  POSTURE: decreased lumbar lordosis  LUMBAR ROM:  Increased back pain with prone lying;  increased pain with DKTC  Active  A/PROM  eval  Flexion 50 pain  Extension 15  Right lateral flexion 35  Left lateral flexion 30  Right rotation   Left rotation    (Blank rows = not tested)  LOWER EXTREMITY ROM:   decreased left hip internal rotation 0 degrees and painful; decreased right hip external rotation 25 degrees  TRUNK STRENGTH:  Decreased activation of transverse abdominus muscles; abdominals 4-/5; decreased activation of lumbar multifidi; trunk extensors 4-/5 LOWER EXTREMITY MMT:    MMT Right eval Left eval  Hip flexion 5 4  Hip extension 5 4  Hip abduction 4+ 3+  Hip adduction    Hip internal rotation    Hip external rotation 5 3+  Knee flexion    Knee extension 5 4  Ankle dorsiflexion 5 5  Ankle plantarflexion 5 5  Ankle inversion 5 5  Ankle eversion 5 5   (Blank rows = not tested)  LUMBAR SPECIAL TESTS:  Slump test: Positive and Single leg stance test: Positive Pain relief with left long leg hip distraction and inferior hip mobilization Lateral trunk lean and pelvic drop with attempted left SLS 3 sec  FUNCTIONAL TESTS:  Able to rise sit to stand without UE use GAIT: Comments: decreased left hip extension  TODAY'S TREATMENT  Date: 07/12/22 Trigger Point Dry-Needling  Treatment instructions: Expect mild to moderate muscle soreness. S/S of pneumothorax if dry needled over a lung field, and to seek immediate medical attention should they occur. Patient verbalized understanding of these instructions and education.  Patient Consent Given: Yes Education handout provided: Previously provided Muscles  treated: Lt tibialis anterior  Treatment response/outcome: Utilized skilled palpation to identify trigger points.  During dry needling able to palpate muscle twitch  and muscle elongation  Elongation to Lt TA after DN Skilled palpation and monitoring by PT during dry needling  Lumbar mechanical traction: 90#/45# 60 second hold/10 seconds rest  Date: 06/28/22 NuStep: Level 2x 6 minutes-PT present to discuss progress  Seated hamstring and figure 4 stretch 3x20 seconds  Sit to stand: focus on symmetry and gluteal activation 2x10 Standing hip abduction and extension 2x10 bil each Standing on balance pad: weight shifting 3 ways x1 each [place]aday: Lt lateral quad, Tibialis and gluteals   Date: 06/25/22  TA activation x10 NuStep: Level 2x 6 minutes-PT present to discuss progress  Supine TA activation with ball squeeze x10 Body mechanics education  Trigger Point Dry-Needling  Treatment instructions: Expect mild to moderate muscle soreness. S/S of pneumothorax if dry needled over a lung field, and to seek immediate medical attention should they occur. Patient verbalized understanding of these instructions and education.  Patient Consent Given: Yes Education handout provided: Yes Muscles treated: bil lumbar paraspinals, Lt gluteals Treatment response/outcome: Utilized skilled palpation to identify trigger points.  During dry needling able to palpate muscle twitch and muscle elongation  Elongation and release after dry needling  Skilled palpation and monitoring by PT during dry needling      PATIENT EDUCATION:  Access Code: [email or web address], body mechanics (06/21/22) URL: [email or web address] Date: 06/21/2022 Prepared by: Claiborne Billings  Exercises - Seated Hamstring Stretch  - 3 x daily - 7 x weekly - 1 sets - 3 reps - 20 hold - Seated Figure 4 Piriformis Stretch  - 3 x daily - 7 x weekly - 1 sets - 3 reps - 20 hold - Supine Lower Trunk Rotation  - 3 x daily - 7 x weekly - 1 sets - 3 reps  - 20 hold - Supine Single Knee to Chest  - 3 x daily - 7 x weekly - 1 sets - 3 reps - 20 hold - Clamshell  - 2 x daily - 7 x weekly - 2 sets - 10 reps - Seated Sciatic Tensioner  - 2 x daily - 7 x weekly - 1 sets - 10 reps - Supine Hip Adduction Isometric with Ball  - 2 x daily - 7 x weekly - 2 sets - 10 reps - 5 hold - Supine Transversus Abdominis Bracing - Hands on Stomach  - 1 x daily - 7 x weekly - 3 sets - 10 reps  HOME EXERCISE PROGRAM: Access Code: [email or web address]  ASSESSMENT:  CLINICAL IMPRESSION: Pt reports significant reduction in back and Lt gluteal pain. She has had onset of Lt lower leg pain below knee at tibialis anterior that is constant and unrelated to activity.  Pt rates this pain as 8-9/10.  Session focused on DN and manual therapy to Lt tibialis anterior and trial of lumbar mechanical traction.  Pt reported reduced symptoms after treatment today. Patient will benefit from skilled PT to address the below impairments and improve overall function.    OBJECTIVE IMPAIRMENTS decreased activity tolerance, difficulty walking, decreased ROM, decreased strength, impaired flexibility, and pain.   ACTIVITY LIMITATIONS lifting, bending, sitting, standing, sleeping, stairs, hygiene/grooming, and locomotion level  PARTICIPATION LIMITATIONS: meal prep, cleaning, laundry, shopping, community activity, and yard work  PERSONAL FACTORS Time since onset of injury/illness/exacerbation and 1 comorbidity: breast cancer  are also affecting patient's functional outcome.   REHAB POTENTIAL: Good  CLINICAL DECISION MAKING: Stable/uncomplicated  EVALUATION COMPLEXITY: Low   GOALS: Goals reviewed with patient? Yes  SHORT TERM GOALS: Target date: 07/11/2022  The patient will demonstrate  knowledge of basic self care strategies and exercises to promote healing   Baseline: body mechanics education today (06/20/22) Goal status: MET  2.  The patient will report a 40% improvement in pain levels with  functional activities  including feeding the animals in the mornings, sitting, walking Baseline: able to walk at [employer] (06/26/22) Goal status: MET  3.  The patient will have improved trunk flexor and extensor muscle strength to at least 4/5 needed for lifting medium weight objects such as grocery bags Baseline:  Goal status: INITIAL  4.  The patient will have improved hip strength to at least 4/5 needed for standing, walking longer distances and descending stairs at home and in the community  Baseline:  Goal status: INITIAL     LONG TERM GOALS: Target date: 08/08/2022  The patient will be independent in a safe self progression of a home exercise program to promote further recovery of function   Baseline:  Goal status: INITIAL  2.  The patient will report a 75% improvement in pain levels with functional activities which are currently difficult including feeding the animals in the morning,  Baseline:  Goal status: INITIAL  3.  The patient will have improved trunk flexor and extensor muscle strength to at least 4+/5 needed for lifting medium weight objects such as laundry and luggage  Baseline:  Goal status: INITIAL  4.  The patient will have improved hip strength to at least 4+/5 needed for standing, walking longer distances and return to hiking Baseline:  Goal status: INITIAL  5.  The patient will have improved FOTO score to    64%   indicating improved function with less pain  Baseline:  Goal status: INITIAL  6.  The patient will be able to ambulate 1/2 mile with pain 3/10  PLAN: PT FREQUENCY: 1-2x/week  PT DURATION: 8 weeks  PLANNED INTERVENTIONS: Therapeutic exercises, Therapeutic activity, Neuromuscular re-education, Gait training, Patient/Family education, Self Care, Joint mobilization, Aquatic Therapy, Dry Needling, Spinal manipulation, Spinal mobilization, Cryotherapy, Moist heat, Taping, Traction, Ultrasound, Ionotophoresis 56m/ml Dexamethasone, Manual  therapy, and Re-evaluation.  PLAN FOR NEXT SESSION:  assess response to treatment and repeat if helpful, Core strength, promote neutral movement and weightbearing, hip strength  KSigurd Sos PT 07/12/22 9:30 AM  Phone: [phone]: [phone]

## 2022-07-16 ENCOUNTER — Telehealth: Payer: Self-pay

## 2022-07-16 ENCOUNTER — Encounter: Payer: BC Managed Care – PPO | Admitting: Adult Health

## 2022-07-16 NOTE — Progress Notes (Deleted)
SURVIVORSHIP VISIT:   BRIEF ONCOLOGIC HISTORY:  Oncology History  Malignant neoplasm of upper-outer quadrant of right breast in female, estrogen receptor positive ([place])  08/08/2021 Imaging   August 08, 2021, patient had bilateral screening mammogram which showed 2 possible masses in the right breast which warrant further evaluation.  No concerning findings in the left breast. She had diagnostic mammogram on 09/13/2021 which noted possible mass in the upper central breast at middle depth which persists on additional views is 1.9 cm oval mass with indistinct margins.  Second previously noted possible mass in the upper outer breast at anterior to middle depth dissipates on additional views consistent with overlapping fibroglandular tissue. Ultrasound showed suspicious 1.9 cm mass at the right breast 12 o'clock position for which ultrasound-guided biopsy was recommended no right axillary lymphadenopathy.   09/25/2021 Initial Diagnosis   Malignant neoplasm of upper-outer quadrant of right breast in female, estrogen receptor positive ([place])    Genetic Testing   [facility] CancerNext-Expanded Panel was Negative. 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 Report date is 10/20/2021.  MSH6 VUS (p.N112S) was reclassified to likely benign. Report date is 11/21/2021.  The CancerNext-Expanded gene panel offered by [facility] and includes sequencing, rearrangement, and RNA analysis for the following 77 genes: AIP, ALK, APC, ATM, AXIN2, BAP1, BARD1, BLM, BMPR1A, BRCA1, BRCA2, BRIP1, CDC73, CDH1, CDK4, CDKN1B, CDKN2A, CHEK2, CTNNA1, DICER1, FANCC, FH, FLCN, GALNT12, KIF1B, LZTR1, MAX, MEN1, MET, MLH1, MSH2, MSH3, MSH6, MUTYH, NBN, NF1, NF2, NTHL1, PALB2, PHOX2B, PMS2, POT1, PRKAR1A, PTCH1, PTEN, RAD51C, RAD51D, RB1, RECQL, RET, SDHA, SDHAF2, SDHB, SDHC, SDHD, SMAD4, SMARCA4, SMARCB1, SMARCE1, STK11, SUFU, TMEM127, TP53, TSC1, TSC2, VHL and XRCC2 (sequencing and  deletion/duplication); EGFR, EGLN1, HOXB13, KIT, MITF, PDGFRA, POLD1, and POLE (sequencing only); EPCAM and GREM1 (deletion/duplication only).    10/06/2021 Surgery   A. BREAST, RIGHT, LUMPECTOMY:  -  Invasive carcinoma of no special type (ductal), grade3 (total  Nottingham score 9), 25 mm in greatest dimension.  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  Prognostics from initial biopsy showed ER +30%, weak staining, PR 0%, negative, Ki-67 of 90%.  HER2 negative by IHC.     11/06/2021 - 01/11/2022 Chemotherapy   Patient is on Treatment Plan : BREAST TC q21d     11/27/2021 Cancer Staging   Staging form: Breast, AJCC 8th Edition - Pathologic: Stage IIA (pT2, pN0, cM0, G3, ER+, PR-, HER2-) - Signed by Gardenia Phlegm, NP on 11/27/2021 Histologic grading system: 3 grade system   02/13/2022 - 03/13/2022 Radiation Therapy   Site Technique Total Dose (Gy) Dose per Fx (Gy) Completed Fx Beam Energies  Breast, Right: Breast_R 3D 42.56/42.56 2.66 16/16 6XFFF  Breast, Right: Breast_R_Bst 3D 8/8 2 4/4 6X, 10X     03/2022 -  Anti-estrogen oral therapy   Anastrozole     INTERVAL HISTORY:  Susan Benjamin to review her survivorship care plan detailing her treatment course for breast cancer, as well as monitoring long-term side effects of that treatment, education regarding health maintenance, screening, and overall wellness and health promotion.     Overall, Susan Benjamin reports feeling quite well   REVIEW OF SYSTEMS:  Review of Systems  Constitutional:  Negative for appetite change, chills, fatigue, fever and unexpected weight change.  HENT:   Negative for hearing loss, lump/mass and trouble swallowing.   Eyes:  Negative for eye problems and icterus.  Respiratory:  Negative for chest tightness,  cough and shortness of breath.   Cardiovascular:  Negative for chest pain, leg swelling and palpitations.  Gastrointestinal:  Negative for abdominal distention,  abdominal pain, constipation, diarrhea, nausea and vomiting.  Endocrine: Negative for hot flashes.  Genitourinary:  Negative for difficulty urinating.   Musculoskeletal:  Negative for arthralgias.  Skin:  Negative for itching and rash.  Neurological:  Negative for dizziness, extremity weakness, headaches and numbness.  Hematological:  Negative for adenopathy. Does not bruise/bleed easily.  Psychiatric/Behavioral:  Negative for depression. The patient is not nervous/anxious.    Breast: Denies any new nodularity, masses, tenderness, nipple changes, or nipple discharge.      ONCOLOGY TREATMENT TEAM:  1. Surgeon:  Dr. Georgette Dover at [facility] 2. Medical Oncologist: Dr. Chryl Heck 3. Radiation Oncologist: Dr. Lisbeth Renshaw    PAST MEDICAL/SURGICAL HISTORY:  Past Medical History:  Diagnosis Date   Anxiety    Cancer ([place])    CIN I (cervical intraepithelial neoplasia I)    Ectopic pregnancy    X 2   Endometrial polyp    PONV (postoperative nausea and vomiting)    Seasonal allergies    Past Surgical History:  Procedure Laterality Date   BREAST LUMPECTOMY WITH RADIOACTIVE SEED AND SENTINEL LYMPH NODE BIOPSY Right 10/06/2021   Procedure: RIGHT BREAST LUMPECTOMY WITH RADIOACTIVE SEED AND SENTINEL LYMPH NODE BIOPSY;  Surgeon: Donnie Mesa, MD;  Location: [facility];  Service: General;  Laterality: Right;   CARPAL TUNNEL RELEASE     CERVICAL BIOPSY  W/ LOOP ELECTRODE EXCISION  2003   DILATION AND CURETTAGE OF UTERUS  2011   ECTOPIC PREGNANCY SURGERY     X 2-Right and Left salpingectomy   FOOT SURGERY     HYSTEROSCOPY  2011   endometrial polyp   PORTACATH PLACEMENT Right 10/06/2021   Procedure: INSERTION PORT-A-CATH;  Surgeon: Donnie Mesa, MD;  Location: [facility];  Service: General;  Laterality: Right;   TUBAL LIGATION     tubal lig and tubal reversal   VAGINAL HYSTERECTOMY  2012     ALLERGIES:  No Known Allergies   CURRENT MEDICATIONS:   Outpatient Encounter Medications as of 07/16/2022  Medication Sig   anastrozole (ARIMIDEX) 1 MG tablet Take 1 tablet (1 mg total) by mouth daily.   gabapentin (NEURONTIN) 300 MG capsule Take 1 capsule (300 mg total) by mouth 3 (three) times daily.   meloxicam (MOBIC) 15 MG tablet Take 1 tablet (15 mg total) by mouth daily.   methocarbamol (ROBAXIN) 500 MG tablet Take 1 tablet (500 mg total) by mouth every 6 (six) hours as needed for muscle spasms.   No facility-administered encounter medications on file as of 07/16/2022.     ONCOLOGIC FAMILY HISTORY:  Family History  Problem Relation Age of Onset   Diabetes Mother    Hypertension Mother    Stroke Mother    Heart disease Mother    Hypertension Father    Melanoma Father 74       metastasized   Breast cancer Cousin        dx. 19s     SOCIAL HISTORY:  Social History   Socioeconomic History   Marital status: Married    Spouse name: Not on file   Number of children: Not on file   Years of education: Not on file   Highest education level: Not on file  Occupational History   Not on file  Tobacco Use   Smoking status: Never   Smokeless tobacco: Never  Vaping Use   Vaping Use: Never used  Substance and Sexual Activity   Alcohol use: Not Currently    Comment: rarely   Drug use: No   Sexual activity: Yes    Birth control/protection: Surgical    Comment: INTERCOURSE AGE 46, SEXUAL PARTNERS LESS THAN 5  Other Topics Concern   Not on file  Social History Narrative   Not on file   Social Determinants of Health   Financial Resource Strain: Not on file  Food Insecurity: Not on file  Transportation Needs: Not on file  Physical Activity: Not on file  Stress: Not on file  Social Connections: Not on file  Intimate Partner Violence: Not on file     OBSERVATIONS/OBJECTIVE:  There were no vitals taken for this visit. GENERAL: Patient is a well appearing female in no acute distress HEENT:  Sclerae anicteric.  Oropharynx  clear and moist. No ulcerations or evidence of oropharyngeal candidiasis. Neck is supple.  NODES:  No cervical, supraclavicular, or axillary lymphadenopathy palpated.  BREAST EXAM:  Deferred. LUNGS:  Clear to auscultation bilaterally.  No wheezes or rhonchi. HEART:  Regular rate and rhythm. No murmur appreciated. ABDOMEN:  Soft, nontender.  Positive, normoactive bowel sounds. No organomegaly palpated. MSK:  No focal spinal tenderness to palpation. Full range of motion bilaterally in the upper extremities. EXTREMITIES:  No peripheral edema.   SKIN:  Clear with no obvious rashes or skin changes. No nail dyscrasia. NEURO:  Nonfocal. Well oriented.  Appropriate affect.   LABORATORY DATA:  None for this visit.  DIAGNOSTIC IMAGING:  None for this visit.      ASSESSMENT AND PLAN:  Susan Benjamin is a pleasant 66 y.o. female with Stage IIA right breast invasive ductal carcinoma, ER+/PR+/HER2-, diagnosed in 08/2021, treated with lumpectomy, adjuvant chemotherapy, adjuvant radiation therapy, and anti-estrogen therapy with Anastrozole beginning in 03/2022.  She presents to the Survivorship Clinic for our initial meeting and routine follow-up post-completion of treatment for breast cancer.    1. Stage IIA right breast cancer:  Susan Benjamin is continuing to recover from definitive treatment for breast cancer. She will follow-up with her medical oncologist, Dr. Chryl Heck in 10/2022 with history and physical exam per surveillance protocol.  She will continue her anti-estrogen therapy with Anastrozole. Thus far, she is tolerating the Anastrozole well, with minimal side effects. She was instructed to make Dr. Lindi Adie or myself aware if she begins to experience any worsening side effects of the medication and I could see her back in clinic to help manage those side effects, as needed. Her mammogram is due 08/2022; orders placed today.   Today, a comprehensive survivorship care plan and treatment summary was reviewed  with the patient today detailing her breast cancer diagnosis, treatment course, potential late/long-term effects of treatment, appropriate follow-up care with recommendations for the future, and patient education resources.  A copy of this summary, along with a letter will be sent to the patient's primary care provider via mail/fax/In Basket message after today's visit.    #. Problem(s) at Visit______________  #. Bone health:  Given Susan Benjamin's age/history of breast cancer and her current treatment regimen including anti-estrogen therapy with Anastrozole, she is at risk for bone demineralization.  She is scheduled for bone density testing in December 2023.  She was given education on specific activities to promote bone health.  #. Cancer screening:  Due to Susan Benjamin's history and her age, she should receive screening for skin cancers, colon cancer, and gynecologic cancers.  The information and recommendations are listed on the patient's comprehensive care plan/treatment summary and were reviewed in detail with the patient.    #. Health maintenance and wellness promotion: Susan Benjamin was encouraged to consume 5-7 servings of fruits and vegetables per day. We reviewed the "Nutrition Rainbow" handout.  She was also encouraged to engage in moderate to vigorous exercise for 30 minutes per day most days of the week. We discussed the [employer] fitness program, which is designed for cancer survivors to help them become more physically fit after cancer treatments.  She was instructed to limit her alcohol consumption and continue to abstain from tobacco use.     #. Support services/counseling: It is not uncommon for this period of the patient's cancer care trajectory to be one of many emotions and stressors.  She was given information regarding our available services and encouraged to contact me with any questions or for help enrolling in any of our support group/programs.    Follow up instructions:     -Return to cancer center 10/2022 for f/u with Dr. Chryl Heck  -Mammogram due in 08/2022 -Bone density 08/2022 -She is welcome to return back to the Survivorship Clinic at any time; no additional follow-up needed at this time.  -Consider referral back to survivorship as a long-term survivor for continued surveillance  The patient was provided an opportunity to ask questions and all were answered. The patient agreed with the plan and demonstrated an understanding of the instructions.   Total encounter time:*** minutes*in face-to-face visit time, chart review, lab review, care coordination, order entry, and documentation of the encounter time.    Wilber Bihari, NP 07/16/22 8:31 AM Medical Oncology and Hematology [facility] [place], [place] [address] Tel. [phone]    Fax. [phone]  *Total Encounter Time as defined by the Centers for Medicare and Medicaid Services includes, in addition to the face-to-face time of a patient visit (documented in the note above) non-face-to-face time: obtaining and reviewing outside history, ordering and reviewing medications, tests or procedures, care coordination (communications with other health care professionals or caregivers) and documentation in the medical record.

## 2022-07-16 NOTE — Telephone Encounter (Signed)
Called pt this morning in regards to her SCP visit with NP. Pt was [place] to appt and states she was not aware she had an appt. Message sent to scheduler to call pt to r/s appt.

## 2022-07-18 ENCOUNTER — Other Ambulatory Visit: Payer: Self-pay | Admitting: Neurosurgery

## 2022-07-18 MED ORDER — METHOCARBAMOL 500 MG PO TABS: 500.0000 mg | ORAL_TABLET | Freq: Four times a day (QID) | ORAL | 0 refills | Status: DC | PRN

## 2022-07-18 MED ORDER — MELOXICAM 15 MG PO TABS: 15.0000 mg | ORAL_TABLET | Freq: Every day | ORAL | 0 refills | Status: DC

## 2022-07-19 ENCOUNTER — Ambulatory Visit: Payer: Medicare Other

## 2022-07-19 ENCOUNTER — Telehealth: Payer: Self-pay | Admitting: Adult Health

## 2022-07-19 DIAGNOSIS — R293 Abnormal posture: Secondary | ICD-10-CM

## 2022-07-19 DIAGNOSIS — M5416 Radiculopathy, lumbar region: Secondary | ICD-10-CM | POA: Diagnosis not present

## 2022-07-19 DIAGNOSIS — M6281 Muscle weakness (generalized): Secondary | ICD-10-CM

## 2022-07-19 NOTE — Telephone Encounter (Signed)
Rescheduled appointment per staff message. Left voicemail.

## 2022-07-19 NOTE — Therapy (Signed)
OUTPATIENT PHYSICAL THERAPY TREATMENT  Patient Name: Susan Benjamin MRN: [id number] DOB:1956-06-30, 66 y.o., female Today's Date: 07/19/2022   PT End of Session - 07/19/22 0920     Visit Number 7    Date for PT Re-Evaluation 08/08/22    Authorization Type BCBS, medicare    PT Start Time [phone]    PT Stop Time 0935    PT Time Calculation (min) 48 min    Activity Tolerance Patient tolerated treatment well    Behavior During Therapy WFL for tasks assessed/performed                   Past Medical History:  Diagnosis Date   Anxiety    Cancer ([place])    CIN I (cervical intraepithelial neoplasia I)    Ectopic pregnancy    X 2   Endometrial polyp    PONV (postoperative nausea and vomiting)    Seasonal allergies    Past Surgical History:  Procedure Laterality Date   BREAST LUMPECTOMY WITH RADIOACTIVE SEED AND SENTINEL LYMPH NODE BIOPSY Right 10/06/2021   Procedure: RIGHT BREAST LUMPECTOMY WITH RADIOACTIVE SEED AND SENTINEL LYMPH NODE BIOPSY;  Surgeon: Donnie Mesa, MD;  Location: [place];  Service: General;  Laterality: Right;   CARPAL TUNNEL RELEASE     CERVICAL BIOPSY  W/ LOOP ELECTRODE EXCISION  2003   DILATION AND CURETTAGE OF UTERUS  2011   ECTOPIC PREGNANCY SURGERY     X 2-Right and Left salpingectomy   FOOT SURGERY     HYSTEROSCOPY  2011   endometrial polyp   PORTACATH PLACEMENT Right 10/06/2021   Procedure: INSERTION PORT-A-CATH;  Surgeon: Donnie Mesa, MD;  Location: [place];  Service: General;  Laterality: Right;   TUBAL LIGATION     tubal lig and tubal reversal   VAGINAL HYSTERECTOMY  2012   Patient Active Problem List   Diagnosis Date Noted   Muscle cramps 01/09/2022   Normocytic normochromic anemia 01/09/2022   Dysgeusia 12/19/2021   Port-A-Cath in place 12/18/2021   Genetic testing 10/09/2021   Malignant neoplasm of upper-outer quadrant of right breast in female, estrogen receptor positive ([place]) 09/25/2021    Benign colon polyp 10/10/2012    PCP: [name] MD  REFERRING PROVIDER: Meade Maw MD  REFERRING DIAG: M54.16  Lumbar radiculopathy  Rationale for Evaluation and Treatment Rehabilitation  THERAPY DIAG:  Lumbar radiculopathy ONSET DATE: December 27, 2021  SUBJECTIVE:  SUBJECTIVE STATEMENT: My leg pain is so much better.  The pain in my leg went away after I left here. I am still stretching.    PERTINENT HISTORY:   Patient was diagnosed on 09/19/2021 with right 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 She underwent a right lumpectomy and sentinel node biopsy (3 negative nodes) on 10/06/2021  Are you having pain? Yes NPRS scale: 2/10  Pain location: Lt gluteal Aggravating factors: it is constant, not impacted by position Relieving factors: Advil, gabapentin, naproxen; flex/rotation , prop LE on pillow ; cold pack  PRECAUTIONS: Other: right UE lymphedema risk  WEIGHT BEARING RESTRICTIONS No  FALLS:  Has patient fallen in last 6 months? No  LIVING ENVIRONMENT: Lives with: spouse Lives in: House/apartment Stairs: Yes: External: 3 steps; on right going up OCCUPATION: retired   PLOF: Independent  PATIENT GOALS pain to stop; I love the outdoors, hiking   OBJECTIVE:   DIAGNOSTIC FINDINGS:  X-ray "pinched nerve"  MRI: IMPRESSION: 1. Lumbar spine degeneration especially affecting the facets with L3-4 and L4-5 anterolisthesis. 2. L3-4 advanced spinal stenosis. 3. L4-5 bilateral subarticular recess stenosis that could affect either L5 nerve root.  MRI:  IMPRESSION: 1. Lumbar spine degeneration especially affecting the facets with L3-4 and L4-5 anterolisthesis. 2. L3-4 advanced spinal stenosis. 3. L4-5 bilateral subarticular recess stenosis that could affect either L5  nerve root.  PATIENT SURVEYS:  FOTO 53%  COGNITION:  Overall cognitive status: Within functional limits for tasks assessed     MUSCLE LENGTH: Marcello Moores test: Right 15 deg; Left 0 deg  POSTURE: decreased lumbar lordosis  LUMBAR ROM:  Increased back pain with prone lying;  increased pain with DKTC  Active  A/PROM  eval  Flexion 50 pain  Extension 15  Right lateral flexion 35  Left lateral flexion 30  Right rotation   Left rotation    (Blank rows = not tested)  LOWER EXTREMITY ROM:   decreased left hip internal rotation 0 degrees and painful; decreased right hip external rotation 25 degrees  TRUNK STRENGTH:  Decreased activation of transverse abdominus muscles; abdominals 4-/5; decreased activation of lumbar multifidi; trunk extensors 4-/5 LOWER EXTREMITY MMT:    MMT Right eval Left eval  Hip flexion 5 4  Hip extension 5 4  Hip abduction 4+ 3+  Hip adduction    Hip internal rotation    Hip external rotation 5 3+  Knee flexion    Knee extension 5 4  Ankle dorsiflexion 5 5  Ankle plantarflexion 5 5  Ankle inversion 5 5  Ankle eversion 5 5   (Blank rows = not tested)  LUMBAR SPECIAL TESTS:  Slump test: Positive and Single leg stance test: Positive Pain relief with left long leg hip distraction and inferior hip mobilization Lateral trunk lean and pelvic drop with attempted left SLS 3 sec  FUNCTIONAL TESTS:  Able to rise sit to stand without UE use GAIT: Comments: decreased left hip extension  TODAY'S TREATMENT  Date: 07/19/22 Trigger Point Dry-Needling  Treatment instructions: Expect mild to moderate muscle soreness. S/S of pneumothorax if dry needled over a lung field, and to seek immediate medical attention should they occur. Patient verbalized understanding of these instructions and education.  Patient Consent Given: Yes Education handout provided: Previously provided Muscles treated: bil lumbar spine and Bil gluteals Treatment response/outcome: Utilized  skilled palpation to identify trigger points.  During dry needling able to palpate muscle twitch and muscle elongation  Elongation to Lt TA after DN Skilled palpation and  monitoring by PT during dry needling  Lumbar mechanical traction: 90#/45# 60 second hold/10 seconds rest Date: 07/12/22 Trigger Point Dry-Needling  Treatment instructions: Expect mild to moderate muscle soreness. S/S of pneumothorax if dry needled over a lung field, and to seek immediate medical attention should they occur. Patient verbalized understanding of these instructions and education.  Patient Consent Given: Yes Education handout provided: Previously provided Muscles treated: Lt tibialis anterior  Treatment response/outcome: Utilized skilled palpation to identify trigger points.  During dry needling able to palpate muscle twitch and muscle elongation  Elongation to Lt TA after DN Skilled palpation and monitoring by PT during dry needling  Lumbar mechanical traction: 90#/45# 60 second hold/10 seconds rest  Date: 06/28/22 NuStep: Level 2x 6 minutes-PT present to discuss progress  Seated hamstring and figure 4 stretch 3x20 seconds  Sit to stand: focus on symmetry and gluteal activation 2x10 Standing hip abduction and extension 2x10 bil each Standing on balance pad: weight shifting 3 ways x1 each [place]: Lt lateral quad, Tibialis and gluteals   PATIENT EDUCATION:  Access Code: [email or web address], body mechanics (06/21/22) URL: [email or web address] Date: 06/21/2022 Prepared by: Claiborne Billings  Exercises - Seated Hamstring Stretch  - 3 x daily - 7 x weekly - 1 sets - 3 reps - 20 hold - Seated Figure 4 Piriformis Stretch  - 3 x daily - 7 x weekly - 1 sets - 3 reps - 20 hold - Supine Lower Trunk Rotation  - 3 x daily - 7 x weekly - 1 sets - 3 reps - 20 hold - Supine Single Knee to Chest  - 3 x daily - 7 x weekly - 1 sets - 3 reps - 20 hold - Clamshell  - 2 x daily - 7 x weekly - 2 sets - 10 reps - Seated Sciatic  Tensioner  - 2 x daily - 7 x weekly - 1 sets - 10 reps - Supine Hip Adduction Isometric with Ball  - 2 x daily - 7 x weekly - 2 sets - 10 reps - 5 hold - Supine Transversus Abdominis Bracing - Hands on Stomach  - 1 x daily - 7 x weekly - 3 sets - 10 reps  HOME EXERCISE PROGRAM: Access Code: [email or web address]  ASSESSMENT:  CLINICAL IMPRESSION: Pt arrived with significant reduction in Lt LE pain after last session.  Pt reports 3/10 pain in gluteals and no longer with pain in the lower leg.  Pt is independent and compliant in HEP for strength and flexibility.  Pt with tension in Lt>Lt lumbar muscles and gluteals with twitch response to DN and improved tissue mobility today. Patient will benefit from skilled PT to address the below impairments and improve overall function.    OBJECTIVE IMPAIRMENTS decreased activity tolerance, difficulty walking, decreased ROM, decreased strength, impaired flexibility, and pain.   ACTIVITY LIMITATIONS lifting, bending, sitting, standing, sleeping, stairs, hygiene/grooming, and locomotion level  PARTICIPATION LIMITATIONS: meal prep, cleaning, laundry, shopping, community activity, and yard work  PERSONAL FACTORS Time since onset of injury/illness/exacerbation and 1 comorbidity: breast cancer  are also affecting patient's functional outcome.   REHAB POTENTIAL: Good  CLINICAL DECISION MAKING: Stable/uncomplicated  EVALUATION COMPLEXITY: Low   GOALS: Goals reviewed with patient? Yes  SHORT TERM GOALS: Target date: 07/11/2022  The patient will demonstrate knowledge of basic self care strategies and exercises to promote healing   Baseline: body mechanics education today (06/20/22) Goal status: MET  2.  The patient will report a 40% improvement in pain levels with functional activities  including feeding the animals in the mornings, sitting, walking Baseline: able to walk at [employer] (06/26/22) Goal status: MET  3.  The patient will have improved trunk  flexor and extensor muscle strength to at least 4/5 needed for lifting medium weight objects such as grocery bags Baseline:  Goal status: INITIAL  4.  The patient will have improved hip strength to at least 4/5 needed for standing, walking longer distances and descending stairs at home and in the community  Baseline:  Goal status: INITIAL     LONG TERM GOALS: Target date: 08/08/2022  The patient will be independent in a safe self progression of a home exercise program to promote further recovery of function   Baseline:  Goal status: INITIAL  2.  The patient will report a 75% improvement in pain levels with functional activities which are currently difficult including feeding the animals in the morning,  Baseline: 60% (07/19/22) Goal status: in progress   3.  The patient will have improved trunk flexor and extensor muscle strength to at least 4+/5 needed for lifting medium weight objects such as laundry and luggage  Baseline:  Goal status: INITIAL  4.  The patient will have improved hip strength to at least 4+/5 needed for standing, walking longer distances and return to hiking Baseline:  Goal status: INITIAL  5.  The patient will have improved FOTO score to  64%  indicating improved function with less pain  Baseline:  Goal status: INITIAL  6.  The patient will be able to ambulate 1/2 mile with pain 3/10  MET  PLAN: PT FREQUENCY: 1-2x/week  PT DURATION: 8 weeks  PLANNED INTERVENTIONS: Therapeutic exercises, Therapeutic activity, Neuromuscular re-education, Gait training, Patient/Family education, Self Care, Joint mobilization, Aquatic Therapy, Dry Needling, Spinal manipulation, Spinal mobilization, Cryotherapy, Moist heat, Taping, Traction, Ultrasound, Ionotophoresis 66m/ml Dexamethasone, Manual therapy, and Re-evaluation.  PLAN FOR NEXT SESSION:  assess response to treatment and repeat if helpful, Core strength, promote neutral movement and weightbearing, hip  strength  KSigurd Sos PT 07/19/22 9:28 AM  Phone: [phone]: [phone]

## 2022-07-24 ENCOUNTER — Ambulatory Visit: Payer: Medicare Other | Attending: Neurosurgery

## 2022-07-24 DIAGNOSIS — M5416 Radiculopathy, lumbar region: Secondary | ICD-10-CM | POA: Diagnosis present

## 2022-07-24 DIAGNOSIS — M6281 Muscle weakness (generalized): Secondary | ICD-10-CM | POA: Diagnosis present

## 2022-07-24 DIAGNOSIS — R293 Abnormal posture: Secondary | ICD-10-CM | POA: Diagnosis present

## 2022-07-24 NOTE — Therapy (Signed)
OUTPATIENT PHYSICAL THERAPY TREATMENT  Patient Name: Susan Benjamin MRN: [id number] DOB:Apr 07, 1956, 66 y.o., female Today's Date: 07/24/2022   PT End of Session - 07/24/22 0925     Visit Number 8    Date for PT Re-Evaluation 08/08/22    Authorization Type BCBS, medicare    PT Start Time 0848    PT Stop Time 0935    PT Time Calculation (min) 47 min    Activity Tolerance Patient tolerated treatment well    Behavior During Therapy WFL for tasks assessed/performed                    Past Medical History:  Diagnosis Date   Anxiety    Cancer ([place])    CIN I (cervical intraepithelial neoplasia I)    Ectopic pregnancy    X 2   Endometrial polyp    PONV (postoperative nausea and vomiting)    Seasonal allergies    Past Surgical History:  Procedure Laterality Date   BREAST LUMPECTOMY WITH RADIOACTIVE SEED AND SENTINEL LYMPH NODE BIOPSY Right 10/06/2021   Procedure: RIGHT BREAST LUMPECTOMY WITH RADIOACTIVE SEED AND SENTINEL LYMPH NODE BIOPSY;  Surgeon: Donnie Mesa, MD;  Location: [place];  Service: General;  Laterality: Right;   CARPAL TUNNEL RELEASE     CERVICAL BIOPSY  W/ LOOP ELECTRODE EXCISION  2003   DILATION AND CURETTAGE OF UTERUS  2011   ECTOPIC PREGNANCY SURGERY     X 2-Right and Left salpingectomy   FOOT SURGERY     HYSTEROSCOPY  2011   endometrial polyp   PORTACATH PLACEMENT Right 10/06/2021   Procedure: INSERTION PORT-A-CATH;  Surgeon: Donnie Mesa, MD;  Location: [place] [facility];  Service: General;  Laterality: Right;   TUBAL LIGATION     tubal lig and tubal reversal   VAGINAL HYSTERECTOMY  2012   Patient Active Problem List   Diagnosis Date Noted   Muscle cramps 01/09/2022   Normocytic normochromic anemia 01/09/2022   Dysgeusia 12/19/2021   Port-A-Cath in place 12/18/2021   Genetic testing 10/09/2021   Malignant neoplasm of upper-outer quadrant of right breast in female, estrogen receptor positive ([place]) 09/25/2021    Benign colon polyp 10/10/2012    PCP: [name] MD  REFERRING PROVIDER: Meade Maw MD  REFERRING DIAG: M54.16  Lumbar radiculopathy  Rationale for Evaluation and Treatment Rehabilitation  THERAPY DIAG:  Lumbar radiculopathy ONSET DATE: December 27, 2021  SUBJECTIVE:  SUBJECTIVE STATEMENT: The lower leg on the left is better.  I had some aching in my Lt gluteals  PERTINENT HISTORY:   Patient was diagnosed on 09/19/2021 with right 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 She underwent a right lumpectomy and sentinel node biopsy (3 negative nodes) on 10/06/2021  Are you having pain? Yes NPRS scale: 3/10  Pain location: Lt gluteal Aggravating factors: it is constant, not impacted by position Relieving factors: Advil, gabapentin, naproxen; flex/rotation , prop LE on pillow ; cold pack  PRECAUTIONS: Other: right UE lymphedema risk  WEIGHT BEARING RESTRICTIONS No  FALLS:  Has patient fallen in last 6 months? No  LIVING ENVIRONMENT: Lives with: spouse Lives in: House/apartment Stairs: Yes: External: 3 steps; on right going up OCCUPATION: retired   PLOF: Independent  PATIENT GOALS pain to stop; I love the outdoors, hiking   OBJECTIVE:   DIAGNOSTIC FINDINGS:  X-ray "pinched nerve"   MRI:  IMPRESSION: 1. Lumbar spine degeneration especially affecting the facets with L3-4 and L4-5 anterolisthesis. 2. L3-4 advanced spinal stenosis. 3. L4-5 bilateral subarticular recess stenosis that could affect either L5 nerve root.  PATIENT SURVEYS:  FOTO 53%  COGNITION:  Overall cognitive status: Within functional limits for tasks assessed     MUSCLE LENGTH: Marcello Moores test: Right 15 deg; Left 0 deg  POSTURE: decreased lumbar lordosis  LUMBAR ROM:  Increased back pain with prone  lying;  increased pain with DKTC  Active  A/PROM  eval  Flexion 50 pain  Extension 15  Right lateral flexion 35  Left lateral flexion 30  Right rotation   Left rotation    (Blank rows = not tested)  LOWER EXTREMITY ROM:   decreased left hip internal rotation 0 degrees and painful; decreased right hip external rotation 25 degrees  TRUNK STRENGTH:  Decreased activation of transverse abdominus muscles; abdominals 4-/5; decreased activation of lumbar multifidi; trunk extensors 4-/5 LOWER EXTREMITY MMT:    MMT Right eval Left eval  Hip flexion 5 4  Hip extension 5 4  Hip abduction 4+ 3+  Hip adduction    Hip internal rotation    Hip external rotation 5 3+  Knee flexion    Knee extension 5 4  Ankle dorsiflexion 5 5  Ankle plantarflexion 5 5  Ankle inversion 5 5  Ankle eversion 5 5   (Blank rows = not tested)  LUMBAR SPECIAL TESTS:  Slump test: Positive and Single leg stance test: Positive Pain relief with left long leg hip distraction and inferior hip mobilization Lateral trunk lean and pelvic drop with attempted left SLS 3 sec  FUNCTIONAL TESTS:  Able to rise sit to stand without UE use GAIT: Comments: decreased left hip extension  TODAY'S TREATMENT  Date: 07/24/22 Diagonal knee to chest 2x20 seconds  Supine figure 4 2x20 seconds  Trigger Point Dry-Needling  Treatment instructions: Expect mild to moderate muscle soreness. S/S of pneumothorax if dry needled over a lung field, and to seek immediate medical attention should they occur. Patient verbalized understanding of these instructions and education.  Patient Consent Given: Yes Education handout provided: Previously provided Muscles treated: bil lumbar spine and Lt gluteals Treatment response/outcome: Utilized skilled palpation to identify trigger points.  During dry needling able to palpate muscle twitch and muscle elongation  Elongation to Lt gluteals after DN Skilled palpation and monitoring by PT during dry  needling  Lumbar mechanical traction: 90#/45# 60 second hold/10 seconds rest Date: 07/19/22 Trigger Point Dry-Needling  Treatment instructions: Expect mild to moderate  muscle soreness. S/S of pneumothorax if dry needled over a lung field, and to seek immediate medical attention should they occur. Patient verbalized understanding of these instructions and education.  Patient Consent Given: Yes Education handout provided: Previously provided Muscles treated: bil lumbar spine and Bil gluteals Treatment response/outcome: Utilized skilled palpation to identify trigger points.  During dry needling able to palpate muscle twitch and muscle elongation  Elongation to Lt TA after DN Skilled palpation and monitoring by PT during dry needling  Lumbar mechanical traction: 90#/45# 60 second hold/10 seconds rest Date: 07/12/22 Trigger Point Dry-Needling  Treatment instructions: Expect mild to moderate muscle soreness. S/S of pneumothorax if dry needled over a lung field, and to seek immediate medical attention should they occur. Patient verbalized understanding of these instructions and education.  Patient Consent Given: Yes Education handout provided: Previously provided Muscles treated: Lt tibialis anterior  Treatment response/outcome: Utilized skilled palpation to identify trigger points.  During dry needling able to palpate muscle twitch and muscle elongation  Elongation to Lt TA after DN Skilled palpation and monitoring by PT during dry needling  Lumbar mechanical traction: 90#/45# 60 second hold/10 seconds rest   PATIENT EDUCATION:  Access Code: [facility] URL: [email or web address] Date: 07/24/2022 Prepared by: Claiborne Billings  Exercises - Seated Hamstring Stretch  - 3 x daily - 7 x weekly - 1 sets - 3 reps - 20 hold - Seated Figure 4 Piriformis Stretch  - 3 x daily - 7 x weekly - 1 sets - 3 reps - 20 hold - Supine Lower Trunk Rotation  - 3 x daily - 7 x weekly - 1 sets - 3 reps - 20  hold - Supine Single Knee to Chest  - 3 x daily - 7 x weekly - 1 sets - 3 reps - 20 hold - Clamshell  - 2 x daily - 7 x weekly - 2 sets - 10 reps - Seated Sciatic Tensioner  - 2 x daily - 7 x weekly - 1 sets - 10 reps - Supine Hip Adduction Isometric with Ball  - 2 x daily - 7 x weekly - 2 sets - 10 reps - 5 hold - Supine Transversus Abdominis Bracing - Hands on Stomach  - 1 x daily - 7 x weekly - 3 sets - 10 reps - Supine Piriformis Stretch with Leg Straight  - 3 x daily - 7 x weekly - 1 sets - 3 reps - 20 hold - Supine Figure 4 Piriformis Stretch  - 1 x daily - 7 x weekly - 1 sets - 3 reps - 20 hold  HOME EXERCISE PROGRAM: Access Code: [email or web address]  ASSESSMENT:  CLINICAL IMPRESSION: Pt reports 60% reduction in LBP and Lt LE pain since the start of care.  Pt reports 3/10 pain in gluteals and no longer with pain in the lower leg.  Pt is independent and compliant in HEP for strength and flexibility.  Pt with tension in Lt>Lt lumbar muscles and gluteals with twitch response to DN and improved tissue mobility today.  Pt continues to benefit from lumbar mechanical traction.  Patient will benefit from skilled PT to address the below impairments and improve overall function.    OBJECTIVE IMPAIRMENTS decreased activity tolerance, difficulty walking, decreased ROM, decreased strength, impaired flexibility, and pain.   ACTIVITY LIMITATIONS lifting, bending, sitting, standing, sleeping, stairs, hygiene/grooming, and locomotion level  PARTICIPATION LIMITATIONS: meal prep, cleaning, laundry, shopping, community activity, and yard work  PERSONAL FACTORS Time since onset of injury/illness/exacerbation and 1 comorbidity: breast  cancer  are also affecting patient's functional outcome.   REHAB POTENTIAL: Good  CLINICAL DECISION MAKING: Stable/uncomplicated  EVALUATION COMPLEXITY: Low   GOALS: Goals reviewed with patient? Yes  SHORT TERM GOALS: Target date: 07/11/2022  The patient will demonstrate  knowledge of basic self care strategies and exercises to promote healing   Baseline: body mechanics education today (06/20/22) Goal status: MET  2.  The patient will report a 40% improvement in pain levels with functional activities  including feeding the animals in the mornings, sitting, walking Baseline: able to walk at [employer] (06/26/22) Goal status: MET  3.  The patient will have improved trunk flexor and extensor muscle strength to at least 4/5 needed for lifting medium weight objects such as grocery bags Baseline:  Goal status: INITIAL  4.  The patient will have improved hip strength to at least 4/5 needed for standing, walking longer distances and descending stairs at home and in the community  Baseline:  Goal status: INITIAL     LONG TERM GOALS: Target date: 08/08/2022  The patient will be independent in a safe self progression of a home exercise program to promote further recovery of function   Baseline:  Goal status: INITIAL  2.  The patient will report a 75% improvement in pain levels with functional activities which are currently difficult including feeding the animals in the morning,  Baseline: 60% (07/24/22) Goal status: in progress   3.  The patient will have improved trunk flexor and extensor muscle strength to at least 4+/5 needed for lifting medium weight objects such as laundry and luggage  Baseline:  Goal status: INITIAL  4.  The patient will have improved hip strength to at least 4+/5 needed for standing, walking longer distances and return to hiking Baseline:  Goal status: INITIAL  5.  The patient will have improved FOTO score to  64%  indicating improved function with less pain  Baseline:  Goal status: INITIAL  6.  The patient will be able to ambulate 1/2 mile with pain 3/10  MET  PLAN: PT FREQUENCY: 1-2x/week  PT DURATION: 8 weeks  PLANNED INTERVENTIONS: Therapeutic exercises, Therapeutic activity, Neuromuscular re-education, Gait  training, Patient/Family education, Self Care, Joint mobilization, Aquatic Therapy, Dry Needling, Spinal manipulation, Spinal mobilization, Cryotherapy, Moist heat, Taping, Traction, Ultrasound, Ionotophoresis 39m/ml Dexamethasone, Manual therapy, and Re-evaluation.  PLAN FOR NEXT SESSION:  assess response to treatment and repeat if helpful, Core strength, promote neutral movement and weightbearing, hip strength.  Give FIvor Reining PT 07/24/22 9:30 AM  Phone: [phone]: [phone]

## 2022-07-30 ENCOUNTER — Other Ambulatory Visit: Payer: Self-pay

## 2022-07-30 MED ORDER — GABAPENTIN 300 MG PO CAPS: 300.0000 mg | ORAL_CAPSULE | Freq: Three times a day (TID) | ORAL | 1 refills | Status: DC

## 2022-07-31 ENCOUNTER — Ambulatory Visit: Payer: Medicare Other

## 2022-07-31 DIAGNOSIS — R293 Abnormal posture: Secondary | ICD-10-CM

## 2022-07-31 DIAGNOSIS — M6281 Muscle weakness (generalized): Secondary | ICD-10-CM

## 2022-07-31 DIAGNOSIS — M5416 Radiculopathy, lumbar region: Secondary | ICD-10-CM

## 2022-07-31 NOTE — Therapy (Addendum)
OUTPATIENT PHYSICAL THERAPY TREATMENT  Patient Name: Susan Benjamin MRN: [id number] DOB:Sep 10, 1955, 66 y.o., female Today's Date: 07/31/2022   PT End of Session - 07/31/22 0936     Visit Number 9    Authorization Type BCBS, medicare    PT Start Time [phone]    PT Stop Time 0933    PT Time Calculation (min) 35 min    Activity Tolerance Patient tolerated treatment well    Behavior During Therapy WFL for tasks assessed/performed                     Past Medical History:  Diagnosis Date   Anxiety    Cancer ([place])    CIN I (cervical intraepithelial neoplasia I)    Ectopic pregnancy    X 2   Endometrial polyp    PONV (postoperative nausea and vomiting)    Seasonal allergies    Past Surgical History:  Procedure Laterality Date   BREAST LUMPECTOMY WITH RADIOACTIVE SEED AND SENTINEL LYMPH NODE BIOPSY Right 10/06/2021   Procedure: RIGHT BREAST LUMPECTOMY WITH RADIOACTIVE SEED AND SENTINEL LYMPH NODE BIOPSY;  Surgeon: Donnie Mesa, MD;  Location: [facility];  Service: General;  Laterality: Right;   CARPAL TUNNEL RELEASE     CERVICAL BIOPSY  W/ LOOP ELECTRODE EXCISION  2003   DILATION AND CURETTAGE OF UTERUS  2011   ECTOPIC PREGNANCY SURGERY     X 2-Right and Left salpingectomy   FOOT SURGERY     HYSTEROSCOPY  2011   endometrial polyp   PORTACATH PLACEMENT Right 10/06/2021   Procedure: INSERTION PORT-A-CATH;  Surgeon: Donnie Mesa, MD;  Location: [facility];  Service: General;  Laterality: Right;   TUBAL LIGATION     tubal lig and tubal reversal   VAGINAL HYSTERECTOMY  2012   Patient Active Problem List   Diagnosis Date Noted   Muscle cramps 01/09/2022   Normocytic normochromic anemia 01/09/2022   Dysgeusia 12/19/2021   Port-A-Cath in place 12/18/2021   Genetic testing 10/09/2021   Malignant neoplasm of upper-outer quadrant of right breast in female, estrogen receptor positive ([place]) 09/25/2021   Benign colon polyp 10/10/2012     PCP: [name] MD  REFERRING PROVIDER: Meade Maw MD  REFERRING DIAG: M54.16  Lumbar radiculopathy  Rationale for Evaluation and Treatment Rehabilitation  THERAPY DIAG:  Lumbar radiculopathy ONSET DATE: December 27, 2021  SUBJECTIVE:                                                                                                                                                                                           SUBJECTIVE STATEMENT:  Leg pain is better, low back has been hurting due to decorating for Christmas at my house and in-laws.  70% overall improvement in symptoms since the start of care.   PERTINENT HISTORY:   Patient was diagnosed on 09/19/2021 with right 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 She underwent a right lumpectomy and sentinel node biopsy (3 negative nodes) on 10/06/2021  Are you having pain? Yes NPRS scale: 4-5/10  Pain location: low back Aggravating factors: it is constant, not impacted by position Relieving factors: Advil, gabapentin, naproxen; flex/rotation , prop LE on pillow ; cold pack  PRECAUTIONS: Other: right UE lymphedema risk  WEIGHT BEARING RESTRICTIONS No  FALLS:  Has patient fallen in last 6 months? No  LIVING ENVIRONMENT: Lives with: spouse Lives in: House/apartment Stairs: Yes: External: 3 steps; on right going up OCCUPATION: retired   PLOF: Independent  PATIENT GOALS pain to stop; I love the outdoors, hiking   OBJECTIVE:  DIAGNOSTIC FINDINGS:  X-ray "pinched nerve"   MRI:  IMPRESSION: 1. Lumbar spine degeneration especially affecting the facets with L3-4 and L4-5 anterolisthesis. 2. L3-4 advanced spinal stenosis. 3. L4-5 bilateral subarticular recess stenosis that could affect either L5 nerve root.  PATIENT SURVEYS:  FOTO 53% 07/31/22: 83  COGNITION:  Overall cognitive status: Within functional limits for tasks assessed     MUSCLE LENGTH: Marcello Moores test: Right 15 deg; Left 0 deg  POSTURE:  decreased lumbar lordosis  LUMBAR ROM:  Increased back pain with prone lying;  increased pain with DKTC  Active  A/PROM  eval  Flexion 50 pain  Extension 15  Right lateral flexion 35  Left lateral flexion 30  Right rotation   Left rotation    (Blank rows = not tested)  LOWER EXTREMITY ROM:   decreased left hip internal rotation 0 degrees and painful; decreased right hip external rotation 25 degrees  TRUNK STRENGTH:  Decreased activation of transverse abdominus muscles; abdominals 4-/5; decreased activation of lumbar multifidi; trunk extensors 4-/5 LOWER EXTREMITY MMT:    MMT Right eval Left eval Lt  07/31/22  Hip flexion 5 4 4+  Hip extension 5 4 4+  Hip abduction 4+ 3+ 4+  Hip adduction     Hip internal rotation     Hip external rotation 5 3+   Knee flexion     Knee extension 5 4   Ankle dorsiflexion 5 5   Ankle plantarflexion 5 5   Ankle inversion 5 5   Ankle eversion 5 5    (Blank rows = not tested)  LUMBAR SPECIAL TESTS:  Slump test: Positive and Single leg stance test: Positive Pain relief with left long leg hip distraction and inferior hip mobilization Lateral trunk lean and pelvic drop with attempted left SLS 3 sec  FUNCTIONAL TESTS:  Able to rise sit to stand without UE use GAIT: Comments: decreased left hip extension  TODAY'S TREATMENT  Date: 07/31/22 NuStep: level 2x 6 minutes-PT present to discuss Seated hamstring stretch 3x20 seconds  Trigger Point Dry-Needling  Treatment instructions: Expect mild to moderate muscle soreness. S/S of pneumothorax if dry needled over a lung field, and to seek immediate medical attention should they occur. Patient verbalized understanding of these instructions and education.  Patient Consent Given: Yes Education handout provided: Previously provided Muscles treated: bil lumbar spine and Lt and Rt gluteals Treatment response/outcome: Utilized skilled palpation to identify trigger points.  During dry needling able to  palpate muscle twitch and muscle elongation  Elongation to Lt gluteals after DN  Skilled palpation and monitoring by PT during dry needling   Date: 07/24/22 Diagonal knee to chest 2x20 seconds  Supine figure 4 2x20 seconds  Trigger Point Dry-Needling  Treatment instructions: Expect mild to moderate muscle soreness. S/S of pneumothorax if dry needled over a lung field, and to seek immediate medical attention should they occur. Patient verbalized understanding of these instructions and education.  Patient Consent Given: Yes Education handout provided: Previously provided Muscles treated: bil lumbar spine and Lt gluteals Treatment response/outcome: Utilized skilled palpation to identify trigger points.  During dry needling able to palpate muscle twitch and muscle elongation  Elongation to Lt gluteals after DN Skilled palpation and monitoring by PT during dry needling  Lumbar mechanical traction: 90#/45# 60 second hold/10 seconds rest Date: 07/19/22 Trigger Point Dry-Needling  Treatment instructions: Expect mild to moderate muscle soreness. S/S of pneumothorax if dry needled over a lung field, and to seek immediate medical attention should they occur. Patient verbalized understanding of these instructions and education.  Patient Consent Given: Yes Education handout provided: Previously provided Muscles treated: bil lumbar spine and Bil gluteals Treatment response/outcome: Utilized skilled palpation to identify trigger points.  During dry needling able to palpate muscle twitch and muscle elongation  Elongation to Lt TA after DN Skilled palpation and monitoring by PT during dry needling  Lumbar mechanical traction: 90#/45# 60 second hold/10 seconds rest PATIENT EDUCATION:  Access Code: [facility] URL: [email or web address] Date: 07/24/2022 Prepared by: Claiborne Billings  Exercises - Seated Hamstring Stretch  - 3 x daily - 7 x weekly - 1 sets - 3 reps - 20 hold - Seated Figure 4  Piriformis Stretch  - 3 x daily - 7 x weekly - 1 sets - 3 reps - 20 hold - Supine Lower Trunk Rotation  - 3 x daily - 7 x weekly - 1 sets - 3 reps - 20 hold - Supine Single Knee to Chest  - 3 x daily - 7 x weekly - 1 sets - 3 reps - 20 hold - Clamshell  - 2 x daily - 7 x weekly - 2 sets - 10 reps - Seated Sciatic Tensioner  - 2 x daily - 7 x weekly - 1 sets - 10 reps - Supine Hip Adduction Isometric with Ball  - 2 x daily - 7 x weekly - 2 sets - 10 reps - 5 hold - Supine Transversus Abdominis Bracing - Hands on Stomach  - 1 x daily - 7 x weekly - 3 sets - 10 reps - Supine Piriformis Stretch with Leg Straight  - 3 x daily - 7 x weekly - 1 sets - 3 reps - 20 hold - Supine Figure 4 Piriformis Stretch  - 1 x daily - 7 x weekly - 1 sets - 3 reps - 20 hold  HOME EXERCISE PROGRAM: Access Code: [email or web address]  ASSESSMENT:  CLINICAL IMPRESSION: Pt reports 70% reduction in LBP and Lt LE pain since the start of care.  Pt reports 4/10 pain in low back and is no longer experiencing Lt LE pain.   Pt is independent and compliant in HEP for strength and flexibility.  Pt with tension in Lt>Lt lumbar muscles and gluteals with twitch response to DN and improved tissue mobility today.  Pt will continue to perform her HEP and implement body mechanics modifications. Pt will see MD  this week with probable D/C to HEP   OBJECTIVE IMPAIRMENTS decreased activity tolerance, difficulty walking, decreased ROM, decreased strength, impaired flexibility, and pain.   ACTIVITY  LIMITATIONS lifting, bending, sitting, standing, sleeping, stairs, hygiene/grooming, and locomotion level  PARTICIPATION LIMITATIONS: meal prep, cleaning, laundry, shopping, community activity, and yard work  PERSONAL FACTORS Time since onset of injury/illness/exacerbation and 1 comorbidity: breast cancer  are also affecting patient's functional outcome.   REHAB POTENTIAL: Good  CLINICAL DECISION MAKING: Stable/uncomplicated  EVALUATION COMPLEXITY:  Low   GOALS: Goals reviewed with patient? Yes  SHORT TERM GOALS: Target date: 07/11/2022  The patient will demonstrate knowledge of basic self care strategies and exercises to promote healing   Baseline: body mechanics education today (06/20/22) Goal status: MET  2.  The patient will report a 40% improvement in pain levels with functional activities  including feeding the animals in the mornings, sitting, walking Baseline: able to walk at [employer] (06/26/22) Goal status: MET  3.  The patient will have improved trunk flexor and extensor muscle strength to at least 4/5 needed for lifting medium weight objects such as grocery bags Baseline:  Goal status: INITIAL  4.  The patient will have improved hip strength to at least 4/5 needed for standing, walking longer distances and descending stairs at home and in the community  Baseline:  Goal status: INITIAL     LONG TERM GOALS: Target date: 08/08/2022  The patient will be independent in a safe self progression of a home exercise program to promote further recovery of function   Baseline:  Goal status: MET  2.  The patient will report a 75% improvement in pain levels with functional activities which are currently difficult including feeding the animals in the morning,  Baseline: 70% (07/31/22) Goal status: Partially met  3.  The patient will have improved trunk flexor and extensor muscle strength to at least 4+/5 needed for lifting medium weight objects such as laundry and luggage  Baseline:  Goal status: MET  4.  The patient will have improved hip strength to at least 4+/5 needed for standing, walking longer distances and return to hiking Baseline: met Goal status: met  5.  The patient will have improved FOTO score to  64%  indicating improved function with less pain  Baseline: 67 (07/31/22) Goal status: MET  6.  The patient will be able to ambulate 1/2 mile with pain 3/10  MET  PLAN: PT FREQUENCY: 1-2x/week  PT  DURATION: 8 weeks  PLANNED INTERVENTIONS: Therapeutic exercises, Therapeutic activity, Neuromuscular re-education, Gait training, Patient/Family education, Self Care, Joint mobilization, Aquatic Therapy, Dry Needling, Spinal manipulation, Spinal mobilization, Cryotherapy, Moist heat, Taping, Traction, Ultrasound, Ionotophoresis 26m/ml Dexamethasone, Manual therapy, and Re-evaluation.  PLAN FOR NEXT SESSION:  Pt sees MD this week.  Probable D/C to [facility] 07/31/22 9:37 AM  Phone: [phone]: [phone]  PHYSICAL THERAPY DISCHARGE SUMMARY  Visits from Start of Care: 9  Current functional level related to goals / functional outcomes: See above for most current status.  Pt is doing well and will continue with HEP.    Remaining deficits: No significant deficits at this time.     Education / Equipment: HEP, [employer]  Patient agrees to discharge. Patient goals were met. Patient is being discharged due to meeting the stated rehab goals.   KS[facility] Sos PT 08/22/22 12:17 PM

## 2022-08-01 ENCOUNTER — Other Ambulatory Visit: Payer: Self-pay

## 2022-08-01 DIAGNOSIS — M5416 Radiculopathy, lumbar region: Secondary | ICD-10-CM

## 2022-08-02 ENCOUNTER — Ambulatory Visit (INDEPENDENT_AMBULATORY_CARE_PROVIDER_SITE_OTHER): Payer: Medicare Other | Admitting: Neurosurgery

## 2022-08-02 ENCOUNTER — Encounter: Payer: Self-pay | Admitting: Neurosurgery

## 2022-08-02 ENCOUNTER — Ambulatory Visit
Admission: RE | Admit: 2022-08-02 | Discharge: 2022-08-02 | Disposition: A | Discharge status: Home / Self Care | Payer: Medicare Other | Attending: Neurosurgery | Admitting: Neurosurgery

## 2022-08-02 ENCOUNTER — Ambulatory Visit
Admission: RE | Admit: 2022-08-02 | Discharge: 2022-08-02 | Disposition: A | Discharge status: Home / Self Care | Payer: Medicare Other | Source: Ambulatory Visit | Attending: Neurosurgery | Admitting: Neurosurgery

## 2022-08-02 VITALS — BP 132/83 | HR 79 | Ht 64.0 in | Wt 170.8 lb

## 2022-08-02 DIAGNOSIS — M5416 Radiculopathy, lumbar region: Secondary | ICD-10-CM | POA: Insufficient documentation

## 2022-08-02 DIAGNOSIS — M4310 Spondylolisthesis, site unspecified: Secondary | ICD-10-CM

## 2022-08-02 DIAGNOSIS — M48062 Spinal stenosis, lumbar region with neurogenic claudication: Secondary | ICD-10-CM

## 2022-08-02 MED ORDER — METHOCARBAMOL 500 MG PO TABS: 500.0000 mg | ORAL_TABLET | Freq: Four times a day (QID) | ORAL | 0 refills | Status: DC | PRN

## 2022-08-02 MED ORDER — GABAPENTIN 300 MG PO CAPS: 300.0000 mg | ORAL_CAPSULE | Freq: Three times a day (TID) | ORAL | 1 refills | Status: DC

## 2022-08-02 MED ORDER — MELOXICAM 15 MG PO TABS: 15.0000 mg | ORAL_TABLET | Freq: Every day | ORAL | 0 refills | Status: AC

## 2022-08-02 NOTE — Progress Notes (Signed)
Referring Physician:  [name], MD [address] [place],  [place] [address]  Primary Physician:  [name], MD  History of Present Illness: 08/02/2022 Ms. Eddinger returns to see me.  Since her last visit, she has been doing physical therapy which she started on October 11.  Also had an injection recently.  She is currently doing much better.  She would like to continue doing therapy and exercises for now.  06/05/2022 Ms. Graceanne Guin is here today with a chief complaint of low back and left leg pain.  She has pain down the back of her leg and the back of her calf to her ankle.  This has been going on intermittently, but consistently over the last 6 months.  She has been undergoing chemotherapy for breast cancer, so had not started treatment of this.  Standing makes her pain better, while sitting or walking significant distances make it worse.  She denies weakness  bowel/Bladder Dysfunction: none  Conservative measures:  Physical therapy:  has not participated  Multimodal medical therapy including regular antiinflammatories:  baclofen, ibuprofen, tylenol, aleve, naproxen Injections:  has not received epidural steroid injections  Past Surgery: denies  JANNAE FAGERSTROM has no symptoms of cervical myelopathy.  The symptoms are causing a significant impact on the patient's life.   Review of Systems:  A 10 point review of systems is negative, except for the pertinent positives and negatives detailed in the HPI.  Past Medical History: Past Medical History:  Diagnosis Date   Anxiety    Cancer ([place])    CIN I (cervical intraepithelial neoplasia I)    Ectopic pregnancy    X 2   Endometrial polyp    PONV (postoperative nausea and vomiting)    Seasonal allergies     Past Surgical History: Past Surgical History:  Procedure Laterality Date   BREAST LUMPECTOMY WITH RADIOACTIVE SEED AND SENTINEL LYMPH NODE BIOPSY Right 10/06/2021   Procedure: RIGHT BREAST  LUMPECTOMY WITH RADIOACTIVE SEED AND SENTINEL LYMPH NODE BIOPSY;  Surgeon: Donnie Mesa, MD;  Location: [facility];  Service: General;  Laterality: Right;   CARPAL TUNNEL RELEASE     CERVICAL BIOPSY  W/ LOOP ELECTRODE EXCISION  2003   DILATION AND CURETTAGE OF UTERUS  2011   ECTOPIC PREGNANCY SURGERY     X 2-Right and Left salpingectomy   FOOT SURGERY     HYSTEROSCOPY  2011   endometrial polyp   PORTACATH PLACEMENT Right 10/06/2021   Procedure: INSERTION PORT-A-CATH;  Surgeon: Donnie Mesa, MD;  Location: [facility];  Service: General;  Laterality: Right;   TUBAL LIGATION     tubal lig and tubal reversal   VAGINAL HYSTERECTOMY  2012    Allergies: Allergies as of 08/02/2022   (No Known Allergies)    Medications: No outpatient medications have been marked as taking for the 08/02/22 encounter (Office Visit) with Meade Maw, MD.    Social History: Social History   Tobacco Use   Smoking status: Never   Smokeless tobacco: Never  Vaping Use   Vaping Use: Never used  Substance Use Topics   Alcohol use: Not Currently    Comment: rarely   Drug use: No    Family Medical History: Family History  Problem Relation Age of Onset   Diabetes Mother    Hypertension Mother    Stroke Mother    Heart disease Mother    Hypertension Father    Melanoma Father 63  metastasized   Breast cancer Cousin        dx. 40s    Physical Examination: There were no vitals filed for this visit.  General: Patient is well developed, well nourished, calm, collected, and in no apparent distress. Attention to examination is appropriate.  Neck:   Supple.  Full range of motion.  Respiratory: Patient is breathing without any difficulty.   NEUROLOGICAL:     Awake, alert, oriented to person, place, and time.  Speech is clear and fluent. Fund of knowledge is appropriate.   Cranial Nerves: Pupils equal round and reactive to light.  Facial tone is symmetric.   Facial sensation is symmetric. Shoulder shrug is symmetric. Tongue protrusion is midline.  There is no pronator drift.  ROM of spine: full.    Strength: Side Biceps Triceps Deltoid Interossei Grip Wrist Ext. Wrist Flex.  R '5 5 5 5 5 5 5  '$ L '5 5 5 5 5 5 5   '$ Side Iliopsoas Quads Hamstring PF DF EHL  R '5 5 5 5 5 5  '$ L '5 5 5 5 5 5   '$ Reflexes are 2+ and symmetric at the biceps, triceps, brachioradialis, patella and achilles.   Hoffman's is absent.   Bilateral upper and lower extremity sensation is intact to light touch.    No evidence of dysmetria noted.  Gait is antalgic.     Medical Decision Making  Imaging: L spine xrays 03/07/22 FINDINGS: Lumbar vertebral body height are preserved without evidence of fracture. Grade 1 anterolisthesis of L3 on L4 and minimally L4 on L5. No focal bone lesions identified. No spondylolysis visualized. Facet arthropathy most significant from L3-S1. Mild intervertebral disc space narrowing from L3-S1.   IMPRESSION: Degenerative changes as described. No focal bone lesions identified.     Electronically Signed   By: Ofilia Neas M.D.   On: 03/07/2022 13:13  MRI L spine 06/13/2022 L2-L3: Mild facet spurring   L3-L4: Facet osteoarthritis with bulky spurring and anterolisthesis. Ligamentum flavum thickening. Disc space narrowing and bulging. Severe spinal stenosis   L4-L5: Facet osteoarthritis with spurring and anterolisthesis. Disc bulging circumferentially. Bilateral subarticular recess stenosis that could affect either L5 nerve root.   L5-S1:Mild facet spurring.   IMPRESSION: 1. Lumbar spine degeneration especially affecting the facets with L3-4 and L4-5 anterolisthesis. 2. L3-4 advanced spinal stenosis. 3. L4-5 bilateral subarticular recess stenosis that could affect either L5 nerve root.     Electronically Signed   By: Jorje Guild M.D.   On: 06/14/2022 07:01  Flexion-extension x-rays from August 02, 2022 show L3-4  anterolisthesis that increases from 5 mm to 9 mm from extension to flexion respectively.  I have personally reviewed the images and agree with the above interpretation.  Assessment and Plan: Ms. Meth is a pleasant 66 y.o. female with lumbar radiculopathy and neurogenic claudication.  She has anterolisthesis of L3 on L4.  She has had substantial improvement in her symptoms with physical therapy and medications.  Will continue conservative management for now with physical therapy, exercises, medications.  I will see her back in 3 to 4 months to reevaluate her symptoms.  I refilled her medications.   I spent a total of 10 minutes in face-to-face and non-face-to-face activities related to this patient's care today.  Thank you for involving me in the care of this patient.      Karlin Binion K. Izora Ribas MD, [facility] Neurosurgery

## 2022-08-06 ENCOUNTER — Inpatient Hospital Stay: Payer: BC Managed Care – PPO | Attending: Adult Health | Admitting: Adult Health

## 2022-08-06 ENCOUNTER — Other Ambulatory Visit: Payer: Self-pay

## 2022-08-06 ENCOUNTER — Encounter: Payer: Self-pay | Admitting: Adult Health

## 2022-08-06 VITALS — BP 126/72 | HR 85 | Temp 98.5°F | Resp 18 | Ht 64.0 in | Wt 171.6 lb

## 2022-08-06 DIAGNOSIS — Z170 Estrogen receptor positive status [ER+]: Secondary | ICD-10-CM | POA: Diagnosis not present

## 2022-08-06 DIAGNOSIS — Z803 Family history of malignant neoplasm of breast: Secondary | ICD-10-CM | POA: Diagnosis not present

## 2022-08-06 DIAGNOSIS — Z79811 Long term (current) use of aromatase inhibitors: Secondary | ICD-10-CM | POA: Diagnosis not present

## 2022-08-06 DIAGNOSIS — C50411 Malignant neoplasm of upper-outer quadrant of right female breast: Secondary | ICD-10-CM | POA: Diagnosis present

## 2022-08-06 DIAGNOSIS — Z808 Family history of malignant neoplasm of other organs or systems: Secondary | ICD-10-CM | POA: Insufficient documentation

## 2022-08-06 NOTE — Progress Notes (Signed)
SURVIVORSHIP VISIT:   BRIEF ONCOLOGIC HISTORY:  Oncology History  Malignant neoplasm of upper-outer quadrant of right breast in female, estrogen receptor positive ([place])  08/08/2021 Imaging   August 08, 2021, patient had bilateral screening mammogram which showed 2 possible masses in the right breast which warrant further evaluation.  No concerning findings in the left breast. She had diagnostic mammogram on 09/13/2021 which noted possible mass in the upper central breast at middle depth which persists on additional views is 1.9 cm oval mass with indistinct margins.  Second previously noted possible mass in the upper outer breast at anterior to middle depth dissipates on additional views consistent with overlapping fibroglandular tissue. Ultrasound showed suspicious 1.9 cm mass at the right breast 12 o'clock position for which ultrasound-guided biopsy was recommended no right axillary lymphadenopathy.   09/25/2021 Initial Diagnosis   Malignant neoplasm of upper-outer quadrant of right breast in female, estrogen receptor positive ([place])    Genetic Testing   [facility] CancerNext-Expanded Panel was Negative. 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 Report date is 10/20/2021.  MSH6 VUS (p.N112S) was reclassified to likely benign. Report date is 11/21/2021.  The CancerNext-Expanded gene panel offered by [facility] and includes sequencing, rearrangement, and RNA analysis for the following 77 genes: AIP, ALK, APC, ATM, AXIN2, BAP1, BARD1, BLM, BMPR1A, BRCA1, BRCA2, BRIP1, CDC73, CDH1, CDK4, CDKN1B, CDKN2A, CHEK2, CTNNA1, DICER1, FANCC, FH, FLCN, GALNT12, KIF1B, LZTR1, MAX, MEN1, MET, MLH1, MSH2, MSH3, MSH6, MUTYH, NBN, NF1, NF2, NTHL1, PALB2, PHOX2B, PMS2, POT1, PRKAR1A, PTCH1, PTEN, RAD51C, RAD51D, RB1, RECQL, RET, SDHA, SDHAF2, SDHB, SDHC, SDHD, SMAD4, SMARCA4, SMARCB1, SMARCE1, STK11, SUFU, TMEM127, TP53, TSC1, TSC2, VHL and XRCC2 (sequencing and  deletion/duplication); EGFR, EGLN1, HOXB13, KIT, MITF, PDGFRA, POLD1, and POLE (sequencing only); EPCAM and GREM1 (deletion/duplication only).    10/06/2021 Surgery   A. BREAST, RIGHT, LUMPECTOMY:  -  Invasive carcinoma of no special type (ductal), grade3 (total  Nottingham score 9), 25 mm in greatest dimension.  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  Prognostics from initial biopsy showed ER +30%, weak staining, PR 0%, negative, Ki-67 of 90%.  HER2 negative by IHC.     11/06/2021 - 01/11/2022 Chemotherapy   Patient is on Treatment Plan : BREAST TC q21d     11/27/2021 Cancer Staging   Staging form: Breast, AJCC 8th Edition - Pathologic: Stage IIA (pT2, pN0, cM0, G3, ER+, PR-, HER2-) - Signed by Gardenia Phlegm, NP on 11/27/2021 Histologic grading system: 3 grade system   02/13/2022 - 03/13/2022 Radiation Therapy   Site Technique Total Dose (Gy) Dose per Fx (Gy) Completed Fx Beam Energies  Breast, Right: Breast_R 3D 42.56/42.56 2.66 16/16 6XFFF  Breast, Right: Breast_R_Bst 3D 8/8 2 4/4 6X, 10X     03/2022 -  Anti-estrogen oral therapy   Anastrozole     INTERVAL HISTORY:  Susan Benjamin to review her survivorship care plan detailing her treatment course for breast cancer, as well as monitoring long-term side effects of that treatment, education regarding health maintenance, screening, and overall wellness and health promotion.     Overall, Susan Benjamin reports feeling quite well. She continues on anastrozole daily.  She says that her back pain became increasingly worse during chemotherapy, and since then she has been following with neurosurgery as she may need spine surgery.    REVIEW OF SYSTEMS:  Review of Systems  Constitutional:  Negative for appetite change, chills, fatigue, fever  and unexpected weight change.  HENT:   Negative for hearing loss, lump/mass and trouble swallowing.   Eyes:  Negative for eye problems and icterus.  Respiratory:   Negative for chest tightness, cough and shortness of breath.   Cardiovascular:  Negative for chest pain, leg swelling and palpitations.  Gastrointestinal:  Negative for abdominal distention, abdominal pain, constipation, diarrhea, nausea and vomiting.  Endocrine: Negative for hot flashes.  Genitourinary:  Negative for difficulty urinating.   Musculoskeletal:  Positive for arthralgias and back pain.  Skin:  Negative for itching and rash.  Neurological:  Negative for dizziness, extremity weakness, headaches and numbness.  Hematological:  Negative for adenopathy. Does not bruise/bleed easily.  Psychiatric/Behavioral:  Negative for depression. The patient is not nervous/anxious.   Breast: Denies any new nodularity, masses, tenderness, nipple changes, or nipple discharge.      PAST MEDICAL/SURGICAL HISTORY:  Past Medical History:  Diagnosis Date   Anxiety    Cancer (HCC)    CIN I (cervical intraepithelial neoplasia I)    Ectopic pregnancy    X 2   Endometrial polyp    PONV (postoperative nausea and vomiting)    Seasonal allergies    Past Surgical History:  Procedure Laterality Date   BREAST LUMPECTOMY WITH RADIOACTIVE SEED AND SENTINEL LYMPH NODE BIOPSY Right 10/06/2021   Procedure: RIGHT BREAST LUMPECTOMY WITH RADIOACTIVE SEED AND SENTINEL LYMPH NODE BIOPSY;  Surgeon: Tsuei, Matthew, MD;  Location: [facility];  Service: General;  Laterality: Right;   CARPAL TUNNEL RELEASE     CERVICAL BIOPSY  W/ LOOP ELECTRODE EXCISION  2003   DILATION AND CURETTAGE OF UTERUS  2011   ECTOPIC PREGNANCY SURGERY     X 2-Right and Left salpingectomy   FOOT SURGERY     HYSTEROSCOPY  2011   endometrial polyp   PORTACATH PLACEMENT Right 10/06/2021   Procedure: INSERTION PORT-A-CATH;  Surgeon: Tsuei, Matthew, MD;  Location: [facility];  Service: General;  Laterality: Right;   TUBAL LIGATION     tubal lig and tubal reversal   VAGINAL HYSTERECTOMY  2012     ALLERGIES:  No  Known Allergies   CURRENT MEDICATIONS:  Outpatient Encounter Medications as of 08/06/2022  Medication Sig   anastrozole (ARIMIDEX) 1 MG tablet Take 1 tablet (1 mg total) by mouth daily.   gabapentin (NEURONTIN) 300 MG capsule Take 1 capsule (300 mg total) by mouth 3 (three) times daily.   meloxicam (MOBIC) 15 MG tablet Take 1 tablet (15 mg total) by mouth daily.   methocarbamol (ROBAXIN) 500 MG tablet Take 1 tablet (500 mg total) by mouth every 6 (six) hours as needed for muscle spasms.   No facility-administered encounter medications on file as of 08/06/2022.     ONCOLOGIC FAMILY HISTORY:  Family History  Problem Relation Age of Onset   Diabetes Mother    Hypertension Mother    Stroke Mother    Heart disease Mother    Hypertension Father    Melanoma Father 74       metastasized   Breast cancer Cousin        dx. 40s      SOCIAL HISTORY:  Social History   Socioeconomic History   Marital status: Married    Spouse name: Not on file   Number of children: Not on file   Years of education: Not on file   Highest education level: Not on file  Occupational History   Not on file  Tobacco Use     Smoking status: Never   Smokeless tobacco: Never  Vaping Use   Vaping Use: Never used  Substance and Sexual Activity   Alcohol use: Not Currently    Comment: rarely   Drug use: No   Sexual activity: Yes    Birth control/protection: Surgical    Comment: INTERCOURSE AGE 66, SEXUAL PARTNERS LESS THAN 5  Other Topics Concern   Not on file  Social History Narrative   Not on file   Social Determinants of Health   Financial Resource Strain: Not on file  Food Insecurity: Not on file  Transportation Needs: Not on file  Physical Activity: Not on file  Stress: Not on file  Social Connections: Not on file  Intimate Partner Violence: Not on file     OBSERVATIONS/OBJECTIVE:  There were no vitals taken for this visit. GENERAL: Patient is a well appearing female in no acute  distress HEENT:  Sclerae anicteric.  Oropharynx clear and moist. No ulcerations or evidence of oropharyngeal candidiasis. Neck is supple.  NODES:  No cervical, supraclavicular, or axillary lymphadenopathy palpated.  BREAST EXAM: Right breast status postlumpectomy no sign of local recurrence left breast is benign LUNGS:  Clear to auscultation bilaterally.  No wheezes or rhonchi. HEART:  Regular rate and rhythm. No murmur appreciated. ABDOMEN:  Soft, nontender.  Positive, normoactive bowel sounds. No organomegaly palpated. MSK:  No focal spinal tenderness to palpation. Full range of motion bilaterally in the upper extremities. EXTREMITIES:  No peripheral edema.   SKIN:  Clear with no obvious rashes or skin changes. No nail dyscrasia. NEURO:  Nonfocal. Well oriented.  Appropriate affect.  LABORATORY DATA:  None for this visit.  DIAGNOSTIC IMAGING:  None for this visit.      ASSESSMENT AND PLAN:  Susan Benjamin is a pleasant 66 y.o. 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  She presents to the Survivorship Clinic for our initial meeting and routine follow-up post-completion of treatment for breast cancer.    1. Stage 2A right breast cancer:  Susan Benjamin is continuing to recover from definitive treatment for breast cancer. She will follow-up with her medical oncologist, Dr. Chryl Heck with history and physical exam per surveillance protocol.  Susan Benjamin is having increased back pain and arthralgias and she is unsure if it is related to the anastrozole.  Neurosurgery is talking about potentially performing surgery on her.  I recommended that she take a 2-week holiday from the anastrozole to determine if it is the etiology for this increase in pain.  We will follow-up together in 2 weeks for a visit that can  be either in person or virtual.. Her mammogram is due 08/2022 as already scheduled.  Today, a comprehensive survivorship care plan and treatment summary was reviewed with the patient today detailing her breast cancer diagnosis, treatment course, potential late/long-term effects of treatment, appropriate follow-up care with recommendations for the future, and patient education resources.  A copy of this summary, along with a letter will be sent to the patient's primary care provider via mail/fax/In Basket message after today's visit.    2. Bone health:  Given Susan Benjamin's age/history of breast cancer and her current treatment regimen including anti-estrogen therapy with anastrozole, she is at risk for bone demineralization.  She is scheduled for bone density testing on August 23, 2022.  She was given education on specific activities to promote  bone health.  3. Cancer screening:  Due to Susan Benjamin's history and her age, she should receive screening for skin cancers, colon cancer, and gynecologic cancers.  The information and recommendations are listed on the patient's comprehensive care plan/treatment summary and were reviewed in detail with the patient.    4. Health maintenance and wellness promotion: Susan Benjamin was encouraged to consume 5-7 servings of fruits and vegetables per day. We reviewed the "Nutrition Rainbow" handout.  She was also encouraged to engage in moderate to vigorous exercise for 30 minutes per day most days of the week. We discussed the [employer] fitness program, which is designed for cancer survivors to help them become more physically fit after cancer treatments.  She was instructed to limit her alcohol consumption and continue to abstain from tobacco use.     5. Support services/counseling: It is not uncommon for this period of the patient's cancer care trajectory to be one of many emotions and stressors.  She was given information regarding our available services and  encouraged to contact me with any questions or for help enrolling in any of our support group/programs.    Follow up instructions:    -Return to cancer center in 2 weeks for follow-up and in February 2024 to see Dr. Iruku -Mammogram due in December 2023 -Bone density testing in December 2023 -She is welcome to return back to the Survivorship Clinic at any time; no additional follow-up needed at this time.  -Consider referral back to survivorship as a long-term survivor for continued surveillance  The patient was provided an opportunity to ask questions and all were answered. The patient agreed with the plan and demonstrated an understanding of the instructions.   Total encounter time:40 minutes*in face-to-face visit time, chart review, lab review, care coordination, order entry, and documentation of the encounter time.    Lindsey Causey, NP 08/06/22 9:26 AM Medical Oncology and Hematology [facility] [address] [place], [place] [address] Tel. [phone]    Fax. [phone]  *Total Encounter Time as defined by the Centers for Medicare and Medicaid Services includes, in addition to the face-to-face time of a patient visit (documented in the note above) non-face-to-face time: obtaining and reviewing outside history, ordering and reviewing medications, tests or procedures, care coordination (communications with other health care professionals or caregivers) and documentation in the medical record.  

## 2022-08-20 ENCOUNTER — Inpatient Hospital Stay (HOSPITAL_BASED_OUTPATIENT_CLINIC_OR_DEPARTMENT_OTHER): Payer: BC Managed Care – PPO | Admitting: Adult Health

## 2022-08-20 ENCOUNTER — Ambulatory Visit: Payer: BC Managed Care – PPO | Attending: Surgery

## 2022-08-20 VITALS — Wt 172.1 lb

## 2022-08-20 DIAGNOSIS — Z170 Estrogen receptor positive status [ER+]: Secondary | ICD-10-CM | POA: Diagnosis not present

## 2022-08-20 DIAGNOSIS — C50411 Malignant neoplasm of upper-outer quadrant of right female breast: Secondary | ICD-10-CM

## 2022-08-20 DIAGNOSIS — Z483 Aftercare following surgery for neoplasm: Secondary | ICD-10-CM | POA: Insufficient documentation

## 2022-08-20 MED ORDER — LETROZOLE 2.5 MG PO TABS: 2.5000 mg | ORAL_TABLET | Freq: Every day | ORAL | 3 refills | Status: DC

## 2022-08-20 NOTE — Progress Notes (Signed)
Susan Benjamin Follow up:    Susan Benjamin, [place] [address]   DIAGNOSIS:  Benjamin Staging  Malignant neoplasm of upper-outer quadrant of right breast in female, estrogen receptor positive ([place]) Staging form: Breast, AJCC 8th Edition - Clinical: Stage IB (cT1c, cN0, cM0, G3, ER+, PR-, HER2-) - Unsigned Stage prefix: Initial diagnosis Method of lymph node assessment: Clinical Histologic grading system: 3 grade system - Pathologic: Stage IIA (pT2, pN0, cM0, G3, ER+, PR-, HER2-) - Signed by Gardenia Phlegm, NP on 11/27/2021 Histologic grading system: 3 grade system  I connected with Susan Benjamin on 08/20/22 at  2:45 PM EST by telephone and verified that I am speaking with the correct person using two identifiers.  I discussed the limitations, risks, security and privacy concerns of performing an evaluation and management service by telephone and the availability of in person appointments.  I also discussed with the patient that there may be a patient responsible charge related to this service. The patient expressed understanding and agreed to proceed.   Patient location: home Provider location: [facility] office.    SUMMARY OF ONCOLOGIC HISTORY: Oncology History  Malignant neoplasm of upper-outer quadrant of right breast in female, estrogen receptor positive ([place])  08/08/2021 Imaging   August 08, 2021, patient had bilateral screening mammogram which showed 2 possible masses in the right breast which warrant further evaluation.  No concerning findings in the left breast. She had diagnostic mammogram on 09/13/2021 which noted possible mass in the upper central breast at middle depth which persists on additional views is 1.9 cm oval mass with indistinct margins.  Second previously noted possible mass in the upper outer breast at anterior to middle depth dissipates on additional views consistent with overlapping fibroglandular  tissue. Ultrasound showed suspicious 1.9 cm mass at the right breast 12 o'clock position for which ultrasound-guided biopsy was recommended no right axillary lymphadenopathy.    Genetic Testing   [facility] CancerNext-Expanded Panel was Negative. 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 Report date is 10/20/2021.  MSH6 VUS (p.N112S) was reclassified to likely benign. Report date is 11/21/2021.  The CancerNext-Expanded gene panel offered by [facility] and includes sequencing, rearrangement, and RNA analysis for the following 77 genes: AIP, ALK, APC, ATM, AXIN2, BAP1, BARD1, BLM, BMPR1A, BRCA1, BRCA2, BRIP1, CDC73, CDH1, CDK4, CDKN1B, CDKN2A, CHEK2, CTNNA1, DICER1, FANCC, FH, FLCN, GALNT12, KIF1B, LZTR1, MAX, MEN1, MET, MLH1, MSH2, MSH3, MSH6, MUTYH, NBN, NF1, NF2, NTHL1, PALB2, PHOX2B, PMS2, POT1, PRKAR1A, PTCH1, PTEN, RAD51C, RAD51D, RB1, RECQL, RET, SDHA, SDHAF2, SDHB, SDHC, SDHD, SMAD4, SMARCA4, SMARCB1, SMARCE1, STK11, SUFU, TMEM127, TP53, TSC1, TSC2, VHL and XRCC2 (sequencing and deletion/duplication); EGFR, EGLN1, HOXB13, KIT, MITF, PDGFRA, POLD1, and POLE (sequencing only); EPCAM and GREM1 (deletion/duplication only).    10/06/2021 Surgery   A. BREAST, RIGHT, LUMPECTOMY:  -  Invasive carcinoma of no special type (ductal), grade3 (total  Nottingham score 9), 25 mm in greatest dimension.  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  Prognostics from initial biopsy showed ER +30%, weak staining, PR 0%, negative, Ki-67 of 90%.  HER2 negative by IHC.     11/06/2021 - 01/11/2022 Chemotherapy   Patient is on Treatment Plan : BREAST TC q21d     11/27/2021 Benjamin Staging   Staging form: Breast, AJCC 8th Edition - Pathologic: Stage IIA (pT2, pN0, cM0, G3, ER+, PR-, HER2-) - Signed by  Gardenia Phlegm, NP on 11/27/2021 Histologic grading system: 3 grade system   02/13/2022 - 03/13/2022 Radiation Therapy   Site  Technique Total Dose (Gy) Dose per Fx (Gy) Completed Fx Beam Energies  Breast, Right: Breast_R 3D 42.56/42.56 2.66 16/16 6XFFF  Breast, Right: Breast_R_Bst 3D 8/8 2 4/4 6X, 10X     04/2022 -  Anti-estrogen oral therapy   Anastrozole     CURRENT THERAPY: Anastrozole (currently on hold)  INTERVAL HISTORY: Susan Benjamin 66 y.o. female returns for f/u to discuss how her pain is feeling without taking anastrozole.  She was last seen at our office 2 weeks ago for her survivorship visit and had been experiencing increased aches and pains since starting anastrozole.  She was recommended to take two weeks off to see if that would help.    Since stopping the Anastrozole her back pain has completely subsided.  She is elated to be in relief.  She has continued on Gabapentin, Meloxicam, and Robaxin which was prescribed.  She notes the aching in her legs and radiating pain is significantly improved.  She also notes her hot flashes have nearly resolved.     Patient Active Problem List   Diagnosis Date Noted   Muscle cramps 01/09/2022   Normocytic normochromic anemia 01/09/2022   Dysgeusia 12/19/2021   Genetic testing 10/09/2021   Malignant neoplasm of upper-outer quadrant of right breast in female, estrogen receptor positive (Susan Benjamin) 09/25/2021   Benign colon polyp 10/10/2012    has No Known Allergies.  MEDICAL HISTORY: Past Medical History:  Diagnosis Date   Anxiety    Benjamin ([place])    CIN I (cervical intraepithelial neoplasia I)    Ectopic pregnancy    X 2   Endometrial polyp    PONV (postoperative nausea and vomiting)    Seasonal allergies     SURGICAL HISTORY: Past Surgical History:  Procedure Laterality Date   BREAST LUMPECTOMY WITH RADIOACTIVE SEED AND SENTINEL LYMPH NODE BIOPSY Right 10/06/2021   Procedure: RIGHT BREAST LUMPECTOMY WITH RADIOACTIVE SEED AND SENTINEL LYMPH NODE BIOPSY;  Surgeon: Donnie Mesa, MD;  Location: [place];  Service: General;  Laterality:  Right;   CARPAL TUNNEL RELEASE     CERVICAL BIOPSY  W/ LOOP ELECTRODE EXCISION  2003   DILATION AND CURETTAGE OF UTERUS  2011   ECTOPIC PREGNANCY SURGERY     X 2-Right and Left salpingectomy   FOOT SURGERY     HYSTEROSCOPY  2011   endometrial polyp   PORTACATH PLACEMENT Right 10/06/2021   Procedure: INSERTION PORT-A-CATH;  Surgeon: Donnie Mesa, MD;  Location: [place];  Service: General;  Laterality: Right;   TUBAL LIGATION     tubal lig and tubal reversal   VAGINAL HYSTERECTOMY  2012    SOCIAL HISTORY: Social History   Socioeconomic History   Marital status: Married    Spouse name: Not on file   Number of children: Not on file   Years of education: Not on file   Highest education level: Not on file  Occupational History   Not on file  Tobacco Use   Smoking status: Never   Smokeless tobacco: Never  Vaping Use   Vaping Use: Never used  Substance and Sexual Activity   Alcohol use: Not Currently    Comment: rarely   Drug use: No   Sexual activity: Yes    Birth control/protection: Surgical    Comment: INTERCOURSE AGE 89, SEXUAL PARTNERS LESS THAN 5  Other Topics  Concern   Not on file  Social History Narrative   Not on file   Social Determinants of Health   Financial Resource Strain: Not on file  Food Insecurity: Not on file  Transportation Needs: Not on file  Physical Activity: Not on file  Stress: Not on file  Social Connections: Not on file  Intimate Partner Violence: Not on file    FAMILY HISTORY: Family History  Problem Relation Age of Onset   Diabetes Mother    Hypertension Mother    Stroke Mother    Heart disease Mother    Hypertension Father    Melanoma Father 44       metastasized   Breast Benjamin Cousin        dx. 40s    Review of Systems  Constitutional:  Negative for appetite change, chills, fatigue, fever and unexpected weight change.  HENT:   Negative for hearing loss, lump/mass and trouble swallowing.   Eyes:  Negative  for eye problems and icterus.  Respiratory:  Negative for chest tightness, cough and shortness of breath.   Cardiovascular:  Negative for chest pain, leg swelling and palpitations.  Gastrointestinal:  Negative for abdominal distention, abdominal pain, constipation, diarrhea, nausea and vomiting.  Endocrine: Negative for hot flashes.  Genitourinary:  Negative for difficulty urinating.   Musculoskeletal:  Negative for arthralgias.  Skin:  Negative for itching and rash.  Neurological:  Negative for dizziness, extremity weakness, headaches and numbness.  Hematological:  Negative for adenopathy. Does not bruise/bleed easily.  Psychiatric/Behavioral:  Negative for depression. The patient is not nervous/anxious.       PHYSICAL EXAMINATION Patient sounds well in no apparent distress, mood and behavior are normal.  Speech is normal.  Breathing is non labored   LABORATORY DATA: None for this visit   ASSESSMENT and THERAPY PLAN:   Malignant neoplasm of upper-outer quadrant of right breast in female, estrogen receptor positive ([place]) Ryen is a 66 year old woman with h/o stage IIA ER positive breast Benjamin diagnosed in 09/2021 s/p lumpectomy, adjuvant chemotherapy, adjuvant radiation therapy, and began antiestrogen therapy with Anastrozole which began on 04/2022 (on hold for past 2 weeks).    Brightyn's back pain has resolved after stopping the Anastrozole.  I recommended that she discontinue taking the anastrozole.  She was recommended to change her antiestrogen therapy to Letrozole.  Since this medication is in the same class of aromatase inhibitors like Anastrozole, she can have similar or worse side effects.  She may also tolerate the Letrozole better than the Anastrozole.    I sent in a prescription of letrozole for her to start. She will let us know if she is experiencing any difficulty with taking the medication.    Adrain will f/u with Dr. Chryl Heck in 10/2022.       All questions were answered.  The patient knows to call the clinic with any problems, questions or concerns. We can certainly see the patient much sooner if necessary.  Follow up instructions:    -Return to Benjamin center 10/2022 for f/u with Dr. Chryl Heck     The patient was provided an opportunity to ask questions and all were answered. The patient agreed with the plan and demonstrated an understanding of the instructions.   The patient was advised to call back or seek an in-person evaluation if the symptoms worsen or if the condition fails to improve as anticipated.   I provided 6 minutes of non face-to-face telephone visit time during this encounter, and >  50% was spent counseling as documented under my assessment & plan.   Wilber Bihari, NP 08/20/22 9:47 PM Medical Oncology and Hematology [facility] [place], [place] [address] Tel. [phone]    Fax. [phone]

## 2022-08-20 NOTE — Assessment & Plan Note (Signed)
Susan Benjamin is a 66 year old woman with h/o stage IIA ER positive breast cancer diagnosed in 09/2021 s/p lumpectomy, adjuvant chemotherapy, adjuvant radiation therapy, and began antiestrogen therapy with Anastrozole which began on 04/2022 (on hold for past 2 weeks).    Susan Benjamin's back pain has resolved after stopping the Anastrozole.  I recommended that she discontinue taking the anastrozole.  She was recommended to change her antiestrogen therapy to Letrozole.  Since this medication is in the same class of aromatase inhibitors like Anastrozole, she can have similar or worse side effects.  She may also tolerate the Letrozole better than the Anastrozole.    I sent in a prescription of letrozole for her to start. She will let us know if she is experiencing any difficulty with taking the medication.    Susan Benjamin will f/u with Dr. Chryl Heck in 10/2022.

## 2022-08-20 NOTE — Therapy (Signed)
OUTPATIENT PHYSICAL THERAPY SOZO SCREENING NOTE   Patient Name: Susan Benjamin MRN: [id number] DOB:[birth year], 66 y.o., female Today's Date: 08/20/2022  PCP: [name], MD REFERRING PROVIDER: [name], MD   PT End of Session - 08/20/22 0908     Visit Number 9   # unchanged due to screen only   PT Start Time 0906    PT Stop Time 0910    PT Time Calculation (min) 4 min    Activity Tolerance Patient tolerated treatment well    Behavior During Therapy WFL for tasks assessed/performed             Past Medical History:  Diagnosis Date   Anxiety    Cancer ([place])    CIN I (cervical intraepithelial neoplasia I)    Ectopic pregnancy    X 2   Endometrial polyp    PONV (postoperative nausea and vomiting)    Seasonal allergies    Past Surgical History:  Procedure Laterality Date   BREAST LUMPECTOMY WITH RADIOACTIVE SEED AND SENTINEL LYMPH NODE BIOPSY Right 10/06/2021   Procedure: RIGHT BREAST LUMPECTOMY WITH RADIOACTIVE SEED AND SENTINEL LYMPH NODE BIOPSY;  Surgeon: Donnie Mesa, MD;  Location: [facility];  Service: General;  Laterality: Right;   CARPAL TUNNEL RELEASE     CERVICAL BIOPSY  W/ LOOP ELECTRODE EXCISION  2003   DILATION AND CURETTAGE OF UTERUS  2011   ECTOPIC PREGNANCY SURGERY     X 2-Right and Left salpingectomy   FOOT SURGERY     HYSTEROSCOPY  2011   endometrial polyp   PORTACATH PLACEMENT Right 10/06/2021   Procedure: INSERTION PORT-A-CATH;  Surgeon: Donnie Mesa, MD;  Location: [facility];  Service: General;  Laterality: Right;   TUBAL LIGATION     tubal lig and tubal reversal   VAGINAL HYSTERECTOMY  2012   Patient Active Problem List   Diagnosis Date Noted   Muscle cramps 01/09/2022   Normocytic normochromic anemia 01/09/2022   Dysgeusia 12/19/2021   Genetic testing 10/09/2021   Malignant neoplasm of upper-outer quadrant of right breast in female, estrogen receptor positive ([place]) 09/25/2021   Benign colon polyp  10/10/2012    REFERRING DIAG: right breast cancer at risk for lymphedema  THERAPY DIAG: Aftercare following surgery for neoplasm  PERTINENT HISTORY:  Patient was diagnosed on 09/19/2021 with right 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 She underwent a right lumpectomy and sentinel node biopsy (3 negative nodes) on 10/06/2021   PRECAUTIONS: right UE Lymphedema risk, None  SUBJECTIVE: Pt returns for her 3 month L-Dex screen.   PAIN:  Are you having pain? No  SOZO SCREENING: Patient was assessed today using the SOZO machine to determine the lymphedema index score. This was compared to her baseline score. It was determined that she is within the recommended range when compared to her baseline and no further action is needed at this time. She will continue SOZO screenings. These are done every 3 months for 2 years post operatively followed by every 6 months for 2 years, and then annually.   L-DEX FLOWSHEETS - 08/20/22 0900       L-DEX LYMPHEDEMA SCREENING   Measurement Type Unilateral    L-DEX MEASUREMENT EXTREMITY Upper Extremity    POSITION  Standing    DOMINANT SIDE Right    At Risk Side Right    BASELINE SCORE (UNILATERAL) -4.4    L-DEX SCORE (UNILATERAL) -1.2    VALUE CHANGE (UNILAT) 3.2  Otelia Limes, PTA 08/20/2022, 9:09 AM

## 2022-08-23 ENCOUNTER — Ambulatory Visit
Admission: RE | Admit: 2022-08-23 | Discharge: 2022-08-23 | Disposition: A | Discharge status: Home / Self Care | Payer: Medicare Other | Source: Ambulatory Visit | Attending: Hematology and Oncology | Admitting: Hematology and Oncology

## 2022-08-23 ENCOUNTER — Ambulatory Visit
Admission: RE | Admit: 2022-08-23 | Discharge: 2022-08-23 | Disposition: A | Discharge status: Home / Self Care | Payer: BC Managed Care – PPO | Source: Ambulatory Visit | Attending: Hematology and Oncology | Admitting: Hematology and Oncology

## 2022-08-23 DIAGNOSIS — C50411 Malignant neoplasm of upper-outer quadrant of right female breast: Secondary | ICD-10-CM

## 2022-08-23 HISTORY — DX: Personal history of irradiation: Z92.3

## 2022-08-31 ENCOUNTER — Encounter: Payer: Self-pay | Admitting: Nurse Practitioner

## 2022-09-11 ENCOUNTER — Ambulatory Visit: Payer: Medicare Other | Admitting: Nurse Practitioner

## 2022-09-24 ENCOUNTER — Encounter: Payer: Self-pay | Admitting: Nurse Practitioner

## 2022-09-24 ENCOUNTER — Ambulatory Visit (INDEPENDENT_AMBULATORY_CARE_PROVIDER_SITE_OTHER): Payer: Medicare Other | Admitting: Nurse Practitioner

## 2022-09-24 VITALS — BP 118/80 | HR 83 | Ht 63.0 in | Wt 168.0 lb

## 2022-09-24 DIAGNOSIS — Z9189 Other specified personal risk factors, not elsewhere classified: Secondary | ICD-10-CM | POA: Diagnosis not present

## 2022-09-24 DIAGNOSIS — Z780 Asymptomatic menopausal state: Secondary | ICD-10-CM

## 2022-09-24 DIAGNOSIS — Z01419 Encounter for gynecological examination (general) (routine) without abnormal findings: Secondary | ICD-10-CM

## 2022-09-24 DIAGNOSIS — Z170 Estrogen receptor positive status [ER+]: Secondary | ICD-10-CM

## 2022-09-24 DIAGNOSIS — C50411 Malignant neoplasm of upper-outer quadrant of right female breast: Secondary | ICD-10-CM

## 2022-09-24 DIAGNOSIS — M8580 Other specified disorders of bone density and structure, unspecified site: Secondary | ICD-10-CM

## 2022-09-24 DIAGNOSIS — E785 Hyperlipidemia, unspecified: Secondary | ICD-10-CM

## 2022-09-24 NOTE — Progress Notes (Signed)
Susan Benjamin 66/24/1957 [id number]   History:  67 y.o. [id number] presents for breast and pelvic exam without GYN complaints. Postmenopausal - no HRT. S/P 2012 TVH for DUB. 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 09/2021 right breast DCIS ER+ managed with lumpectomy, radiation, chemo and anti-estrogen therapy 03/2022, did not tolerate Anastrozole, switched to [name].  Gynecologic History No LMP recorded. Patient has had a hysterectomy.   Contraception/Family planning: status post hysterectomy Sexually active: Yes  Health Maintenance Last Pap: 2011. Results were: Normal Last mammogram: 08/23/2022. Results were: Normal Last colonoscopy: 2021. Results were: Normal, 10-year recall Last Dexa: 08/23/2022. Results were: T-score -1.3, FRAX 8.4% / 0.8%  Past medical history, past surgical history, family history and social history were all reviewed and documented in the EPIC chart. Married. 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 Daughter - PACU nurse at [employer], has 81 yo daughter. 23 yo son, lives local, children ages 95, 44, and 82.  ROS:  A ROS was performed and pertinent positives and negatives are included.  Exam:  Vitals:   09/24/22 1325  BP: 118/80  Pulse: 83  SpO2: 97%  Weight: 168 lb (76.2 kg)  Height: '5\' 3"'$  (1.6 m)    Body mass index is 29.76 kg/m.  General appearance:  Normal Thyroid:  Symmetrical, normal in size, without palpable masses or nodularity. Respiratory  Auscultation:  Clear without wheezing or rhonchi Cardiovascular  Auscultation:  Regular rate, without rubs, murmurs or gallops  Edema/varicosities:  Not grossly evident Abdominal  Soft,nontender, without masses, guarding or rebound.  Liver/spleen:  No organomegaly noted  Hernia:  None appreciated  Skin  Inspection:  Grossly normal Breasts: Examined lying and sitting.   Right: Without masses, retractions, nipple discharge or axillary adenopathy. Lumpectomy scar.    Left: Without masses, retractions,  nipple discharge or axillary adenopathy. Genitourinary   Inguinal/mons:  Normal without inguinal adenopathy  External genitalia:  Normal appearing vulva with no masses, tenderness, or lesions  BUS/Urethra/Skene's glands:  Normal  Vagina:  Normal appearing with normal color and discharge, no lesions. Atrophic changes  Cervix:  Absent  Uterus:  Absent  Adnexa/parametria:     Rt: Normal in size, without masses or tenderness.   Lt: Normal in size, without masses or tenderness.  Anus and perineum: Normal  Digital rectal exam: Normal sphincter tone without palpated masses or tenderness  Patient informed chaperone available to be present for breast and pelvic exam. Patient has requested no chaperone to be present. Patient has been advised what will be completed during breast and pelvic exam.   Assessment/Plan:  67 y.o. [id number] for breast and pelvic exam.   Encounter for breast and pelvic examination - Education provided on SBEs, importance of preventative screenings, current guidelines, high calcium diet, regular exercise, and multivitamin daily. Plans to start seeing PCP annually due to medicare requirements. Will get lipid panel then.   Postmenopausal - no HRT, S/P 2012 TVH  Malignant neoplasm of upper-outer quadrant of right breast in female, estrogen receptor positive ([name]) - 09/2021 right breast DCIS ER+ managed with lumpectomy, radiation and anti-estrogen therapy 03/2022, did not tolerate Anastrozole, switched to [name]. Most recent mammogram 08/2022, normal surgical changes.   Osteopenia, unspecified location - T-score -1.3 without elevated FRAX. Vitamin D + Calcium and regular exercise recommended.   Screening for cervical cancer - 2003 LEEP CIN-1, subsequent paps normal. No longer screening per guidelines.   Screening for colon cancer - 2021 colonoscopy. Will repeat at GI's recommended interval.   Return in 1  year for breast and pelvic exam.     Susan Benjamin A Susan Prather DNP, 1:40 PM  09/24/2022

## 2022-10-04 ENCOUNTER — Encounter: Payer: Self-pay | Admitting: Genetic Counselor

## 2022-10-11 ENCOUNTER — Encounter: Payer: Self-pay | Admitting: Hematology and Oncology

## 2022-10-11 ENCOUNTER — Inpatient Hospital Stay: Payer: Medicare Other | Attending: Adult Health | Admitting: Hematology and Oncology

## 2022-10-11 ENCOUNTER — Other Ambulatory Visit: Payer: Self-pay

## 2022-10-11 VITALS — BP 138/80 | HR 78 | Temp 97.6°F | Resp 16 | Ht 63.0 in | Wt 174.6 lb

## 2022-10-11 DIAGNOSIS — Z803 Family history of malignant neoplasm of breast: Secondary | ICD-10-CM | POA: Diagnosis not present

## 2022-10-11 DIAGNOSIS — Z79811 Long term (current) use of aromatase inhibitors: Secondary | ICD-10-CM | POA: Diagnosis not present

## 2022-10-11 DIAGNOSIS — C50411 Malignant neoplasm of upper-outer quadrant of right female breast: Secondary | ICD-10-CM | POA: Insufficient documentation

## 2022-10-11 DIAGNOSIS — M8580 Other specified disorders of bone density and structure, unspecified site: Secondary | ICD-10-CM | POA: Diagnosis not present

## 2022-10-11 DIAGNOSIS — Z808 Family history of malignant neoplasm of other organs or systems: Secondary | ICD-10-CM | POA: Diagnosis not present

## 2022-10-11 DIAGNOSIS — Z170 Estrogen receptor positive status [ER+]: Secondary | ICD-10-CM | POA: Insufficient documentation

## 2022-10-11 NOTE — Progress Notes (Signed)
[facility] Follow up:    Susan Dus, MD (Inactive) [place] [id number]   DIAGNOSIS:  Cancer Staging  Malignant neoplasm of upper-outer quadrant of right breast in female, estrogen receptor positive ([place]) Staging form: Breast, AJCC 8th Edition - Clinical: Stage IB (cT1c, cN0, cM0, G3, ER+, PR-, HER2-) - Unsigned Stage prefix: Initial diagnosis Method of lymph node assessment: Clinical Histologic grading system: 3 grade system - Pathologic: Stage IIA (pT2, pN0, cM0, G3, ER+, PR-, HER2-) - Signed by Gardenia Phlegm, NP on 11/27/2021 Histologic grading system: 3 grade system   SUMMARY OF ONCOLOGIC HISTORY: Oncology History  Malignant neoplasm of upper-outer quadrant of right breast in female, estrogen receptor positive ([place])  08/08/2021 Imaging   August 08, 2021, patient had bilateral screening mammogram which showed 2 possible masses in the right breast which warrant further evaluation.  No concerning findings in the left breast. She had diagnostic mammogram on 09/13/2021 which noted possible mass in the upper central breast at middle depth which persists on additional views is 1.9 cm oval mass with indistinct margins.  Second previously noted possible mass in the upper outer breast at anterior to middle depth dissipates on additional views consistent with overlapping fibroglandular tissue. Ultrasound showed suspicious 1.9 cm mass at the right breast 12 o'clock position for which ultrasound-guided biopsy was recommended no right axillary lymphadenopathy.    Genetic Testing   [facility] CancerNext-Expanded Panel was Negative. 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 Report date is 10/20/2021.  MSH6 VUS (p.N112S) was reclassified to likely benign. Report date is 11/21/2021.  The CancerNext-Expanded gene panel offered by [facility] and includes sequencing, rearrangement, and RNA analysis  for the following 77 genes: AIP, ALK, APC, ATM, AXIN2, BAP1, BARD1, BLM, BMPR1A, BRCA1, BRCA2, BRIP1, CDC73, CDH1, CDK4, CDKN1B, CDKN2A, CHEK2, CTNNA1, DICER1, FANCC, FH, FLCN, GALNT12, KIF1B, LZTR1, MAX, MEN1, MET, MLH1, MSH2, MSH3, MSH6, MUTYH, NBN, NF1, NF2, NTHL1, PALB2, PHOX2B, PMS2, POT1, PRKAR1A, PTCH1, PTEN, RAD51C, RAD51D, RB1, RECQL, RET, SDHA, SDHAF2, SDHB, SDHC, SDHD, SMAD4, SMARCA4, SMARCB1, SMARCE1, STK11, SUFU, TMEM127, TP53, TSC1, TSC2, VHL and XRCC2 (sequencing and deletion/duplication); EGFR, EGLN1, HOXB13, KIT, MITF, PDGFRA, POLD1, and POLE (sequencing only); EPCAM and GREM1 (deletion/duplication only).    10/06/2021 Surgery   A. BREAST, RIGHT, LUMPECTOMY:  -  Invasive carcinoma of no special type (ductal), grade3 (total  Nottingham score 9), 25 mm in greatest dimension.  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  Prognostics from initial biopsy showed ER +30%, weak staining, PR 0%, negative, Ki-67 of 90%.  HER2 negative by IHC.     11/06/2021 - 01/11/2022 Chemotherapy   Patient is on Treatment Plan : BREAST TC q21d     11/27/2021 Cancer Staging   Staging form: Breast, AJCC 8th Edition - Pathologic: Stage IIA (pT2, pN0, cM0, G3, ER+, PR-, HER2-) - Signed by Gardenia Phlegm, NP on 11/27/2021 Histologic grading system: 3 grade system   02/13/2022 - 03/13/2022 Radiation Therapy   Site Technique Total Dose (Gy) Dose per Fx (Gy) Completed Fx Beam Energies  Breast, Right: Breast_R 3D 42.56/42.56 2.66 16/16 6XFFF  Breast, Right: Breast_R_Bst 3D 8/8 2 4/4 6X, 10X    04/2022 -  Anti-estrogen oral therapy   Anastrozole     CURRENT THERAPY: letrozole  INTERVAL HISTORY:  Susan Benjamin 66 y.o. female returns for f/u on letrozole. She still complains  of back pain and hip pains and she says this is better with letrozole than anastrozole. She is taking tylenol and gabapentin for the back pain and this helps. Mammogram Aug 23, 2022, neg for  malignancy. Bone density Dec 2023 showed osteopenia. She is working out regularly, running a 5 K soon, planning her next trip. She is feeling very well overall. Rest of the pertinent 10 point ROS reviewed and neg.  Patient Active Problem List   Diagnosis Date Noted   Muscle cramps 01/09/2022   Normocytic normochromic anemia 01/09/2022   Dysgeusia 12/19/2021   Genetic testing 10/09/2021   Malignant neoplasm of upper-outer quadrant of right breast in female, estrogen receptor positive ([place]) 09/25/2021   Benign colon polyp 10/10/2012    has No Known Allergies.  MEDICAL HISTORY: Past Medical History:  Diagnosis Date   Anxiety    Cancer ([place])    CIN I (cervical intraepithelial neoplasia I)    Ectopic pregnancy    X 2   Endometrial polyp    Personal history of radiation therapy    PONV (postoperative nausea and vomiting)    Seasonal allergies     SURGICAL HISTORY: Past Surgical History:  Procedure Laterality Date   BREAST LUMPECTOMY     BREAST LUMPECTOMY WITH RADIOACTIVE SEED AND SENTINEL LYMPH NODE BIOPSY Right 10/06/2021   Procedure: RIGHT BREAST LUMPECTOMY WITH RADIOACTIVE SEED AND SENTINEL LYMPH NODE BIOPSY;  Surgeon: Donnie Mesa, MD;  Location: [facility];  Service: General;  Laterality: Right;   CARPAL TUNNEL RELEASE     CERVICAL BIOPSY  W/ LOOP ELECTRODE EXCISION  09/03/2001   DILATION AND CURETTAGE OF UTERUS  09/03/2009   ECTOPIC PREGNANCY SURGERY     X 2-Right and Left salpingectomy   FOOT SURGERY     HYSTEROSCOPY  09/03/2009   endometrial polyp   PORTACATH PLACEMENT Right 10/06/2021   Procedure: INSERTION PORT-A-CATH;  Surgeon: Donnie Mesa, MD;  Location: [facility];  Service: General;  Laterality: Right;   TUBAL LIGATION     tubal lig and tubal reversal   VAGINAL HYSTERECTOMY  09/03/2010    SOCIAL HISTORY: Social History   Socioeconomic History   Marital status: Married    Spouse name: Not on file   Number of children:  Not on file   Years of education: Not on file   Highest education level: Not on file  Occupational History   Not on file  Tobacco Use   Smoking status: Never   Smokeless tobacco: Never  Vaping Use   Vaping Use: Never used  Substance and Sexual Activity   Alcohol use: Not Currently    Comment: rarely   Drug use: No   Sexual activity: Yes    Birth control/protection: Surgical    Comment: Hyst, First IC <16y/o, <5 Partners  Other Topics Concern   Not on file  Social History Narrative   Not on file   Social Determinants of Health   Financial Resource Strain: Not on file  Food Insecurity: Not on file  Transportation Needs: Not on file  Physical Activity: Not on file  Stress: Not on file  Social Connections: Not on file  Intimate Partner Violence: Not on file    FAMILY HISTORY: Family History  Problem Relation Age of Onset   Diabetes Mother    Hypertension Mother    Stroke Mother    Heart disease Mother    Hypertension Father    Melanoma Father 54  metastasized   Breast cancer Cousin        dx. 40s     PHYSICAL EXAMINATION Physical Exam Constitutional:      Appearance: Normal appearance.  HENT:     Head: Normocephalic and atraumatic.  Chest:     Comments: Bilateral breasts inspected. No palpable masses or regional adenopathy. Musculoskeletal:        General: No swelling.  Skin:    General: Skin is warm and dry.  Neurological:     Mental Status: She is alert.      LABORATORY DATA: None for this visit   ASSESSMENT and THERAPY PLAN:    Malignant neoplasm of upper-outer quadrant of right breast in female, estrogen receptor positive ([place])  This is a very pleasant 67 year old postmenopausal female patient with no significant past medical history referred to medical oncology for new diagnosis of right breast invasive ductal carcinoma.  We have discussed about consideration for adjuvant chemotherapy given her tumor with high grade, high  proliferation index of 90% on the original specimen, The tumor has many unfavorable features, including grade 3, ER weakly positive, PR negative, Ki 90%. We discussed about oncotype testing however given several unfavorable features, she is now receiving adjuvant docetaxel and cyclophosphamide.   She completed adjuvant chemotherapy on 01/09/2022. She completed adjuvant radiation on 6/27 She is now on anti estrogen therapy with anastrozole around mid July. This was switched to letrozole Dec 2023. She is tolerating letrozole better than anastrozole. No concerning PE findings. I applauded her on the workout and staying active. At this time, she will continue regular exercises and Vit D 1000 IU daily. RTC in 6 months.  Total time spent: 30 min  All questions were answered. The patient knows to call the clinic with any problems, questions or concerns. We can certainly see the patient much sooner if necessary.

## 2022-10-26 ENCOUNTER — Other Ambulatory Visit: Payer: Self-pay

## 2022-10-26 DIAGNOSIS — M5416 Radiculopathy, lumbar region: Secondary | ICD-10-CM

## 2022-10-26 MED ORDER — METHOCARBAMOL 500 MG PO TABS: 500.0000 mg | ORAL_TABLET | Freq: Four times a day (QID) | ORAL | 0 refills | Status: AC | PRN

## 2022-11-08 ENCOUNTER — Ambulatory Visit: Payer: Medicare Other | Admitting: Neurosurgery

## 2022-11-12 ENCOUNTER — Ambulatory Visit: Payer: BC Managed Care – PPO | Attending: Surgery

## 2022-11-12 DIAGNOSIS — C50411 Malignant neoplasm of upper-outer quadrant of right female breast: Secondary | ICD-10-CM | POA: Insufficient documentation

## 2022-11-12 DIAGNOSIS — Z483 Aftercare following surgery for neoplasm: Secondary | ICD-10-CM | POA: Insufficient documentation

## 2022-11-12 DIAGNOSIS — Z170 Estrogen receptor positive status [ER+]: Secondary | ICD-10-CM | POA: Insufficient documentation

## 2022-11-19 ENCOUNTER — Ambulatory Visit: Payer: BC Managed Care – PPO | Admitting: Rehabilitation

## 2022-11-19 DIAGNOSIS — C50411 Malignant neoplasm of upper-outer quadrant of right female breast: Secondary | ICD-10-CM

## 2022-11-19 DIAGNOSIS — Z483 Aftercare following surgery for neoplasm: Secondary | ICD-10-CM

## 2022-11-19 NOTE — Therapy (Signed)
OUTPATIENT PHYSICAL THERAPY SOZO SCREENING NOTE   Patient Name: Susan Benjamin MRN: [id number] DOB:02-05-1956, 67 y.o., female Today's Date: 11/19/2022  PCP: [name], MD (Inactive) REFERRING PROVIDER: Donnie Mesa, MD   PT End of Session - 11/19/22 0912     Visit Number 9   screen only   PT Start Time 0913    PT Stop Time 0916    PT Time Calculation (min) 3 min    Activity Tolerance Patient tolerated treatment well    Behavior During Therapy WFL for tasks assessed/performed              Past Medical History:  Diagnosis Date   Anxiety    Cancer ([place])    CIN I (cervical intraepithelial neoplasia I)    Ectopic pregnancy    X 2   Endometrial polyp    Personal history of radiation therapy    PONV (postoperative nausea and vomiting)    Seasonal allergies    Past Surgical History:  Procedure Laterality Date   BREAST LUMPECTOMY     BREAST LUMPECTOMY WITH RADIOACTIVE SEED AND SENTINEL LYMPH NODE BIOPSY Right 10/06/2021   Procedure: RIGHT BREAST LUMPECTOMY WITH RADIOACTIVE SEED AND SENTINEL LYMPH NODE BIOPSY;  Surgeon: Donnie Mesa, MD;  Location: [facility];  Service: General;  Laterality: Right;   CARPAL TUNNEL RELEASE     CERVICAL BIOPSY  W/ LOOP ELECTRODE EXCISION  09/03/2001   DILATION AND CURETTAGE OF UTERUS  09/03/2009   ECTOPIC PREGNANCY SURGERY     X 2-Right and Left salpingectomy   FOOT SURGERY     HYSTEROSCOPY  09/03/2009   endometrial polyp   PORTACATH PLACEMENT Right 10/06/2021   Procedure: INSERTION PORT-A-CATH;  Surgeon: Donnie Mesa, MD;  Location: [facility];  Service: General;  Laterality: Right;   TUBAL LIGATION     tubal lig and tubal reversal   VAGINAL HYSTERECTOMY  09/03/2010   Patient Active Problem List   Diagnosis Date Noted   Muscle cramps 01/09/2022   Normocytic normochromic anemia 01/09/2022   Dysgeusia 12/19/2021   Genetic testing 10/09/2021   Malignant neoplasm of upper-outer quadrant of  right breast in female, estrogen receptor positive ([place]) 09/25/2021   Benign colon polyp 10/10/2012    REFERRING DIAG: right breast cancer at risk for lymphedema  THERAPY DIAG: Aftercare following surgery for neoplasm  Malignant neoplasm of upper-outer quadrant of right breast in female, estrogen receptor positive ([place])  PERTINENT HISTORY:  Patient was diagnosed on 09/19/2021 with right 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 She underwent a right lumpectomy and sentinel node biopsy (3 negative nodes) on 10/06/2021   PRECAUTIONS: right UE Lymphedema risk, None  SUBJECTIVE: Pt returns for her 3 month L-Dex screen.   PAIN:  Are you having pain? No  SOZO SCREENING: Patient was assessed today using the SOZO machine to determine the lymphedema index score. This was compared to her baseline score. It was determined that she is within the recommended range when compared to her baseline and no further action is needed at this time. She will continue SOZO screenings. These are done every 3 months for 2 years post operatively followed by every 6 months for 2 years, and then annually.   L-DEX FLOWSHEETS - 11/19/22 0900       L-DEX LYMPHEDEMA SCREENING   Measurement Type Unilateral    L-DEX MEASUREMENT EXTREMITY Upper Extremity    POSITION  Standing    DOMINANT SIDE Right    At Risk Side  Right    BASELINE SCORE (UNILATERAL) -4.4    L-DEX SCORE (UNILATERAL) 0.6    VALUE CHANGE (UNILAT) 5                Sherrine Salberg, Adrian Prince, PT 11/19/2022, 9:16 AM

## 2022-11-23 ENCOUNTER — Other Ambulatory Visit: Payer: Self-pay | Admitting: Neurosurgery

## 2022-11-23 ENCOUNTER — Telehealth: Payer: Self-pay

## 2022-11-23 NOTE — Telephone Encounter (Signed)
Left message to call back  

## 2022-11-23 NOTE — Telephone Encounter (Signed)
Received a refill request for gabapentin. I approved this. However, Dr Rhea Bleacher last note from 07/2022 says he wants to see her back in 3-4 months and I do not see any future appointments scheduled.  Please offer her a return appointment with him. If she is still doing well and declines an appointment, that is OK. However, she will need to transition the refills of the medications from [place] to her PCP (gabapentin, meloxicam, methocarbamol).   Thanks

## 2022-11-27 NOTE — Telephone Encounter (Signed)
Left message to call back  

## 2022-11-27 NOTE — Telephone Encounter (Signed)
Per patient refill request was supposed to have gone to her PCP. Disregard any more request sent to our office.

## 2022-11-28 ENCOUNTER — Telehealth: Payer: Self-pay | Admitting: *Deleted

## 2022-11-28 NOTE — Telephone Encounter (Signed)
This RN spoke with pt per her call stating she has noted leg cramps since being on the letrozole - she held the letrozole with noted improvement and then resumed medication- with return of leg cramps.  Leg cramps are bilateral and episodic.  She states she has not used anything directly - was on gabapentin for back discomfort but she states that did not give relief.  Per phone discussion symptom discussed with noted desire of pt wanting to stay on letrozole.  This RN discussed use of tonic water ( due to the quinine ) and magnesium supplement.  Pt will try the above and let this RN know if it is of benefit.

## 2022-11-29 ENCOUNTER — Telehealth: Payer: Self-pay | Admitting: Hematology and Oncology

## 2022-11-29 NOTE — Telephone Encounter (Signed)
Rescheduled appointment per error. Left voicemail.

## 2023-03-11 ENCOUNTER — Ambulatory Visit: Payer: BC Managed Care – PPO | Attending: Surgery

## 2023-03-11 VITALS — Wt 163.0 lb

## 2023-03-11 DIAGNOSIS — Z483 Aftercare following surgery for neoplasm: Secondary | ICD-10-CM | POA: Insufficient documentation

## 2023-03-11 NOTE — Therapy (Signed)
OUTPATIENT PHYSICAL THERAPY SOZO SCREENING NOTE   Patient Name: Susan Benjamin MRN: [id number] DOB:[birth year], 67 y.o., female Today's Date: 03/11/2023  PCP: [name], MD (Inactive) REFERRING PROVIDER: Manus Rudd, MD   PT End of Session - 03/11/23 1019     Visit Number 9   # unchanged due to screen only   PT Start Time 1017    PT Stop Time 1021    PT Time Calculation (min) 4 min    Activity Tolerance Patient tolerated treatment well    Behavior During Therapy WFL for tasks assessed/performed              Past Medical History:  Diagnosis Date   Anxiety    Cancer (HCC)    CIN I (cervical intraepithelial neoplasia I)    Ectopic pregnancy    X 2   Endometrial polyp    Personal history of radiation therapy    PONV (postoperative nausea and vomiting)    Seasonal allergies    Past Surgical History:  Procedure Laterality Date   BREAST LUMPECTOMY     BREAST LUMPECTOMY WITH RADIOACTIVE SEED AND SENTINEL LYMPH NODE BIOPSY Right 10/06/2021   Procedure: RIGHT BREAST LUMPECTOMY WITH RADIOACTIVE SEED AND SENTINEL LYMPH NODE BIOPSY;  Surgeon: Manus Rudd, MD;  Location: [facility];  Service: General;  Laterality: Right;   CARPAL TUNNEL RELEASE     CERVICAL BIOPSY  W/ LOOP ELECTRODE EXCISION  09/03/2001   DILATION AND CURETTAGE OF UTERUS  09/03/2009   ECTOPIC PREGNANCY SURGERY     X 2-Right and Left salpingectomy   FOOT SURGERY     HYSTEROSCOPY  09/03/2009   endometrial polyp   PORTACATH PLACEMENT Right 10/06/2021   Procedure: INSERTION PORT-A-CATH;  Surgeon: Manus Rudd, MD;  Location: [facility];  Service: General;  Laterality: Right;   TUBAL LIGATION     tubal lig and tubal reversal   VAGINAL HYSTERECTOMY  09/03/2010   Patient Active Problem List   Diagnosis Date Noted   Muscle cramps 01/09/2022   Normocytic normochromic anemia 01/09/2022   Dysgeusia 12/19/2021   Genetic testing 10/09/2021   Malignant neoplasm of  upper-outer quadrant of right breast in female, estrogen receptor positive (HCC) 09/25/2021   Benign colon polyp 10/10/2012    REFERRING DIAG: right breast cancer at risk for lymphedema  THERAPY DIAG: Aftercare following surgery for neoplasm  PERTINENT HISTORY:  Patient was diagnosed on 09/19/2021 with right 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 She underwent a right lumpectomy and sentinel node biopsy (3 negative nodes) on 10/06/2021   PRECAUTIONS: right UE Lymphedema risk, None  SUBJECTIVE: Pt returns for her 3 month L-Dex screen.   PAIN:  Are you having pain? No  SOZO SCREENING: Patient was assessed today using the SOZO machine to determine the lymphedema index score. This was compared to her baseline score. It was determined that she is within the recommended range when compared to her baseline and no further action is needed at this time. She will continue SOZO screenings. These are done every 3 months for 2 years post operatively followed by every 6 months for 2 years, and then annually.   L-DEX FLOWSHEETS - 03/11/23 1000       L-DEX LYMPHEDEMA SCREENING   Measurement Type Unilateral    L-DEX MEASUREMENT EXTREMITY Upper Extremity    POSITION  Standing    DOMINANT SIDE Right    At Risk Side Right    BASELINE SCORE (UNILATERAL) -4.4  L-DEX SCORE (UNILATERAL) -0.5    VALUE CHANGE (UNILAT) 3.9                Hermenia Bers, PTA 03/11/2023, 10:20 AM

## 2023-04-11 ENCOUNTER — Ambulatory Visit: Payer: Medicare Other | Admitting: Hematology and Oncology

## 2023-04-22 ENCOUNTER — Ambulatory Visit: Payer: Medicare Other | Admitting: Hematology and Oncology

## 2023-05-17 ENCOUNTER — Inpatient Hospital Stay: Payer: BC Managed Care – PPO | Attending: Hematology and Oncology | Admitting: Hematology and Oncology

## 2023-05-17 VITALS — BP 138/79 | HR 75 | Temp 98.3°F | Resp 15 | Wt 160.0 lb

## 2023-05-17 DIAGNOSIS — C50411 Malignant neoplasm of upper-outer quadrant of right female breast: Secondary | ICD-10-CM | POA: Insufficient documentation

## 2023-05-17 DIAGNOSIS — Z923 Personal history of irradiation: Secondary | ICD-10-CM | POA: Insufficient documentation

## 2023-05-17 DIAGNOSIS — R252 Cramp and spasm: Secondary | ICD-10-CM | POA: Insufficient documentation

## 2023-05-17 DIAGNOSIS — Z170 Estrogen receptor positive status [ER+]: Secondary | ICD-10-CM | POA: Insufficient documentation

## 2023-05-17 DIAGNOSIS — M8580 Other specified disorders of bone density and structure, unspecified site: Secondary | ICD-10-CM | POA: Diagnosis not present

## 2023-05-17 DIAGNOSIS — Z808 Family history of malignant neoplasm of other organs or systems: Secondary | ICD-10-CM | POA: Diagnosis not present

## 2023-05-17 DIAGNOSIS — Z803 Family history of malignant neoplasm of breast: Secondary | ICD-10-CM | POA: Insufficient documentation

## 2023-05-17 DIAGNOSIS — Z9221 Personal history of antineoplastic chemotherapy: Secondary | ICD-10-CM | POA: Insufficient documentation

## 2023-05-17 DIAGNOSIS — Z79811 Long term (current) use of aromatase inhibitors: Secondary | ICD-10-CM | POA: Insufficient documentation

## 2023-05-17 NOTE — Assessment & Plan Note (Signed)
  This is a very pleasant 67 year old postmenopausal female patient with no significant past medical history referred to medical oncology for new diagnosis of right breast invasive ductal carcinoma.  We have discussed about consideration for adjuvant chemotherapy given her tumor with high grade, high proliferation index of 90% on the original specimen, The tumor has many unfavorable features, including grade 3, ER weakly positive, PR negative, Ki 90%. We discussed about oncotype testing however given several unfavorable features, she is now receiving adjuvant docetaxel and cyclophosphamide.   She completed adjuvant chemotherapy on 01/09/2022. She completed adjuvant radiation on 6/27 She was tried on anti estrogen therapy with anastrozole around mid July. This was switched to letrozole Dec 2023. She is tolerating letrozole better than anastrozole.  PLAN  On Letrozole with mild side effects (leg cramps). No changes in breast exam. -Continue Letrozole. -Continue self-breast exams  General Health Maintenance Active lifestyle with walking and occasional 5Ks. Mild osteopenia. -Continue walking and consider adding strength training. -Continue Vitamin D supplementation and calcium-rich diet. -Schedule mammogram for January 3rd, 2025. -Follow-up in six months (Spring 2025).

## 2023-05-17 NOTE — Progress Notes (Signed)
[facility] Cancer Follow up:    Susan Else, MD (Inactive) [phone] [address] [place] [address]   DIAGNOSIS:  Cancer Staging  Malignant neoplasm of upper-outer quadrant of right breast in female, estrogen receptor positive (HCC) Staging form: Breast, AJCC 8th Edition - Clinical: Stage IB (cT1c, cN0, cM0, G3, ER+, PR-, HER2-) - Unsigned Stage prefix: Initial diagnosis Method of lymph node assessment: Clinical Histologic grading system: 3 grade system - Pathologic: Stage IIA (pT2, pN0, cM0, G3, ER+, PR-, HER2-) - Signed by Loa Socks, NP on 11/27/2021 Histologic grading system: 3 grade system   SUMMARY OF ONCOLOGIC HISTORY: Oncology History  Malignant neoplasm of upper-outer quadrant of right breast in female, estrogen receptor positive (HCC)  08/08/2021 Imaging   August 08, 2021, patient had bilateral screening mammogram which showed 2 possible masses in the right breast which warrant further evaluation.  No concerning findings in the left breast. She had diagnostic mammogram on 09/13/2021 which noted possible mass in the upper central breast at middle depth which persists on additional views is 1.9 cm oval mass with indistinct margins.  Second previously noted possible mass in the upper outer breast at anterior to middle depth dissipates on additional views consistent with overlapping fibroglandular tissue. Ultrasound showed suspicious 1.9 cm mass at the right breast 12 o'clock position for which ultrasound-guided biopsy was recommended no right axillary lymphadenopathy.    Genetic Testing   [facility] CancerNext-Expanded Panel was Negative. 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 Report date is 10/20/2021.  MSH6 VUS (p.N112S) was reclassified to likely benign. Report date is 11/21/2021.  The CancerNext-Expanded gene panel offered by [facility] and includes sequencing, rearrangement, and RNA analysis  for the following 77 genes: AIP, ALK, APC, ATM, AXIN2, BAP1, BARD1, BLM, BMPR1A, BRCA1, BRCA2, BRIP1, CDC73, CDH1, CDK4, CDKN1B, CDKN2A, CHEK2, CTNNA1, DICER1, FANCC, FH, FLCN, GALNT12, KIF1B, LZTR1, MAX, MEN1, MET, MLH1, MSH2, MSH3, MSH6, MUTYH, NBN, NF1, NF2, NTHL1, PALB2, PHOX2B, PMS2, POT1, PRKAR1A, PTCH1, PTEN, RAD51C, RAD51D, RB1, RECQL, RET, SDHA, SDHAF2, SDHB, SDHC, SDHD, SMAD4, SMARCA4, SMARCB1, SMARCE1, STK11, SUFU, TMEM127, TP53, TSC1, TSC2, VHL and XRCC2 (sequencing and deletion/duplication); EGFR, EGLN1, HOXB13, KIT, MITF, PDGFRA, POLD1, and POLE (sequencing only); EPCAM and GREM1 (deletion/duplication only).    10/06/2021 Surgery   A. BREAST, RIGHT, LUMPECTOMY:  -  Invasive carcinoma of no special type (ductal), grade3 (total  Nottingham score 9), 25 mm in greatest dimension.  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  Prognostics from initial biopsy showed ER +30%, weak staining, PR 0%, negative, Ki-67 of 90%.  HER2 negative by IHC.     11/06/2021 - 01/11/2022 Chemotherapy   Patient is on Treatment Plan : BREAST TC q21d     11/27/2021 Cancer Staging   Staging form: Breast, AJCC 8th Edition - Pathologic: Stage IIA (pT2, pN0, cM0, G3, ER+, PR-, HER2-) - Signed by Loa Socks, NP on 11/27/2021 Histologic grading system: 3 grade system   02/13/2022 - 03/13/2022 Radiation Therapy   Site Technique Total Dose (Gy) Dose per Fx (Gy) Completed Fx Beam Energies  Breast, Right: Breast_R 3D 42.56/42.56 2.66 16/16 6XFFF  Breast, Right: Breast_R_Bst 3D 8/8 2 4/4 6X, 10X     04/2022 -  Anti-estrogen oral therapy   Anastrozole     CURRENT THERAPY: letrozole  INTERVAL HISTORY:  Discussed the use of AI scribe software for clinical note transcription with the  patient, who gave verbal consent to proceed.  History of Present Illness   The patient, with a history of breast cancer, presents for a routine follow-up. She reports adherence to her  medication regimen, including Letrozole, and has experienced some leg cramps as a side effect. Despite this, she has remained active, participating in 5K walks and attempting to lose weight. She has also been taking vitamin D as recommended.  In addition to managing her own health, the patient has been caring for her mother-in-law, who is in the early stages of dementia. This has been a challenging time for her, as she has had to move in with her and navigate her changing personality and memory issues. She expresses interest in continuing to exercise and maintain her health.       Patient Active Problem List   Diagnosis Date Noted   Muscle cramps 01/09/2022   Normocytic normochromic anemia 01/09/2022   Dysgeusia 12/19/2021   Genetic testing 10/09/2021   Malignant neoplasm of upper-outer quadrant of right breast in female, estrogen receptor positive (HCC) 09/25/2021   Benign colon polyp 10/10/2012    has No Known Allergies.  MEDICAL HISTORY: Past Medical History:  Diagnosis Date   Anxiety    Cancer (HCC)    CIN I (cervical intraepithelial neoplasia I)    Ectopic pregnancy    X 2   Endometrial polyp    Personal history of radiation therapy    PONV (postoperative nausea and vomiting)    Seasonal allergies     SURGICAL HISTORY: Past Surgical History:  Procedure Laterality Date   BREAST LUMPECTOMY     BREAST LUMPECTOMY WITH RADIOACTIVE SEED AND SENTINEL LYMPH NODE BIOPSY Right 10/06/2021   Procedure: RIGHT BREAST LUMPECTOMY WITH RADIOACTIVE SEED AND SENTINEL LYMPH NODE BIOPSY;  Surgeon: Manus Rudd, MD;  Location: [facility];  Service: General;  Laterality: Right;   CARPAL TUNNEL RELEASE     CERVICAL BIOPSY  W/ LOOP ELECTRODE EXCISION  09/03/2001   DILATION AND CURETTAGE OF UTERUS  09/03/2009   ECTOPIC PREGNANCY SURGERY     X 2-Right and Left salpingectomy   FOOT SURGERY     HYSTEROSCOPY  09/03/2009   endometrial polyp   PORTACATH PLACEMENT Right 10/06/2021    Procedure: INSERTION PORT-A-CATH;  Surgeon: Manus Rudd, MD;  Location: [facility];  Service: General;  Laterality: Right;   TUBAL LIGATION     tubal lig and tubal reversal   VAGINAL HYSTERECTOMY  09/03/2010    SOCIAL HISTORY: Social History   Socioeconomic History   Marital status: Married    Spouse name: Not on file   Number of children: Not on file   Years of education: Not on file   Highest education level: Not on file  Occupational History   Not on file  Tobacco Use   Smoking status: Never   Smokeless tobacco: Never  Vaping Use   Vaping status: Never Used  Substance and Sexual Activity   Alcohol use: Not Currently    Comment: rarely   Drug use: No   Sexual activity: Yes    Birth control/protection: Surgical    Comment: Hyst, First IC <16y/o, <5 Partners  Other Topics Concern   Not on file  Social History Narrative   Not on file   Social Determinants of Health   Financial Resource Strain: Not on file  Food Insecurity: Not on file  Transportation Needs: Not on file  Physical Activity: Not on file  Stress: Not on file  Social Connections: Not on file  Intimate Partner Violence: Not on file    FAMILY HISTORY: Family History  Problem Relation Age of Onset   Diabetes Mother    Hypertension Mother    Stroke Mother    Heart disease Mother    Hypertension Father    Melanoma Father 73       metastasized   Breast cancer Cousin        dx. 40s     PHYSICAL EXAMINATION Physical Exam Constitutional:      Appearance: Normal appearance.  HENT:     Head: Normocephalic and atraumatic.  Chest:     Comments: Bilateral breasts inspected. No palpable masses or regional adenopathy. Musculoskeletal:        General: No swelling.  Skin:    General: Skin is warm and dry.  Neurological:     Mental Status: She is alert.      LABORATORY DATA: None for this visit   ASSESSMENT and THERAPY PLAN:   Malignant neoplasm of upper-outer quadrant  of right breast in female, estrogen receptor positive (HCC)  This is a very pleasant 67 year old postmenopausal female patient with no significant past medical history referred to medical oncology for new diagnosis of right breast invasive ductal carcinoma.  We have discussed about consideration for adjuvant chemotherapy given her tumor with high grade, high proliferation index of 90% on the original specimen, The tumor has many unfavorable features, including grade 3, ER weakly positive, PR negative, Ki 90%. We discussed about oncotype testing however given several unfavorable features, she is now receiving adjuvant docetaxel and cyclophosphamide.   She completed adjuvant chemotherapy on 01/09/2022. She completed adjuvant radiation on 6/27 She was tried on anti estrogen therapy with anastrozole around mid July. This was switched to letrozole Dec 2023. She is tolerating letrozole better than anastrozole.  PLAN  On Letrozole with mild side effects (leg cramps). No changes in breast exam. -Continue Letrozole. -Continue self-breast exams  General Health Maintenance Active lifestyle with walking and occasional 5Ks. Mild osteopenia. -Continue walking and consider adding strength training. -Continue Vitamin D supplementation and calcium-rich diet. -Schedule mammogram for January 3rd, 2025. -Follow-up in six months (Spring 2025).     Total time spent: 20 min  All questions were answered. The patient knows to call the clinic with any problems, questions or concerns. We can certainly see the patient much sooner if necessary.

## 2023-06-17 ENCOUNTER — Ambulatory Visit: Payer: BC Managed Care – PPO

## 2023-08-30 ENCOUNTER — Ambulatory Visit
Admission: RE | Admit: 2023-08-30 | Discharge: 2023-08-30 | Disposition: A | Discharge status: Home / Self Care | Payer: BC Managed Care – PPO | Source: Ambulatory Visit | Attending: Hematology and Oncology | Admitting: Hematology and Oncology

## 2023-08-30 DIAGNOSIS — C50411 Malignant neoplasm of upper-outer quadrant of right female breast: Secondary | ICD-10-CM

## 2023-09-30 ENCOUNTER — Ambulatory Visit: Payer: Medicare PPO | Attending: Surgery

## 2023-09-30 VITALS — Wt 160.5 lb

## 2023-09-30 DIAGNOSIS — Z483 Aftercare following surgery for neoplasm: Secondary | ICD-10-CM | POA: Insufficient documentation

## 2023-09-30 NOTE — Therapy (Signed)
OUTPATIENT PHYSICAL THERAPY SOZO SCREENING NOTE   Patient Name: Susan Benjamin MRN: [id number] DOB:Oct 15, 1955, 68 y.o., female Today's Date: 09/30/2023  PCP: [name], MD REFERRING PROVIDER: Manus Rudd, MD   PT End of Session - 09/30/23 [phone]     Visit Number 9   # unchanged due to screen only   PT Start Time 0942    PT Stop Time 0946    PT Time Calculation (min) 4 min    Activity Tolerance Patient tolerated treatment well    Behavior During Therapy WFL for tasks assessed/performed              Past Medical History:  Diagnosis Date   Anxiety    Cancer (HCC)    CIN I (cervical intraepithelial neoplasia I)    Ectopic pregnancy    X 2   Endometrial polyp    Personal history of radiation therapy    PONV (postoperative nausea and vomiting)    Seasonal allergies    Past Surgical History:  Procedure Laterality Date   BREAST LUMPECTOMY     BREAST LUMPECTOMY WITH RADIOACTIVE SEED AND SENTINEL LYMPH NODE BIOPSY Right 10/06/2021   Procedure: RIGHT BREAST LUMPECTOMY WITH RADIOACTIVE SEED AND SENTINEL LYMPH NODE BIOPSY;  Surgeon: Manus Rudd, MD;  Location: [facility];  Service: General;  Laterality: Right;   CARPAL TUNNEL RELEASE     CERVICAL BIOPSY  W/ LOOP ELECTRODE EXCISION  09/03/2001   DILATION AND CURETTAGE OF UTERUS  09/03/2009   ECTOPIC PREGNANCY SURGERY     X 2-Right and Left salpingectomy   FOOT SURGERY     HYSTEROSCOPY  09/03/2009   endometrial polyp   PORTACATH PLACEMENT Right 10/06/2021   Procedure: INSERTION PORT-A-CATH;  Surgeon: Manus Rudd, MD;  Location:  S[facility]ERY [facility];  Service: General;  Laterality: Right;   TUBAL LIGATION     tubal lig and tubal reversal   VAGINAL HYSTERECTOMY  09/03/2010   Patient Active Problem List   Diagnosis Date Noted   Muscle cramps 01/09/2022   Normocytic normochromic anemia 01/09/2022   Dysgeusia 12/19/2021   Genetic testing 10/09/2021   Malignant neoplasm of upper-outer quadrant  of right breast in female, estrogen receptor positive (HCC) 09/25/2021   Benign colon polyp 10/10/2012    REFERRING DIAG: right breast cancer at risk for lymphedema  THERAPY DIAG: Aftercare following surgery for neoplasm  PERTINENT HISTORY:  Patient was diagnosed on 09/19/2021 with right 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 She underwent a right lumpectomy and sentinel node biopsy (3 negative nodes) on 10/06/2021   PRECAUTIONS: right UE Lymphedema risk, None  SUBJECTIVE: Pt returns for her 3 month L-Dex screen.   PAIN:  Are you having pain? No  SOZO SCREENING: Patient was assessed today using the SOZO machine to determine the lymphedema index score. This was compared to her baseline score. It was determined that she is within the recommended range when compared to her baseline and no further action is needed at this time. She will continue SOZO screenings. These are done every 3 months for 2 years post operatively followed by every 6 months for 2 years, and then annually.   L-DEX FLOWSHEETS - 09/30/23 0900       L-DEX LYMPHEDEMA SCREENING   Measurement Type Unilateral    L-DEX MEASUREMENT EXTREMITY Upper Extremity    POSITION  Standing    DOMINANT SIDE Right    At Risk Side Right    BASELINE SCORE (UNILATERAL) -4.4    L-DEX  SCORE (UNILATERAL) -1.1    VALUE CHANGE (UNILAT) 3.3            P: Begin 6 month L-Dex screens next.     Hermenia Bers, PTA 09/30/2023, 9:45 AM

## 2023-10-28 ENCOUNTER — Telehealth: Payer: Self-pay

## 2023-10-28 ENCOUNTER — Telehealth: Payer: Self-pay | Admitting: *Deleted

## 2023-10-28 NOTE — Telephone Encounter (Signed)
 This RN spoke with pt per her call stating she has found a new area of "a knot" under her R arm - which is side of her hx of breast cancer.  "Not sure if I was to call your office to be seen or if I needed to call the [facility]"  Per discussion - noted pt last seen in Sept 2024 with next appt noted to be in Spring of 2025- appt not scheduled.  This RN scheduled pt for an opening tomorrow for visit for evaluation - pt in agreement to plan

## 2023-10-28 NOTE — Progress Notes (Unsigned)
 [facility] Cancer Follow up:    [name], MD [address] [place] [address]   DIAGNOSIS:  Cancer Staging  Malignant neoplasm of upper-outer quadrant of right breast in female, estrogen receptor positive (HCC) Staging form: Breast, AJCC 8th Edition - Clinical: Stage IB (cT1c, cN0, cM0, G3, ER+, PR-, HER2-) - Unsigned Stage prefix: Initial diagnosis Method of lymph node assessment: Clinical Histologic grading system: 3 grade system - Pathologic: Stage IIA (pT2, pN0, cM0, G3, ER+, PR-, HER2-) - Signed by Loa Socks, NP on 11/27/2021 Histologic grading system: 3 grade system   SUMMARY OF ONCOLOGIC HISTORY: Oncology History  Malignant neoplasm of upper-outer quadrant of right breast in female, estrogen receptor positive (HCC)  08/08/2021 Imaging   August 08, 2021, patient had bilateral screening mammogram which showed 2 possible masses in the right breast which warrant further evaluation.  No concerning findings in the left breast. She had diagnostic mammogram on 09/13/2021 which noted possible mass in the upper central breast at middle depth which persists on additional views is 1.9 cm oval mass with indistinct margins.  Second previously noted possible mass in the upper outer breast at anterior to middle depth dissipates on additional views consistent with overlapping fibroglandular tissue. Ultrasound showed suspicious 1.9 cm mass at the right breast 12 o'clock position for which ultrasound-guided biopsy was recommended no right axillary lymphadenopathy.    Genetic Testing   [facility] CancerNext-Expanded Panel was Negative. 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 Report date is 10/20/2021.  MSH6 VUS (p.N112S) was reclassified to likely benign. Report date is 11/21/2021.  The CancerNext-Expanded gene panel offered by [facility] and includes sequencing, rearrangement, and RNA analysis for the  following 77 genes: AIP, ALK, APC, ATM, AXIN2, BAP1, BARD1, BLM, BMPR1A, BRCA1, BRCA2, BRIP1, CDC73, CDH1, CDK4, CDKN1B, CDKN2A, CHEK2, CTNNA1, DICER1, FANCC, FH, FLCN, GALNT12, KIF1B, LZTR1, MAX, MEN1, MET, MLH1, MSH2, MSH3, MSH6, MUTYH, NBN, NF1, NF2, NTHL1, PALB2, PHOX2B, PMS2, POT1, PRKAR1A, PTCH1, PTEN, RAD51C, RAD51D, RB1, RECQL, RET, SDHA, SDHAF2, SDHB, SDHC, SDHD, SMAD4, SMARCA4, SMARCB1, SMARCE1, STK11, SUFU, TMEM127, TP53, TSC1, TSC2, VHL and XRCC2 (sequencing and deletion/duplication); EGFR, EGLN1, HOXB13, KIT, MITF, PDGFRA, POLD1, and POLE (sequencing only); EPCAM and GREM1 (deletion/duplication only).    10/06/2021 Surgery   A. BREAST, RIGHT, LUMPECTOMY:  -  Invasive carcinoma of no special type (ductal), grade3 (total  Nottingham score 9), 25 mm in greatest dimension.  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  Prognostics from initial biopsy showed ER +30%, weak staining, PR 0%, negative, Ki-67 of 90%.  HER2 negative by IHC.     11/06/2021 - 01/11/2022 Chemotherapy   Patient is on Treatment Plan : BREAST TC q21d     11/27/2021 Cancer Staging   Staging form: Breast, AJCC 8th Edition - Pathologic: Stage IIA (pT2, pN0, cM0, G3, ER+, PR-, HER2-) - Signed by Loa Socks, NP on 11/27/2021 Histologic grading system: 3 grade system   02/13/2022 - 03/13/2022 Radiation Therapy   Site Technique Total Dose (Gy) Dose per Fx (Gy) Completed Fx Beam Energies  Breast, Right: Breast_R 3D 42.56/42.56 2.66 16/16 6XFFF  Breast, Right: Breast_R_Bst 3D 8/8 2 4/4 6X, 10X     04/2022 -  Anti-estrogen oral therapy   Anastrozole     CURRENT THERAPY: letrozole  INTERVAL HISTORY:  Discussed the use of AI scribe software for clinical note transcription with the patient,  who gave verbal consent to proceed.  History of Present Illness      Patient Active Problem List   Diagnosis Date Noted   Muscle cramps 01/09/2022   Normocytic normochromic anemia  01/09/2022   Dysgeusia 12/19/2021   Genetic testing 10/09/2021   Malignant neoplasm of upper-outer quadrant of right breast in female, estrogen receptor positive (HCC) 09/25/2021   Benign colon polyp 10/10/2012    has no known allergies.  MEDICAL HISTORY: Past Medical History:  Diagnosis Date   Anxiety    Cancer (HCC)    CIN I (cervical intraepithelial neoplasia I)    Ectopic pregnancy    X 2   Endometrial polyp    Personal history of radiation therapy    PONV (postoperative nausea and vomiting)    Seasonal allergies     SURGICAL HISTORY: Past Surgical History:  Procedure Laterality Date   BREAST LUMPECTOMY     BREAST LUMPECTOMY WITH RADIOACTIVE SEED AND SENTINEL LYMPH NODE BIOPSY Right 10/06/2021   Procedure: RIGHT BREAST LUMPECTOMY WITH RADIOACTIVE SEED AND SENTINEL LYMPH NODE BIOPSY;  Surgeon: Manus Rudd, MD;  Location: [facility];  Service: General;  Laterality: Right;   CARPAL TUNNEL RELEASE     CERVICAL BIOPSY  W/ LOOP ELECTRODE EXCISION  09/03/2001   DILATION AND CURETTAGE OF UTERUS  09/03/2009   ECTOPIC PREGNANCY SURGERY     X 2-Right and Left salpingectomy   FOOT SURGERY     HYSTEROSCOPY  09/03/2009   endometrial polyp   PORTACATH PLACEMENT Right 10/06/2021   Procedure: INSERTION PORT-A-CATH;  Surgeon: Manus Rudd, MD;  Location: [facility];  Service: General;  Laterality: Right;   TUBAL LIGATION     tubal lig and tubal reversal   VAGINAL HYSTERECTOMY  09/03/2010    SOCIAL HISTORY: Social History   Socioeconomic History   Marital status: Married    Spouse name: Not on file   Number of children: Not on file   Years of education: Not on file   Highest education level: Not on file  Occupational History   Not on file  Tobacco Use   Smoking status: Never   Smokeless tobacco: Never  Vaping Use   Vaping status: Never Used  Substance and Sexual Activity   Alcohol use: Not Currently    Comment: rarely   Drug use: No    Sexual activity: Yes    Birth control/protection: Surgical    Comment: Hyst, First IC <16y/o, <5 Partners  Other Topics Concern   Not on file  Social History Narrative   Not on file   Social Drivers of Health   Financial Resource Strain: Not on file  Food Insecurity: Not on file  Transportation Needs: Not on file  Physical Activity: Not on file  Stress: Not on file  Social Connections: Not on file  Intimate Partner Violence: Not on file    FAMILY HISTORY: Family History  Problem Relation Age of Onset   Diabetes Mother    Hypertension Mother    Stroke Mother    Heart disease Mother    Hypertension Father    Melanoma Father 46       metastasized   Breast cancer Cousin        dx. 40s     PHYSICAL EXAMINATION Physical Exam Constitutional:      Appearance: Normal appearance.  HENT:     Head: Normocephalic and atraumatic.  Chest:     Comments: Bilateral breasts inspected. No palpable masses or regional  adenopathy. Musculoskeletal:        General: No swelling.  Skin:    General: Skin is warm and dry.  Neurological:     Mental Status: She is alert.      LABORATORY DATA: None for this visit   ASSESSMENT and THERAPY PLAN:   No problem-specific Assessment & Plan notes found for this encounter.   Total time spent: 20 min  All questions were answered. The patient knows to call the clinic with any problems, questions or concerns. We can certainly see the patient much sooner if necessary.

## 2023-10-28 NOTE — Assessment & Plan Note (Signed)
  This is a very pleasant 68 year old postmenopausal female patient with no significant past medical history referred to medical oncology for new diagnosis of right breast invasive ductal carcinoma.  We have discussed about consideration for adjuvant chemotherapy given her tumor with high grade, high proliferation index of 90% on the original specimen, The tumor has many unfavorable features, including grade 3, ER weakly positive, PR negative, Ki 90%. We discussed about oncotype testing however given several unfavorable features, she is now receiving adjuvant docetaxel and cyclophosphamide.   She completed adjuvant chemotherapy on 01/09/2022. She completed adjuvant radiation on 6/27 She was tried on anti estrogen therapy with anastrozole around mid July. This was switched to letrozole Dec 2023. She is tolerating letrozole better than anastrozole.  PLAN

## 2023-10-28 NOTE — Telephone Encounter (Signed)
 Spoke with patient and confirmed visit for 10/29/23

## 2023-10-29 ENCOUNTER — Encounter: Payer: Self-pay | Admitting: Hematology and Oncology

## 2023-10-29 ENCOUNTER — Inpatient Hospital Stay: Payer: Medicare PPO | Attending: Hematology and Oncology | Admitting: Hematology and Oncology

## 2023-10-29 ENCOUNTER — Other Ambulatory Visit: Payer: Self-pay | Admitting: Adult Health

## 2023-10-29 VITALS — BP 138/75 | HR 80 | Temp 97.2°F | Resp 16 | Wt 160.3 lb

## 2023-10-29 DIAGNOSIS — Z1732 Human epidermal growth factor receptor 2 negative status: Secondary | ICD-10-CM | POA: Insufficient documentation

## 2023-10-29 DIAGNOSIS — Z808 Family history of malignant neoplasm of other organs or systems: Secondary | ICD-10-CM | POA: Diagnosis not present

## 2023-10-29 DIAGNOSIS — Z9221 Personal history of antineoplastic chemotherapy: Secondary | ICD-10-CM | POA: Diagnosis not present

## 2023-10-29 DIAGNOSIS — Z803 Family history of malignant neoplasm of breast: Secondary | ICD-10-CM | POA: Diagnosis not present

## 2023-10-29 DIAGNOSIS — C50411 Malignant neoplasm of upper-outer quadrant of right female breast: Secondary | ICD-10-CM | POA: Diagnosis present

## 2023-10-29 DIAGNOSIS — Z170 Estrogen receptor positive status [ER+]: Secondary | ICD-10-CM | POA: Diagnosis not present

## 2023-10-29 DIAGNOSIS — Z923 Personal history of irradiation: Secondary | ICD-10-CM | POA: Diagnosis not present

## 2023-10-29 DIAGNOSIS — Z1722 Progesterone receptor negative status: Secondary | ICD-10-CM | POA: Insufficient documentation

## 2023-10-29 DIAGNOSIS — Z79811 Long term (current) use of aromatase inhibitors: Secondary | ICD-10-CM | POA: Insufficient documentation

## 2023-10-30 ENCOUNTER — Ambulatory Visit
Admission: RE | Admit: 2023-10-30 | Discharge: 2023-10-30 | Disposition: A | Discharge status: Home / Self Care | Payer: Medicare PPO | Source: Ambulatory Visit | Attending: Hematology and Oncology | Admitting: Hematology and Oncology

## 2023-10-30 ENCOUNTER — Ambulatory Visit
Admission: RE | Admit: 2023-10-30 | Discharge: 2023-10-30 | Discharge status: Home / Self Care | Payer: Medicare PPO | Source: Ambulatory Visit | Attending: Hematology and Oncology

## 2023-10-30 ENCOUNTER — Other Ambulatory Visit: Payer: Self-pay | Admitting: Hematology and Oncology

## 2023-10-30 DIAGNOSIS — Z170 Estrogen receptor positive status [ER+]: Secondary | ICD-10-CM

## 2023-10-30 DIAGNOSIS — N6489 Other specified disorders of breast: Secondary | ICD-10-CM

## 2023-10-30 HISTORY — DX: Personal history of antineoplastic chemotherapy: Z92.21

## 2023-11-06 ENCOUNTER — Ambulatory Visit
Admission: RE | Admit: 2023-11-06 | Discharge: 2023-11-06 | Disposition: A | Discharge status: Home / Self Care | Source: Ambulatory Visit | Attending: Hematology and Oncology

## 2023-11-06 ENCOUNTER — Ambulatory Visit
Admission: RE | Admit: 2023-11-06 | Discharge: 2023-11-06 | Disposition: A | Discharge status: Home / Self Care | Payer: Medicare PPO | Source: Ambulatory Visit | Attending: Hematology and Oncology | Admitting: Hematology and Oncology

## 2023-11-06 DIAGNOSIS — C50411 Malignant neoplasm of upper-outer quadrant of right female breast: Secondary | ICD-10-CM

## 2023-11-06 DIAGNOSIS — N6489 Other specified disorders of breast: Secondary | ICD-10-CM

## 2023-11-06 HISTORY — PX: BREAST BIOPSY: SHX20

## 2023-11-08 LAB — SURGICAL PATHOLOGY

## 2023-11-13 NOTE — Progress Notes (Unsigned)
 [facility] Cancer Follow up:    [name], MD [address] [place] [address]   DIAGNOSIS:  Cancer Staging  Malignant neoplasm of upper-outer quadrant of right breast in female, estrogen receptor positive (HCC) Staging form: Breast, AJCC 8th Edition - Clinical: Stage IB (cT1c, cN0, cM0, G3, ER+, PR-, HER2-) - Signed by Rachel Moulds, MD on 10/28/2023 Stage prefix: Initial diagnosis Method of lymph node assessment: Clinical Histologic grading system: 3 grade system - Pathologic: Stage IIA (pT2, pN0, cM0, G3, ER+, PR-, HER2-) - Signed by Loa Socks, NP on 11/27/2021 Histologic grading system: 3 grade system   SUMMARY OF ONCOLOGIC HISTORY: Oncology History  Malignant neoplasm of upper-outer quadrant of right breast in female, estrogen receptor positive (HCC)  08/08/2021 Imaging   August 08, 2021, patient had bilateral screening mammogram which showed 2 possible masses in the right breast which warrant further evaluation.  No concerning findings in the left breast. She had diagnostic mammogram on 09/13/2021 which noted possible mass in the upper central breast at middle depth which persists on additional views is 1.9 cm oval mass with indistinct margins.  Second previously noted possible mass in the upper outer breast at anterior to middle depth dissipates on additional views consistent with overlapping fibroglandular tissue. Ultrasound showed suspicious 1.9 cm mass at the right breast 12 o'clock position for which ultrasound-guided biopsy was recommended no right axillary lymphadenopathy.    Genetic Testing   [facility] CancerNext-Expanded Panel was Negative. 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 Report date is 10/20/2021.  MSH6 VUS (p.N112S) was reclassified to likely benign. Report date is 11/21/2021.  The CancerNext-Expanded gene panel offered by [facility] and includes sequencing,  rearrangement, and RNA analysis for the following 77 genes: AIP, ALK, APC, ATM, AXIN2, BAP1, BARD1, BLM, BMPR1A, BRCA1, BRCA2, BRIP1, CDC73, CDH1, CDK4, CDKN1B, CDKN2A, CHEK2, CTNNA1, DICER1, FANCC, FH, FLCN, GALNT12, KIF1B, LZTR1, MAX, MEN1, MET, MLH1, MSH2, MSH3, MSH6, MUTYH, NBN, NF1, NF2, NTHL1, PALB2, PHOX2B, PMS2, POT1, PRKAR1A, PTCH1, PTEN, RAD51C, RAD51D, RB1, RECQL, RET, SDHA, SDHAF2, SDHB, SDHC, SDHD, SMAD4, SMARCA4, SMARCB1, SMARCE1, STK11, SUFU, TMEM127, TP53, TSC1, TSC2, VHL and XRCC2 (sequencing and deletion/duplication); EGFR, EGLN1, HOXB13, KIT, MITF, PDGFRA, POLD1, and POLE (sequencing only); EPCAM and GREM1 (deletion/duplication only).    09/27/2021 Cancer Staging   Staging form: Breast, AJCC 8th Edition - Clinical: Stage IB (cT1c, cN0, cM0, G3, ER+, PR-, HER2-) - Signed by Rachel Moulds, MD on 10/28/2023 Stage prefix: Initial diagnosis Method of lymph node assessment: Clinical Histologic grading system: 3 grade system   10/06/2021 Surgery   A. BREAST, RIGHT, LUMPECTOMY:  -  Invasive carcinoma of no special type (ductal), grade3 (total  Nottingham score 9), 25 mm in greatest dimension.  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  Prognostics from initial biopsy showed ER +30%, weak staining, PR 0%, negative, Ki-67 of 90%.  HER2 negative by IHC.     11/06/2021 - 01/11/2022 Chemotherapy   Patient is on Treatment Plan : BREAST TC q21d     11/27/2021 Cancer Staging   Staging form: Breast, AJCC 8th Edition - Pathologic: Stage IIA (pT2, pN0, cM0, G3, ER+, PR-, HER2-) - Signed by Loa Socks, NP on 11/27/2021 Histologic grading system: 3 grade system   02/13/2022 - 03/13/2022 Radiation Therapy   Site Technique Total Dose (Gy) Dose per Fx (Gy) Completed Fx Beam Energies  Breast, Right:  Breast_R 3D 42.56/42.56 2.66 16/16 6XFFF  Breast, Right: Breast_R_Bst 3D 8/8 2 4/4 6X, 10X     04/2022 -  Anti-estrogen oral therapy   Anastrozole      CURRENT THERAPY: letrozole  INTERVAL HISTORY:  Discussed the use of AI scribe software for clinical note transcription with the patient, who gave verbal consent to proceed.  History of Present Illness    Susan Benjamin is a 68 year old female with a history of breast cancer who presents for interim follow up given a palpable lump. She had a mammogram and [place], needle core biopsy which showed no evidence of malignancy.  She is here for a telephone visit to review the same. She feels so much relieved knowing this information. She continues on letrozole daily.  Rest of the pertinent 10 point ROS reviewed and neg.  Patient Active Problem List   Diagnosis Date Noted   Muscle cramps 01/09/2022   Normocytic normochromic anemia 01/09/2022   Dysgeusia 12/19/2021   Genetic testing 10/09/2021   Malignant neoplasm of upper-outer quadrant of right breast in female, estrogen receptor positive (HCC) 09/25/2021   Benign colon polyp 10/10/2012    has no known allergies.  MEDICAL HISTORY: Past Medical History:  Diagnosis Date   Anxiety    Cancer (HCC)    CIN I (cervical intraepithelial neoplasia I)    Ectopic pregnancy    X 2   Endometrial polyp    Personal history of chemotherapy    Personal history of radiation therapy    PONV (postoperative nausea and vomiting)    Seasonal allergies     SURGICAL HISTORY: Past Surgical History:  Procedure Laterality Date   BREAST BIOPSY Right 11/06/2023   MM RT BREAST BX W LOC DEV 1ST LESION IMAGE BX SPEC STEREO GUIDE 11/06/2023 [facility] MAMMOGRAPHY   BREAST LUMPECTOMY     BREAST LUMPECTOMY WITH RADIOACTIVE SEED AND SENTINEL LYMPH NODE BIOPSY Right 10/06/2021   Procedure: RIGHT BREAST LUMPECTOMY WITH RADIOACTIVE SEED AND SENTINEL LYMPH NODE BIOPSY;  Surgeon: Manus Rudd, MD;  Location: [facility];  Service: General;  Laterality: Right;   CARPAL TUNNEL RELEASE     CERVICAL BIOPSY  W/ LOOP ELECTRODE EXCISION  09/03/2001   DILATION  AND CURETTAGE OF UTERUS  09/03/2009   ECTOPIC PREGNANCY SURGERY     X 2-Right and Left salpingectomy   FOOT SURGERY     HYSTEROSCOPY  09/03/2009   endometrial polyp   PORTACATH PLACEMENT Right 10/06/2021   Procedure: INSERTION PORT-A-CATH;  Surgeon: Manus Rudd, MD;  Location: [facility];  Service: General;  Laterality: Right;   TUBAL LIGATION     tubal lig and tubal reversal   VAGINAL HYSTERECTOMY  09/03/2010    SOCIAL HISTORY: Social History   Socioeconomic History   Marital status: Married    Spouse name: Not on file   Number of children: Not on file   Years of education: Not on file   Highest education level: Not on file  Occupational History   Not on file  Tobacco Use   Smoking status: Never   Smokeless tobacco: Never  Vaping Use   Vaping status: Never Used  Substance and Sexual Activity   Alcohol use: Not Currently    Comment: rarely   Drug use: No   Sexual activity: Yes    Birth control/protection: Surgical    Comment: Hyst, First IC <16y/o, <5 Partners  Other Topics Concern   Not on file  Social History Narrative  Not on file   Social Drivers of Health   Financial Resource Strain: Not on file  Food Insecurity: Not on file  Transportation Needs: Not on file  Physical Activity: Not on file  Stress: Not on file  Social Connections: Not on file  Intimate Partner Violence: Not on file    FAMILY HISTORY: Family History  Problem Relation Age of Onset   Diabetes Mother    Hypertension Mother    Stroke Mother    Heart disease Mother    Hypertension Father    Melanoma Father 69       metastasized   Breast cancer Cousin        dx. 40s     PHYSICAL EXAMINATION Physical Exam Constitutional:      Appearance: Normal appearance.  HENT:     Head: Normocephalic and atraumatic.  Chest:     Comments: Bilateral breasts inspected. No palpable masses or regional adenopathy. Musculoskeletal:        General: No swelling.  Skin:     General: Skin is warm and dry.  Neurological:     Mental Status: She is alert.      LABORATORY DATA: None for this visit   ASSESSMENT and THERAPY PLAN:   Malignant neoplasm of upper-outer quadrant of right breast in female, estrogen receptor positive (HCC) This is a very pleasant 68 year old postmenopausal female patient with no significant past medical history of right breast IDC s.p surgery, adj chemo and adj radiation, now here for follow up. She is on adj letrozole.  Breast Mass New palpable mass in the right breast at 3 o'clock position, noticed approximately 2 weeks ago. The mass is mobile and tender to touch. Recent mammogram in December was unremarkable.  We repeated mammogram, [place] and had a needle biopsy, biopsy showed fibrocystic changes, no evidence of malignancy. She is very relieved by this information.  I have recommended that she continue letrozole and come back for the scheduled appt. she is currently encouraged to call us with any new questions or concerns.  I connected with  Susan Benjamin on 11/14/23 by a telephone application and verified that I am speaking with the correct person using two identifiers.   I discussed the limitations of evaluation and management by telemedicine. The patient expressed understanding and agreed to proceed.  Time spent: 10 min  Location of provider: Office Location of patient: Home.   All questions were answered. The patient knows to call the clinic with any problems, questions or concerns. We can certainly see the patient much sooner if necessary.

## 2023-11-14 ENCOUNTER — Inpatient Hospital Stay: Payer: Medicare PPO | Attending: Hematology and Oncology | Admitting: Hematology and Oncology

## 2023-11-14 DIAGNOSIS — C50411 Malignant neoplasm of upper-outer quadrant of right female breast: Secondary | ICD-10-CM | POA: Diagnosis not present

## 2023-11-14 DIAGNOSIS — Z170 Estrogen receptor positive status [ER+]: Secondary | ICD-10-CM | POA: Diagnosis not present

## 2023-11-14 NOTE — Assessment & Plan Note (Signed)
 This is a very pleasant 68 year old postmenopausal female patient with no significant past medical history of right breast IDC s.p surgery, adj chemo and adj radiation, now here for follow up. She is on adj letrozole.  Breast Mass New palpable mass in the right breast at 3 o'clock position, noticed approximately 2 weeks ago. The mass is mobile and tender to touch. Recent mammogram in December was unremarkable.  We repeated mammogram, [place] and had a needle biopsy, biopsy showed fibrocystic changes, no evidence of malignancy. She is very relieved by this information.  I have recommended that she continue letrozole and come back for the scheduled appt. she is currently encouraged to call us with any new questions or concerns.  I connected with  Susan Benjamin on 11/14/23 by a telephone application and verified that I am speaking with the correct person using two identifiers.   I discussed the limitations of evaluation and management by telemedicine. The patient expressed understanding and agreed to proceed.  Time spent: 10 min  Location of provider: Office Location of patient: Home.

## 2024-03-30 ENCOUNTER — Ambulatory Visit: Payer: Medicare PPO | Attending: Surgery

## 2024-03-30 VITALS — Wt 163.2 lb

## 2024-03-30 DIAGNOSIS — Z483 Aftercare following surgery for neoplasm: Secondary | ICD-10-CM | POA: Insufficient documentation

## 2024-03-30 NOTE — Therapy (Signed)
 OUTPATIENT PHYSICAL THERAPY SOZO SCREENING NOTE   Patient Name: Susan Benjamin MRN: [id number] DOB:April 10, 1956, 68 y.o., female Today's Date: 03/30/2024  PCP: Gib Charleston, MD REFERRING PROVIDER: Belinda Cough, MD   PT End of Session - 03/30/24 1035     Visit Number 9   # unchanged due to screen only   PT Start Time 1033    PT Stop Time 1037    PT Time Calculation (min) 4 min    Activity Tolerance Patient tolerated treatment well    Behavior During Therapy WFL for tasks assessed/performed           Past Medical History:  Diagnosis Date   Anxiety    Cancer (HCC)    CIN I (cervical intraepithelial neoplasia I)    Ectopic pregnancy    X 2   Endometrial polyp    Personal history of chemotherapy    Personal history of radiation therapy    PONV (postoperative nausea and vomiting)    Seasonal allergies    Past Surgical History:  Procedure Laterality Date   BREAST BIOPSY Right 11/06/2023   MM RT BREAST BX W LOC DEV 1ST LESION IMAGE BX SPEC STEREO GUIDE 11/06/2023 [facility] MAMMOGRAPHY   BREAST LUMPECTOMY     BREAST LUMPECTOMY WITH RADIOACTIVE SEED AND SENTINEL LYMPH NODE BIOPSY Right 10/06/2021   Procedure: RIGHT BREAST LUMPECTOMY WITH RADIOACTIVE SEED AND SENTINEL LYMPH NODE BIOPSY;  Surgeon: Belinda Cough, MD;  Location: [facility];  Service: General;  Laterality: Right;   CARPAL TUNNEL RELEASE     CERVICAL BIOPSY  W/ LOOP ELECTRODE EXCISION  09/03/2001   DILATION AND CURETTAGE OF UTERUS  09/03/2009   ECTOPIC PREGNANCY SURGERY     X 2-Right and Left salpingectomy   FOOT SURGERY     HYSTEROSCOPY  09/03/2009   endometrial polyp   PORTACATH PLACEMENT Right 10/06/2021   Procedure: INSERTION PORT-A-CATH;  Surgeon: Belinda Cough, MD;  Location: [facility];  Service: General;  Laterality: Right;   TUBAL LIGATION     tubal lig and tubal reversal   VAGINAL HYSTERECTOMY  09/03/2010   Patient Active Problem List   Diagnosis Date Noted   Muscle  cramps 01/09/2022   Normocytic normochromic anemia 01/09/2022   Dysgeusia 12/19/2021   Genetic testing 10/09/2021   Malignant neoplasm of upper-outer quadrant of right breast in female, estrogen receptor positive (HCC) 09/25/2021   Benign colon polyp 10/10/2012    REFERRING DIAG: right breast cancer at risk for lymphedema  THERAPY DIAG: Aftercare following surgery for neoplasm  PERTINENT HISTORY:  Patient was diagnosed on 09/19/2021 with right 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 She underwent a right lumpectomy and sentinel node biopsy (3 negative nodes) on 10/06/2021   PRECAUTIONS: right UE Lymphedema risk, None  SUBJECTIVE: Pt returns for her first 6 month L-Dex screen.   PAIN:  Are you having pain? No  SOZO SCREENING: Patient was assessed today using the SOZO machine to determine the lymphedema index score. This was compared to her baseline score. It was determined that she is within the recommended range when compared to her baseline and no further action is needed at this time. She will continue SOZO screenings. These are done every 3 months for 2 years post operatively followed by every 6 months for 2 years, and then annually.   L-DEX FLOWSHEETS - 03/30/24 1000       L-DEX LYMPHEDEMA SCREENING   Measurement Type Unilateral    L-DEX MEASUREMENT EXTREMITY Upper Extremity  POSITION  Standing    DOMINANT SIDE Right    At Risk Side Right    BASELINE SCORE (UNILATERAL) -4.4    L-DEX SCORE (UNILATERAL) -1.4    VALUE CHANGE (UNILAT) 3         P: Cont 6 month L-Dex screens next.     Aden Berwyn Caldron, PTA 03/30/2024, 10:36 AM

## 2024-04-27 ENCOUNTER — Inpatient Hospital Stay: Payer: Medicare PPO | Attending: Hematology and Oncology | Admitting: Hematology and Oncology

## 2024-04-27 VITALS — BP 143/90 | HR 72 | Temp 98.4°F | Resp 14 | Wt 164.3 lb

## 2024-04-27 DIAGNOSIS — Z79811 Long term (current) use of aromatase inhibitors: Secondary | ICD-10-CM | POA: Diagnosis not present

## 2024-04-27 DIAGNOSIS — Z1732 Human epidermal growth factor receptor 2 negative status: Secondary | ICD-10-CM | POA: Insufficient documentation

## 2024-04-27 DIAGNOSIS — Z9221 Personal history of antineoplastic chemotherapy: Secondary | ICD-10-CM | POA: Insufficient documentation

## 2024-04-27 DIAGNOSIS — M8580 Other specified disorders of bone density and structure, unspecified site: Secondary | ICD-10-CM | POA: Insufficient documentation

## 2024-04-27 DIAGNOSIS — Z923 Personal history of irradiation: Secondary | ICD-10-CM | POA: Insufficient documentation

## 2024-04-27 DIAGNOSIS — Z1722 Progesterone receptor negative status: Secondary | ICD-10-CM | POA: Insufficient documentation

## 2024-04-27 DIAGNOSIS — Z803 Family history of malignant neoplasm of breast: Secondary | ICD-10-CM | POA: Diagnosis not present

## 2024-04-27 DIAGNOSIS — C50411 Malignant neoplasm of upper-outer quadrant of right female breast: Secondary | ICD-10-CM | POA: Insufficient documentation

## 2024-04-27 DIAGNOSIS — Z808 Family history of malignant neoplasm of other organs or systems: Secondary | ICD-10-CM | POA: Insufficient documentation

## 2024-04-27 DIAGNOSIS — Z170 Estrogen receptor positive status [ER+]: Secondary | ICD-10-CM | POA: Diagnosis not present

## 2024-04-27 NOTE — Assessment & Plan Note (Signed)
 This is a very pleasant 68 year old postmenopausal female patient with no significant past medical history of right breast IDC s.p surgery, adj chemo and adj radiation, now here for follow up. She is on adj letrozole .  Assessment and Plan Assessment & Plan Breast cancer on adjuvant letrozole  therapy Breast cancer managed with adjuvant letrozole . No side effects reported. Previous anastrozole  issues resolved with letrozole  switch. - Order mammogram for February. - Provide pamphlet on Guardant Reveal test for breast cancer recurrence. - No concerns on exam today.  Osteopenia Mild osteopenia present. - Encourage regular walking. - Continue vitamin D supplementation. - Advise calcium supplementation if dietary intake is insufficient.

## 2024-04-27 NOTE — Progress Notes (Signed)
 Susan Benjamin:    Susan Charleston, MD [address] Susan Benjamin [address] [place] Susan Benjamin   DIAGNOSIS:  Cancer Staging  Malignant neoplasm of upper-outer quadrant of right breast in female, estrogen receptor positive (HCC) Staging form: Breast, AJCC 8th Edition - Clinical: Stage IB (cT1c, cN0, cM0, G3, ER+, PR-, HER2-) - Signed by Susan Ash, MD on 10/28/2023 Stage prefix: Initial diagnosis Method of lymph node assessment: Clinical Histologic grading system: 3 grade system - Pathologic: Stage IIA (pT2, pN0, cM0, G3, ER+, PR-, HER2-) - Signed by Susan Morna Pickle, NP on 11/27/2021 Histologic grading system: 3 grade system   SUMMARY OF ONCOLOGIC HISTORY: Oncology History  Malignant neoplasm of upper-outer quadrant of right breast in female, estrogen receptor positive (HCC)  08/08/2021 Imaging   August 08, 2021, patient had bilateral screening mammogram which showed 2 possible masses in the right breast which warrant further evaluation.  No concerning findings in the left breast. She had diagnostic mammogram on 09/13/2021 which noted possible mass in the upper central breast at middle depth which persists on additional views is 1.9 cm oval mass with indistinct margins.  Second previously noted possible mass in the upper outer breast at anterior to middle depth dissipates on additional views consistent with overlapping fibroglandular tissue. Ultrasound showed suspicious 1.9 cm mass at the right breast 12 o'clock position for which ultrasound-guided biopsy was recommended no right axillary lymphadenopathy.    Genetic Testing   [facility] CancerNext-Expanded Panel was Negative. 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 Report date is 10/20/2021.  MSH6 VUS (p.N112S) was reclassified to likely benign. Report date is 11/21/2021.  The CancerNext-Expanded gene panel offered by [facility] and includes sequencing,  rearrangement, and RNA analysis for the following 77 genes: AIP, ALK, APC, ATM, AXIN2, BAP1, BARD1, BLM, BMPR1A, BRCA1, BRCA2, BRIP1, CDC73, CDH1, CDK4, CDKN1B, CDKN2A, CHEK2, CTNNA1, DICER1, FANCC, FH, FLCN, GALNT12, KIF1B, LZTR1, MAX, MEN1, MET, MLH1, MSH2, MSH3, MSH6, MUTYH, NBN, NF1, NF2, NTHL1, PALB2, PHOX2B, PMS2, POT1, PRKAR1A, PTCH1, PTEN, RAD51C, RAD51D, RB1, RECQL, RET, SDHA, SDHAF2, SDHB, SDHC, SDHD, SMAD4, SMARCA4, SMARCB1, SMARCE1, STK11, SUFU, TMEM127, TP53, TSC1, TSC2, VHL and XRCC2 (sequencing and deletion/duplication); EGFR, EGLN1, HOXB13, KIT, MITF, PDGFRA, POLD1, and POLE (sequencing only); EPCAM and GREM1 (deletion/duplication only).    09/27/2021 Cancer Staging   Staging form: Breast, AJCC 8th Edition - Clinical: Stage IB (cT1c, cN0, cM0, G3, ER+, PR-, HER2-) - Signed by Susan Ash, MD on 10/28/2023 Stage prefix: Initial diagnosis Method of lymph node assessment: Clinical Histologic grading system: 3 grade system   10/06/2021 Surgery   A. BREAST, RIGHT, LUMPECTOMY:  -  Invasive carcinoma of no special type (ductal), grade3 (total  Nottingham score 9), 25 mm in greatest dimension.  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  Prognostics from initial biopsy showed ER +30%, weak staining, PR 0%, negative, Ki-67 of 90%.  HER2 negative by IHC.     11/06/2021 - 01/11/2022 Chemotherapy   Patient is on Treatment Plan : BREAST TC q21d     11/27/2021 Cancer Staging   Staging form: Breast, AJCC 8th Edition - Pathologic: Stage IIA (pT2, pN0, cM0, G3, ER+, PR-, HER2-) - Signed by Susan Morna Pickle, NP on 11/27/2021 Histologic grading system: 3 grade system   02/13/2022 - 03/13/2022 Radiation Therapy   Site Technique Total Dose (Gy) Dose per Fx (Gy) Completed Fx Beam Energies  Breast, Right:  Breast_R 3D 42.56/42.56 2.66 16/16 6XFFF  Breast, Right: Breast_R_Bst 3D 8/8 2 4/4 6X, 10X     04/2022 -  Anti-estrogen oral therapy   Anastrozole       CURRENT THERAPY: letrozole   INTERVAL HISTORY:  Discussed the use of AI scribe software for clinical note transcription with the patient, who gave verbal consent to proceed.  History of Present Illness Susan Benjamin is a 68 year old female with breast cancer who presents for routine follow-Benjamin.  She is currently taking letrozole  and vitamin D3 without experiencing any side effects from letrozole . She previously tried anastrozole  but did not tolerate it well. She is focused on improving her diet and losing weight, and has been exercising regularly. Her hair has regrown, though it remains slightly thin. She recently traveled to [place] with her husband for their thirtieth wedding anniversary, staying at the [name]. She arranged for a family member to stay with her mother-in-law during their trip.  Rest of the pertinent 10 point ROS reviewed and neg.  Patient Active Problem List   Diagnosis Date Noted   Muscle cramps 01/09/2022   Normocytic normochromic anemia 01/09/2022   Dysgeusia 12/19/2021   Genetic testing 10/09/2021   Malignant neoplasm of upper-outer quadrant of right breast in female, estrogen receptor positive (HCC) 09/25/2021   Benign colon polyp 10/10/2012    has no known allergies.  MEDICAL HISTORY: Past Medical History:  Diagnosis Date   Anxiety    Cancer (HCC)    CIN I (cervical intraepithelial neoplasia I)    Ectopic pregnancy    X 2   Endometrial polyp    Personal history of chemotherapy    Personal history of radiation therapy    PONV (postoperative nausea and vomiting)    Seasonal allergies     SURGICAL HISTORY: Past Surgical History:  Procedure Laterality Date   BREAST BIOPSY Right 11/06/2023   MM RT BREAST BX W LOC DEV 1ST LESION IMAGE BX SPEC STEREO GUIDE 11/06/2023 [facility]MO[facility]   BREAST LUMPECTOMY     BREAST LUMPECTOMY WITH RADIOACTIVE SEED AND SENTINEL LYMPH NODE BIOPSY Right 10/06/2021   Procedure: RIGHT BREAST LUMPECTOMY WITH  RADIOACTIVE SEED AND SENTINEL LYMPH NODE BIOPSY;  Surgeon: Belinda Cough, MD;  Location: [facility];  Service: General;  Laterality: Right;   CARPAL TUNNEL RELEASE     CERVICAL BIOPSY  W/ LOOP ELECTRODE EXCISION  09/03/2001   DILATION AND CURETTAGE OF UTERUS  09/03/2009   ECTOPIC PREGNANCY SURGERY     X 2-Right and Left salpingectomy   FOOT SURGERY     HYSTEROSCOPY  09/03/2009   endometrial polyp   PORTACATH PLACEMENT Right 10/06/2021   Procedure: INSERTION PORT-A-CATH;  Surgeon: Belinda Cough, MD;  Location: [facility];  Service: General;  Laterality: Right;   TUBAL LIGATION     tubal lig and tubal reversal   VAGINAL HYSTERECTOMY  09/03/2010    SOCIAL HISTORY: Social History   Socioeconomic History   Marital status: Married    Spouse name: Not on file   Number of children: Not on file   Years of education: Not on file   Highest education level: Not on file  Occupational History   Not on file  Tobacco Use   Smoking status: Never   Smokeless tobacco: Never  Vaping Use   Vaping status: Never Used  Substance and Sexual Activity   Alcohol use: Not Currently    Comment: rarely   Drug use: No   Sexual activity:  Yes    Birth control/protection: Surgical    Comment: Hyst, First IC <16y/o, <5 Partners  Other Topics Concern   Not on file  Social History Narrative   Not on file   Social Drivers of Health   Financial Resource Strain: Not on file  Food Insecurity: Not on file  Transportation Needs: Not on file  Physical Activity: Not on file  Stress: Not on file  Social Connections: Not on file  Intimate Partner Violence: Not on file    FAMILY HISTORY: Family History  Problem Relation Age of Onset   Diabetes Mother    Hypertension Mother    Stroke Mother    Heart disease Mother    Hypertension Father    Melanoma Father 53       metastasized   Breast cancer Cousin        dx. 40s     PHYSICAL EXAMINATION Physical  Exam Constitutional:      Appearance: Normal appearance.  HENT:     Head: Normocephalic and atraumatic.  Chest:     Comments: Bilateral breasts inspected. No palpable masses or regional adenopathy. Musculoskeletal:        General: No swelling.  Skin:    General: Skin is warm and dry.  Neurological:     Mental Status: She is alert.      LABORATORY DATA: None for this visit   ASSESSMENT and THERAPY PLAN:   Malignant neoplasm of upper-outer quadrant of right breast in female, estrogen receptor positive (HCC) This is a very pleasant 68 year old postmenopausal female patient with no significant past medical history of right breast IDC s.p surgery, adj chemo and adj radiation, now here for follow Benjamin. She is on adj letrozole .  Assessment and Plan Assessment & Plan Breast cancer on adjuvant letrozole  therapy Breast cancer managed with adjuvant letrozole . No side effects reported. Previous anastrozole  issues resolved with letrozole  switch. - Order mammogram for February. - Provide pamphlet on Guardant Reveal test for breast cancer recurrence. - No concerns on exam today.  Osteopenia Mild osteopenia present. - Encourage regular walking. - Continue vitamin D supplementation. - Advise calcium supplementation if dietary intake is insufficient.    All questions were answered. The patient knows to call the clinic with any problems, questions or concerns. We can certainly see the patient much sooner if necessary.

## 2024-08-31 ENCOUNTER — Ambulatory Visit
Admission: RE | Admit: 2024-08-31 | Discharge: 2024-08-31 | Disposition: A | Source: Ambulatory Visit | Attending: Hematology and Oncology

## 2024-08-31 DIAGNOSIS — Z170 Estrogen receptor positive status [ER+]: Secondary | ICD-10-CM

## 2024-09-18 ENCOUNTER — Other Ambulatory Visit: Payer: Self-pay | Admitting: Adult Health

## 2024-09-18 DIAGNOSIS — C50411 Malignant neoplasm of upper-outer quadrant of right female breast: Secondary | ICD-10-CM

## 2024-09-28 ENCOUNTER — Ambulatory Visit

## 2024-10-30 ENCOUNTER — Ambulatory Visit: Admitting: Hematology and Oncology

## 2024-11-09 ENCOUNTER — Ambulatory Visit
